# Patient Record
Sex: Male | Born: 1940
Health system: Southern US, Community
[De-identification: ages and names within clinical notes are randomized; demographics above are authoritative.]

## PROBLEM LIST (undated history)

## (undated) DIAGNOSIS — G709 Myoneural disorder, unspecified: Secondary | ICD-10-CM

## (undated) DIAGNOSIS — G4730 Sleep apnea, unspecified: Secondary | ICD-10-CM

## (undated) DIAGNOSIS — F0390 Unspecified dementia without behavioral disturbance: Secondary | ICD-10-CM

## (undated) DIAGNOSIS — H02409 Unspecified ptosis of unspecified eyelid: Secondary | ICD-10-CM

## (undated) DIAGNOSIS — R42 Dizziness and giddiness: Secondary | ICD-10-CM

## (undated) DIAGNOSIS — G629 Polyneuropathy, unspecified: Secondary | ICD-10-CM

## (undated) DIAGNOSIS — I10 Essential (primary) hypertension: Secondary | ICD-10-CM

## (undated) DIAGNOSIS — E785 Hyperlipidemia, unspecified: Secondary | ICD-10-CM

## (undated) DIAGNOSIS — E079 Disorder of thyroid, unspecified: Secondary | ICD-10-CM

## (undated) DIAGNOSIS — K219 Gastro-esophageal reflux disease without esophagitis: Secondary | ICD-10-CM

## (undated) DIAGNOSIS — R531 Weakness: Secondary | ICD-10-CM

## (undated) HISTORY — DX: Sleep apnea, unspecified: G47.30

## (undated) HISTORY — PX: TOTAL KNEE ARTHROPLASTY: SHX125

## (undated) HISTORY — PX: TONSILLECTOMY AND ADENOIDECTOMY: SHX28

## (undated) HISTORY — DX: Myoneural disorder, unspecified: G70.9

## (undated) HISTORY — DX: Gastro-esophageal reflux disease without esophagitis: K21.9

## (undated) HISTORY — DX: Unspecified ptosis of unspecified eyelid: H02.409

## (undated) HISTORY — DX: Dizziness and giddiness: R42

## (undated) HISTORY — PX: CYST REMOVAL HAND: SHX6279

## (undated) HISTORY — PX: THROAT SURGERY: SHX803

## (undated) HISTORY — DX: Unspecified dementia, unspecified severity, without behavioral disturbance, psychotic disturbance, mood disturbance, and anxiety: F03.90

## (undated) HISTORY — DX: Weakness: R53.1

## (undated) HISTORY — PX: LARYNX SURGERY: SHX692

---

## 2010-07-31 LAB — HM COLONOSCOPY

## 2011-10-12 DIAGNOSIS — H02409 Unspecified ptosis of unspecified eyelid: Secondary | ICD-10-CM

## 2011-10-12 HISTORY — DX: Unspecified ptosis of unspecified eyelid: H02.409

## 2011-11-08 LAB — PSA

## 2013-05-27 ENCOUNTER — Emergency Department (HOSPITAL_BASED_OUTPATIENT_CLINIC_OR_DEPARTMENT_OTHER)
Admission: EM | Admit: 2013-05-27 | Discharge: 2013-05-27 | Disposition: A | Discharge status: Home / Self Care | Payer: Medicare Other | Attending: Emergency Medicine | Admitting: Emergency Medicine

## 2013-05-27 ENCOUNTER — Encounter (HOSPITAL_BASED_OUTPATIENT_CLINIC_OR_DEPARTMENT_OTHER): Payer: Self-pay | Admitting: *Deleted

## 2013-05-27 ENCOUNTER — Emergency Department (HOSPITAL_BASED_OUTPATIENT_CLINIC_OR_DEPARTMENT_OTHER): Payer: Medicare Other

## 2013-05-27 DIAGNOSIS — H9209 Otalgia, unspecified ear: Secondary | ICD-10-CM | POA: Insufficient documentation

## 2013-05-27 DIAGNOSIS — Z79899 Other long term (current) drug therapy: Secondary | ICD-10-CM | POA: Insufficient documentation

## 2013-05-27 DIAGNOSIS — E785 Hyperlipidemia, unspecified: Secondary | ICD-10-CM | POA: Insufficient documentation

## 2013-05-27 DIAGNOSIS — G589 Mononeuropathy, unspecified: Secondary | ICD-10-CM | POA: Insufficient documentation

## 2013-05-27 DIAGNOSIS — J069 Acute upper respiratory infection, unspecified: Secondary | ICD-10-CM | POA: Insufficient documentation

## 2013-05-27 DIAGNOSIS — H6122 Impacted cerumen, left ear: Secondary | ICD-10-CM

## 2013-05-27 DIAGNOSIS — H9319 Tinnitus, unspecified ear: Secondary | ICD-10-CM | POA: Insufficient documentation

## 2013-05-27 DIAGNOSIS — R093 Abnormal sputum: Secondary | ICD-10-CM | POA: Insufficient documentation

## 2013-05-27 DIAGNOSIS — R6889 Other general symptoms and signs: Secondary | ICD-10-CM | POA: Insufficient documentation

## 2013-05-27 DIAGNOSIS — H6120 Impacted cerumen, unspecified ear: Secondary | ICD-10-CM | POA: Insufficient documentation

## 2013-05-27 DIAGNOSIS — IMO0001 Reserved for inherently not codable concepts without codable children: Secondary | ICD-10-CM | POA: Insufficient documentation

## 2013-05-27 DIAGNOSIS — E079 Disorder of thyroid, unspecified: Secondary | ICD-10-CM | POA: Insufficient documentation

## 2013-05-27 HISTORY — DX: Hyperlipidemia, unspecified: E78.5

## 2013-05-27 HISTORY — DX: Polyneuropathy, unspecified: G62.9

## 2013-05-27 HISTORY — DX: Disorder of thyroid, unspecified: E07.9

## 2013-05-27 MED ORDER — NEOMYCIN-POLYMYXIN-HC 3.5-10000-1 OT SOLN: 3.0000 [drp] | Freq: Three times a day (TID) | OTIC | Status: DC

## 2013-05-27 MED ORDER — CARBAMIDE PEROXIDE 6.5 % OT SOLN: 5.0000 [drp] | Freq: Two times a day (BID) | OTIC | Status: DC

## 2013-05-27 MED ORDER — DOCUSATE SODIUM 50 MG/5ML PO LIQD
ORAL | Status: AC
  Administered 2013-05-27: 10 mg
  Filled 2013-05-27: qty 10

## 2013-05-27 NOTE — ED Notes (Signed)
Pt c/o pro cough green sputum and congestion and 6 days

## 2013-05-27 NOTE — ED Notes (Signed)
Still unable to completely declot ear wax.  Again flushed and curette utilized, several small clots of ear wax removed, still unable to completely see ear drum.. Provider notified and at bedside.  Irrigated one more time and cotton ball placed in ear.

## 2013-05-27 NOTE — ED Notes (Signed)
Attempt for ear wax removal, minimal removed, placed hydrogen peroxide/sterile water approx 1 mL in left ear.

## 2013-05-27 NOTE — ED Notes (Signed)
Few small pieces of wax out with irrigation, additional peroxide mixture placed in L ear.

## 2013-05-27 NOTE — ED Provider Notes (Signed)
CSN: [id number]     Arrival date & time 05/27/13  1406 History     First MD Initiated Contact with Patient 05/27/13 1636     Chief Complaint  Patient presents with  . Cough   (Consider location/radiation/quality/duration/timing/severity/associated sxs/prior Treatment) HPI  72 year old male with past but no history of hyperlipidemia, neuropathy and thyroid disease presents the emergency department chief complaint of cough, and head congestion.  The patient states that roughly 6 days ago he began with a cough.  Cough is characterized as productive of green sputum.  He states that there were a few days in the middle where he had myalgia, and cold and hot chills but never took his temperature.  He also complains of ear fullness on the left side with some pain.  The patient denies headaches, neck stiffness, rash. He denies any nasal discharge.  He's been taking ibuprofen and Corzide and at home with relief of his symptoms.  Patient states that his symptoms are resolving somewhat and he is feeling much better than he did at a couple of days ago.  Past Medical History  Diagnosis Date  . Hyperlipemia   . Thyroid disease   . Neuropathy    History reviewed. No pertinent past surgical history. History reviewed. No pertinent family history. History  Substance Use Topics  . Smoking status: Never Smoker   . Smokeless tobacco: Not on file  . Alcohol Use: No    Review of Systems  Constitutional: Negative for chills.  HENT: Positive for ear pain, congestion and tinnitus. Negative for facial swelling, neck stiffness and ear discharge.   Eyes: Negative for discharge.  Respiratory: Positive for cough. Negative for shortness of breath and wheezing.   Gastrointestinal: Negative for vomiting.  Genitourinary: Negative for dysuria.  Musculoskeletal: Positive for myalgias.  Skin: Negative for rash.  Neurological: Negative for dizziness and light-headedness.  Hematological: Negative for adenopathy.     Allergies  Review of patient's allergies indicates no known allergies.  Home Medications   Current Outpatient Rx  Name  Route  Sig  Dispense  Refill  . gabapentin (NEURONTIN) 300 MG capsule   Oral   Take 300 mg by mouth 3 (three) times daily.         Marland Kitchen levothyroxine (SYNTHROID, LEVOTHROID) 75 MCG tablet   Oral   Take 75 mcg by mouth daily before breakfast.         . simvastatin (ZOCOR) 20 MG tablet   Oral   Take 20 mg by mouth every evening.          BP 168/81  Pulse 73  Temp(Src) 98.7 F (37.1 C) (Oral)  Resp 16  Ht 5\' 10"  (1.778 m)  Wt 180 lb (81.647 kg)  BMI 25.83 kg/m2  SpO2 97% Physical Exam  Nursing note and vitals reviewed. Constitutional: He appears well-developed and well-nourished. No distress.  HENT:  Head: Normocephalic and atraumatic.  Mouth/Throat: Oropharynx is clear and moist. No oropharyngeal exudate.  Right TM normal.  Left ear canal with cerumen impaction.  No mastoid or tragal tenderness bilaterally  Eyes: Conjunctivae are normal. No scleral icterus.  Neck: Normal range of motion. Neck supple.  Cardiovascular: Normal rate, regular rhythm and normal heart sounds.   Pulmonary/Chest: Effort normal and breath sounds normal. No respiratory distress.  Abdominal: Soft. There is no tenderness.  Musculoskeletal: Normal range of motion. He exhibits no edema.  Lymphadenopathy:    He has no cervical adenopathy.  Neurological: He is alert.  Skin: Skin is  warm and dry. He is not diaphoretic.  Psychiatric: His behavior is normal.    ED Course   Procedures (including critical care time)  Labs Reviewed - No data to display Dg Chest 2 View  05/27/2013   *RADIOLOGY REPORT*  Clinical Data: Cough, congestion for 2-3 days  CHEST - 2 VIEW  Comparison:  None  Findings: Mild cardiac enlargement.  Vascular pattern is normal. Lungs are clear.  IMPRESSION: No acute abnormalities   Original Report Authenticated By: Esperanza Heir, M.D.   1. URI (upper  respiratory infection)   2. Cerumen impaction, left     MDM  7:01 PM Filed Vitals:   05/27/13 1833  BP: 168/81  Pulse: 73  Temp:   Resp: 16   Patient with symptoms of URI likely viral in origin.  He also has cerumen impaction of the left ear.  Colace was placed in the ear, pressure or irrigation was tried and curette removal of wax.  We are unable to fully disimpact the ear.  As the canal became.  He didn't was bleeding somewhat.  The patient will be discharged him with debrox ear solution follow up with ENT.  Pt CXR negative for acute infiltrate. Patients symptoms are consistent with URI, likely viral etiology. Discussed that antibiotics are not indicated for viral infections. Pt will be discharged with symptomatic treatment.  Verbalizes understanding and is agreeable with plan. Pt is hemodynamically stable & in NAD prior to dc.   Arthor Captain, PA-C 05/27/13 1909

## 2013-05-27 NOTE — ED Provider Notes (Signed)
Medical screening examination/treatment/procedure(s) were performed by non-physician practitioner and as supervising physician I was immediately available for consultation/collaboration.   Charles B. Bernette Mayers, MD 05/27/13 [phone]

## 2013-12-03 ENCOUNTER — Ambulatory Visit: Payer: Medicare Other | Admitting: Neurology

## 2013-12-12 ENCOUNTER — Encounter: Payer: Self-pay | Admitting: Neurology

## 2013-12-12 ENCOUNTER — Ambulatory Visit (INDEPENDENT_AMBULATORY_CARE_PROVIDER_SITE_OTHER): Payer: Medicare Other | Admitting: Neurology

## 2013-12-12 VITALS — BP 126/76 | HR 83

## 2013-12-12 DIAGNOSIS — H532 Diplopia: Secondary | ICD-10-CM

## 2013-12-12 DIAGNOSIS — R5381 Other malaise: Secondary | ICD-10-CM

## 2013-12-12 DIAGNOSIS — G589 Mononeuropathy, unspecified: Secondary | ICD-10-CM

## 2013-12-12 DIAGNOSIS — R531 Weakness: Secondary | ICD-10-CM | POA: Insufficient documentation

## 2013-12-12 DIAGNOSIS — H02409 Unspecified ptosis of unspecified eyelid: Secondary | ICD-10-CM | POA: Insufficient documentation

## 2013-12-12 DIAGNOSIS — R5383 Other fatigue: Secondary | ICD-10-CM

## 2013-12-12 DIAGNOSIS — G629 Polyneuropathy, unspecified: Secondary | ICD-10-CM | POA: Insufficient documentation

## 2013-12-12 DIAGNOSIS — E785 Hyperlipidemia, unspecified: Secondary | ICD-10-CM

## 2013-12-12 DIAGNOSIS — R269 Unspecified abnormalities of gait and mobility: Secondary | ICD-10-CM

## 2013-12-12 MED ORDER — PYRIDOSTIGMINE BROMIDE 60 MG PO TABS: ORAL_TABLET | ORAL | Status: DC

## 2013-12-12 NOTE — Progress Notes (Signed)
PATIENT: Ian Gibbs DOB: 10/09/1941  HISTORICAL  Ian Gibbs 73 yo RH WM with wife, is referred by his primary care physician Dr. Morrie Sheldon for evaluation of peripheral neuropathy, ptosis, blurry vision, dizziness, generalized weakness  He had past medical history of hypothyroidism, hyperlipidemia  [place] in [place], he developed gradual onset difficulty talking, his voice varies from high pitch to lower  pitch, he also has mild dysphagia, he came back was treated at the [facility] by ENT, was diagnosed that he has bilateral vocal cord paralysis, had surgery, which helped his symptoms some  In 2012, he noticed bilateral feet discomfort, numbness tingling, burning discomfort, getting worse after bearing weight, gradually getting worse, he was evaluated [facility] Dr. Ellender Hose, reported abnormal EMG nerve conduction study consistent with peripheral neuropathy, notes also indicate extensive laboratory evaluation, including vitamin B1, IFEP was normal,  Over the years, his symptoms has been controlled with titrating dose of gabapentin, currently he is taking 600 mg 3 times a day.   In 2013, he also developed generalized weakness, intermittent ptosis, occasionally gait difficulty, getting worse over past 1 year, over past 6 months, he noticed a worsening ptosis, also intermittent double vision, especially with prolonged bleeding, speech is slurred, mild swallowing difficulties, gait difficulty, he also complains of dizziness, lightheadedness after prolonged standing,  REVIEW OF SYSTEMS: Full 14 system review of systems performed and notable only for fatigue, hearing loss, ringing ears, spinning sensation, blurred vision, snoring, joint pain, runny nose, memory loss, headache, numbness, weakness, dizziness, snoring, depression, decreased energy, disinterested in activities   ALLERGIES: No Known Allergies  HOME MEDICATIONS: Current Outpatient  Prescriptions on File Prior to Visit  Medication Sig Dispense Refill  . gabapentin (NEURONTIN) 300 MG capsule Take 300 mg by mouth 3 (three) times daily.      Marland Kitchen levothyroxine (SYNTHROID, LEVOTHROID) 75 MCG tablet Take 75 mcg by mouth daily before breakfast.      . simvastatin (ZOCOR) 20 MG tablet Take 20 mg by mouth every evening.         PAST MEDICAL HISTORY: Past Medical History  Diagnosis Date  . Hyperlipemia   . Thyroid disease   . Neuropathy     PAST SURGICAL HISTORY: No past surgical history on file.  FAMILY HISTORY: No family history on file.  SOCIAL HISTORY:  History   Social History  . Marital Status: Married    Spouse Name: N/A    Number of Children: N/A  . Years of Education: N/A   Occupational History  . Retired..   Social History Main Topics  . Smoking status: Never Smoker   . Smokeless tobacco: Not on file  . Alcohol Use: No  . Drug Use: Not on file  . Sexual Activity: Not on file   Other Topics Concern  . Not on file   Social History Narrative  . No narrative on file   PHYSICAL EXAM   Filed Vitals:   12/12/13 1325  BP: 126/76  Pulse: 83    There is no weight on file to calculate BMI.   Generalized: In no acute distress  Neck: Supple, no carotid bruits   Cardiac: Regular rate rhythm  Pulmonary: Clear to auscultation bilaterally  Musculoskeletal: No deformity  Neurological examination  Mentation: Alert oriented to time, place, history taking, and causual conversation, He has soft mild slurred speech   Cranial nerve II-XII: Pupils were equal round reactive to light. Extraocular movements were full.  Cover  and uncover testing demonstrated  bilateral exophoria. Visual field were full on confrontational test. Bilateral fundi were sharp.  Facial sensation  were normal.  he has moderate bilateral eye-closure, cheek puff weakness, left ptosis covering the upper edge of left pupil, fatigable  Hearing was intact to finger rubbing  bilaterally. Uvula tongue midline.  Head turning and shoulder shrug and were normal and symmetric.Tongue protrusion into cheek strength was normal.  Motor: he has mild neck flexion weakness, fatigable bilateral shoulder abduction, external rotation, hip flexion multiple morrhuate weakness   Sensory:  length dependent decreased  fine touch, pinprick above ankle level , decreased toe  vibratory sensation  Coordination: Normal finger to nose, heel-to-shin bilaterally there was no truncal ataxia  Gait:  he could not get up from seated position arm across, need to push down chair arm, stoop forward, cautious, unsteady gait  Could not stand up on his heels, but tiptoe,   Romberg signs: Negative  Deep tendon reflexes: Brachioradialis 2/2, biceps 2/2, triceps 2/2, patellar 2/2, Achilles  trace , plantar responses were flexor bilaterally.   DIAGNOSTIC DATA (LABS, IMAGING, TESTING) - I reviewed patient records, labs, notes, testing and imaging myself where available.  ASSESSMENT AND PLAN  Ian Gibbs is a 73 y.o. male Presenting with gradual onset Generalized weakness, on examination, he has evidence of length dependent sensory changes, consistent with peripheral neuropathy, the most striking abnormality is fatigable left ptosis, moderate bulbar and limb muscle weakness, most suggestive of myasthenia gravis.  1. laboratory evaluation, including acetylcholine receptor antibody  2.MRI of brain  3 EMG nerve conduction study  4.  he also complains of dizziness, I have suggested cutting back gabapentin to 300 mg every night instead of 3 times a day .     5 Mestinon 60 mg half to one tablet 3 times a day 6 return to clinic in 2 weeks.     Marcial Pacas, M.D. Ph.D.  [facility] [address] [place], [place] [address] [phone]

## 2013-12-17 ENCOUNTER — Telehealth: Payer: Self-pay | Admitting: Neurology

## 2013-12-17 ENCOUNTER — Ambulatory Visit (INDEPENDENT_AMBULATORY_CARE_PROVIDER_SITE_OTHER): Payer: Medicare Other | Admitting: Neurology

## 2013-12-17 ENCOUNTER — Encounter (INDEPENDENT_AMBULATORY_CARE_PROVIDER_SITE_OTHER): Payer: Self-pay

## 2013-12-17 DIAGNOSIS — H532 Diplopia: Secondary | ICD-10-CM

## 2013-12-17 DIAGNOSIS — H02409 Unspecified ptosis of unspecified eyelid: Secondary | ICD-10-CM

## 2013-12-17 DIAGNOSIS — G589 Mononeuropathy, unspecified: Secondary | ICD-10-CM

## 2013-12-17 DIAGNOSIS — R531 Weakness: Secondary | ICD-10-CM

## 2013-12-17 DIAGNOSIS — R5381 Other malaise: Secondary | ICD-10-CM

## 2013-12-17 DIAGNOSIS — G629 Polyneuropathy, unspecified: Secondary | ICD-10-CM

## 2013-12-17 DIAGNOSIS — Z0289 Encounter for other administrative examinations: Secondary | ICD-10-CM

## 2013-12-17 DIAGNOSIS — E785 Hyperlipidemia, unspecified: Secondary | ICD-10-CM

## 2013-12-17 DIAGNOSIS — R269 Unspecified abnormalities of gait and mobility: Secondary | ICD-10-CM

## 2013-12-17 DIAGNOSIS — R5383 Other fatigue: Secondary | ICD-10-CM

## 2013-12-17 NOTE — Progress Notes (Signed)
Quick Note:  Called to share normal labs thru VM message ______

## 2013-12-17 NOTE — Telephone Encounter (Signed)
Patient's daughter called upset because she wanted to know why I told the patient to call his insurance company about the lab test.  I explained to her that it wasn't to check with his insurance but to call [facility], even though I had already called and was told that Medicare would cover it.  It is a double check to make sure the patient doesn't have an out of pocket expense.  I explained to her that I sat with him to make sure he understood, I asked him if he had any questions, and to call the office if he did.  She thanked me afterward and I told her it's best if he has a POA or someone come with him in the future to be assured he understands.

## 2013-12-17 NOTE — Telephone Encounter (Signed)
Pt's wife called.  She stated that during the office visit Dr Krista Blue stated that she wanted the Pt to have an MRI and for them to come back in after the MRI before the 26th and her trip to [place].  Transferred the call to [facility] to see if they can schedule the Pt's MRI but told Mrs. Bollard that I would send the message back so that a visit could be scheduled before March 26th.  Please call Pt to advise when an appointment can be scheduled.  Thank you

## 2013-12-18 LAB — CBC WITH DIFFERENTIAL
BASOS ABS: 0 10*3/uL (ref 0.0–0.2)
Basos: 0 %
EOS: 2 %
Eosinophils Absolute: 0.1 10*3/uL (ref 0.0–0.4)
HCT: 40.6 % (ref 37.5–51.0)
Hemoglobin: 14.4 g/dL (ref 12.6–17.7)
IMMATURE GRANS (ABS): 0 10*3/uL (ref 0.0–0.1)
IMMATURE GRANULOCYTES: 0 %
Lymphocytes Absolute: 2.6 10*3/uL (ref 0.7–3.1)
Lymphs: 33 %
MCH: 31.7 pg (ref 26.6–33.0)
MCHC: 35.5 g/dL (ref 31.5–35.7)
MCV: 89 fL (ref 79–97)
MONOCYTES: 8 %
MONOS ABS: 0.6 10*3/uL (ref 0.1–0.9)
NEUTROS PCT: 57 %
Neutrophils Absolute: 4.5 10*3/uL (ref 1.4–7.0)
PLATELETS: 202 10*3/uL (ref 150–379)
RBC: 4.54 x10E6/uL (ref 4.14–5.80)
RDW: 13.5 % (ref 12.3–15.4)
WBC: 7.8 10*3/uL (ref 3.4–10.8)

## 2013-12-18 LAB — COMPREHENSIVE METABOLIC PANEL
ALK PHOS: 62 IU/L (ref 39–117)
ALT: 23 IU/L (ref 0–44)
AST: 26 IU/L (ref 0–40)
Albumin/Globulin Ratio: 1.7 (ref 1.1–2.5)
Albumin: 4.5 g/dL (ref 3.5–4.8)
BUN / CREAT RATIO: 18 (ref 10–22)
BUN: 18 mg/dL (ref 8–27)
CHLORIDE: 99 mmol/L (ref 97–108)
CO2: 23 mmol/L (ref 18–29)
Calcium: 9.1 mg/dL (ref 8.6–10.2)
Creatinine, Ser: 0.98 mg/dL (ref 0.76–1.27)
GFR calc Af Amer: 87 mL/min/{1.73_m2} (ref >59)
GFR calc non Af Amer: 75 mL/min/{1.73_m2} (ref >59)
Globulin, Total: 2.7 g/dL (ref 1.5–4.5)
Glucose: 99 mg/dL (ref 65–99)
POTASSIUM: 4.5 mmol/L (ref 3.5–5.2)
SODIUM: 141 mmol/L (ref 134–144)
Total Bilirubin: 0.5 mg/dL (ref 0.0–1.2)
Total Protein: 7.2 g/dL (ref 6.0–8.5)

## 2013-12-18 LAB — ANA W/REFLEX IF POSITIVE: Anti Nuclear Antibody(ANA): NEGATIVE

## 2013-12-18 LAB — SEDIMENTATION RATE: SED RATE: 13 mm/h (ref 0–30)

## 2013-12-18 LAB — THYROID PANEL WITH TSH
FREE THYROXINE INDEX: 3.1 (ref 1.2–4.9)
T3 Uptake Ratio: 31 % (ref 24–39)
T4 TOTAL: 10 ug/dL (ref 4.5–12.0)
TSH: 2.74 u[IU]/mL (ref 0.450–4.500)

## 2013-12-18 LAB — C-REACTIVE PROTEIN: CRP: 1.4 mg/L (ref 0.0–4.9)

## 2013-12-18 LAB — FOLATE: Folate: >19.9 ng/mL (ref >3.0)

## 2013-12-18 LAB — ACETYLCHOLINE RECEPTOR, BINDING: AChR Binding Ab, Serum: <0.03 nmol/L (ref 0.00–0.24)

## 2013-12-18 LAB — RPR: SYPHILIS RPR SCR: NONREACTIVE

## 2013-12-18 LAB — VITAMIN B12: Vitamin B-12: 693 pg/mL (ref 211–946)

## 2013-12-18 LAB — ACETYLCHOLINE RECEPTOR, MODULATING

## 2013-12-18 NOTE — Telephone Encounter (Signed)
Called patient and spoke to him patient goes for his MRI 12-19-2013 . And he has a follow up with Dr.Yan 12-24-2013.

## 2013-12-18 NOTE — Telephone Encounter (Signed)
Called patient and left him a voice mail to call me  Jayme Cloud - Dr.Yan assist  .so we can get him scheduled  for follow up with Dr.Yan before she goes to [place].

## 2013-12-18 NOTE — Telephone Encounter (Signed)
Hinton Dyer forwarding to you to check on status of MRI and to get appointment with Dr. Krista Blue before she leaves.

## 2013-12-19 ENCOUNTER — Telehealth: Payer: Self-pay | Admitting: Neurology

## 2013-12-19 ENCOUNTER — Ambulatory Visit
Admission: RE | Admit: 2013-12-19 | Discharge: 2013-12-19 | Disposition: A | Discharge status: Home / Self Care | Payer: Medicare Other | Source: Ambulatory Visit | Attending: Neurology | Admitting: Neurology

## 2013-12-19 DIAGNOSIS — R269 Unspecified abnormalities of gait and mobility: Secondary | ICD-10-CM

## 2013-12-19 DIAGNOSIS — H02409 Unspecified ptosis of unspecified eyelid: Secondary | ICD-10-CM

## 2013-12-19 DIAGNOSIS — E785 Hyperlipidemia, unspecified: Secondary | ICD-10-CM

## 2013-12-19 DIAGNOSIS — H532 Diplopia: Secondary | ICD-10-CM

## 2013-12-19 DIAGNOSIS — R531 Weakness: Secondary | ICD-10-CM

## 2013-12-19 DIAGNOSIS — G629 Polyneuropathy, unspecified: Secondary | ICD-10-CM

## 2013-12-19 NOTE — Telephone Encounter (Signed)
I spoke to patient and explained that Hinton Dyer was calling to give him his follow up appointment with Dr. Krista Blue after his MRI which is scheduled today and that she had already spoke to him about that.  I asked him to repeat to me the appointment dates and he did.  I also asked if he wanted me to call his wife with the information and he said he would pass it on.

## 2013-12-19 NOTE — Telephone Encounter (Signed)
Pt returned [facility]. Call I advised pt she was in a room with a pt but that I would have her return his call. Please call pt back concerning getting pt schedule with Dr. Krista Blue before she leaves out.

## 2013-12-21 NOTE — Procedures (Signed)
   NCS (NERVE CONDUCTION STUDY) WITH EMG (ELECTROMYOGRAPHY) REPORT   STUDY DATE: March 9th 2015 PATIENT NAME: Ian Gibbs DOB: Sep 24, 1941 MRN: [id number]    TECHNOLOGIST: Laretta Alstrom ELECTROMYOGRAPHER: Marcial Pacas M.D.  CLINICAL INFORMATION:   73 years old gentleman, with a year history of intermittent left ptosis, diplopia, generalized fatigue, on examination, he has moderate bilateral eye-closure, cheek puff weakness, also has mild to moderate bilateral upper and the lower extremity proximal muscle weakness.  FINDINGS: NERVE CONDUCTION STUDY: Bilateral peroneal sensory responses were absent. Right tibial motor response was absent. Left tibial motor response showed severely decreased CMAP amplitude. Bilateral peroneal to EDB motor response showed moderately to severely decreased C. map amplitude, with low normal conduction velocity.  Right median sensory and motor responses were normal  Stimulating right accessory nerve at Erb's point on the right side, recording at right upper trapezius muscle, perform serial repetitive nerve stimulation 3 Hz.  Baseline CMAP amplitude was 15.3 mV. there was no significant repetitive stimulation,  After right shoulder shrugging, repetitive stimulation was performed at postexercise 1,2, 3, 4 minutes, there was no significant decrement noticed  NEEDLE ELECTROMYOGRAPHY: Selected needle examination was performed at right lower extremity muscles, and right lumbosacral paraspinal muscles  Needle examination of right tibialis anterior, tibialis posterior, medial gastrocnemius, vastus lateralis, biceps femoris long head, was normal  There was no spontaneous activity at right lumbosacral paraspinal muscles, right L4, L5, S1  IMPRESSION:   This is an abnormal study. There is electrodiagnostic evidence of length dependent mild axonal sensorimotor polyneuropathy, there is no evidence of right lumbosacral radiculopathy. There is no evidence of  neuromuscular junctional disorder based on repetitive nerve stimulation of right accessory nerve, recording at right upper trapezius     INTERPRETING PHYSICIAN:   Marcial Pacas M.D. Ph.D. [facility] [address], [place], [place] [address] [phone]

## 2013-12-24 ENCOUNTER — Ambulatory Visit (INDEPENDENT_AMBULATORY_CARE_PROVIDER_SITE_OTHER): Payer: Medicare Other | Admitting: Neurology

## 2013-12-24 ENCOUNTER — Other Ambulatory Visit: Payer: Self-pay | Admitting: Neurology

## 2013-12-24 ENCOUNTER — Encounter: Payer: Self-pay | Admitting: Neurology

## 2013-12-24 VITALS — BP 119/75 | HR 83 | Ht 70.0 in | Wt 183.0 lb

## 2013-12-24 DIAGNOSIS — E785 Hyperlipidemia, unspecified: Secondary | ICD-10-CM

## 2013-12-24 DIAGNOSIS — H532 Diplopia: Secondary | ICD-10-CM

## 2013-12-24 DIAGNOSIS — R5383 Other fatigue: Secondary | ICD-10-CM

## 2013-12-24 DIAGNOSIS — R531 Weakness: Secondary | ICD-10-CM

## 2013-12-24 DIAGNOSIS — G629 Polyneuropathy, unspecified: Secondary | ICD-10-CM

## 2013-12-24 DIAGNOSIS — R5381 Other malaise: Secondary | ICD-10-CM

## 2013-12-24 DIAGNOSIS — H02409 Unspecified ptosis of unspecified eyelid: Secondary | ICD-10-CM

## 2013-12-24 DIAGNOSIS — R269 Unspecified abnormalities of gait and mobility: Secondary | ICD-10-CM

## 2013-12-24 DIAGNOSIS — G709 Myoneural disorder, unspecified: Secondary | ICD-10-CM

## 2013-12-24 DIAGNOSIS — G589 Mononeuropathy, unspecified: Secondary | ICD-10-CM

## 2013-12-24 NOTE — Progress Notes (Signed)
PATIENT: Ian Gibbs DOB: [birth year]  HISTORICAL  Ian Gibbs 73 yo RH WM with wife, is referred by his primary care physician Dr. Morrie Sheldon for evaluation of peripheral neuropathy, ptosis, blurry vision, dizziness, generalized weakness  He had past medical history of hypothyroidism, hyperlipidemia  While [place] as a [place] in [place], he developed gradual onset difficulty talking, his voice varies from high pitch to lower  pitch, he also has mild dysphagia, he came back was treated at the [facility] by ENT, was diagnosed that he has bilateral vocal cord paralysis, had surgery, which helped his symptoms some  In 2012, he noticed bilateral feet discomfort, numbness tingling, burning discomfort, getting worse after bearing weight, gradually getting worse, he was evaluated by [facility] Dr. Ellender Hose, reported abnormal EMG nerve conduction study consistent with peripheral neuropathy, notes also indicate extensive laboratory evaluation, including vitamin B1, IFEP was normal,  Over the years, his symptoms has been controlled with titrating dose of gabapentin, currently he is taking 600 mg 3 times a day.   In 2013, he also developed generalized weakness, intermittent ptosis, occasionally gait difficulty, getting worse over past 1 year, over past 6 months, he noticed a worsening ptosis, also intermittent double vision, especially with prolonged bleeding, speech is slurred, mild swallowing difficulties, gait difficulty, he also complains of dizziness, lightheadedness after prolonged standing,  UPDATE March 16th 2015:  MRI of the brain showed mild to moderate periventricular white matter disease, moderate atrophy Laboratory showed normal or Negative RPR, B12, C. reactive protein, folic acid, TSH, ESR, ANA, acetylcholine receptor antibody, EMG nerve conduction study showed length dependent mild axonal peripheral neuropathy, repetitive stimulation of right accessory  nerve, recording at right upper trapezius showed no abnormalities.  He continue complains of fatigue, lack of stamina, with prolonged walking, he complains of low back pain, bilateral calf muscle tightness, achiness, he is taking Mestinon 60 mg 3 times a day, tolerating it well, but there was no significant improvement   REVIEW OF SYSTEMS: Full 14 system review of systems performed and notable only for hearing loss, ringing ears, runny nose, trouble swallowing, double vision, daytime sleepiness, snoring, joint pain, walking difficulties, memory loss, dizziness, numbness, speech difficulty, weakness, facial droopy, decreased concentration  ALLERGIES: No Known Allergies  HOME MEDICATIONS: Current Outpatient Prescriptions on File Prior to Visit  Medication Sig Dispense Refill  . gabapentin (NEURONTIN) 300 MG capsule Take 300 mg by mouth 3 (three) times daily.      Marland Kitchen levothyroxine (SYNTHROID, LEVOTHROID) 75 MCG tablet Take 75 mcg by mouth daily before breakfast.      . simvastatin (ZOCOR) 20 MG tablet Take 20 mg by mouth every evening.         PAST MEDICAL HISTORY: Past Medical History  Diagnosis Date  . Hyperlipemia   . Thyroid disease   . Neuropathy     PAST SURGICAL HISTORY: Past Surgical History  Procedure Laterality Date  . Tonsillectomy and adenoidectomy    . Cyst removal hand    . Throat surgery    . Larynx surgery      FAMILY HISTORY: Family History  Problem Relation Age of Onset  . Seizures Mother   . Cancer - Other Father     SOCIAL HISTORY:  History   Social History  . Marital Status: Married    Spouse Name: N/A    Number of Children: N/A  . Years of Education: N/A   Occupational History  . Retired..   Social History Main Topics  .  Smoking status: Never Smoker   . Smokeless tobacco: Not on file  . Alcohol Use: No  . Drug Use: Not on file  . Sexual Activity: Not on file   Other Topics Concern  . Not on file   Social History Narrative  . No  narrative on file   PHYSICAL EXAM   Filed Vitals:   12/24/13 1409  BP: 119/75  Pulse: 83  Height: 5' 10"  (1.778 m)  Weight: 183 lb (83.008 kg)    Body mass index is 26.26 kg/(m^2).   Generalized: In no acute distress  Neck: Supple, no carotid bruits   Cardiac: Regular rate rhythm  Pulmonary: Clear to auscultation bilaterally  Musculoskeletal: No deformity  Neurological examination  Mentation: Alert oriented to time, place, history taking, and causual conversation, He has soft mild slurred speech   Cranial nerve II-XII: Pupils were equal round reactive to light. Extraocular movements were full.  Cover and uncover testing demonstrated  bilateral exophoria. Visual field were full on confrontational test. Red lense testing has demonstrated bilateral medial rectus weakness, and right superior rectus, left inferior oblique muscle weakness. Bilateral fundi were sharp.  Facial sensation  were normal.  he has moderate bilateral eye-closure, cheek puff weakness, left ptosis covering the upper edge of left pupil, fatigable  Hearing was intact to finger rubbing bilaterally. Uvula tongue midline.  Head turning and shoulder shrug and were normal and symmetric.Tongue protrusion into cheek strength was normal.  Motor: he has mild neck flexion weakness, fatigable bilateral shoulder abduction, external rotation, mild bilateral hip flexion weakness, he also has mild right more than left limb muscle rigidity, decreased facial expression   Sensory:  length dependent decreased  fine touch, pinprick above ankle level , decreased toe  vibratory sensation  Coordination: Normal finger to nose, heel-to-shin bilaterally there was no truncal ataxia  Gait:  he could not get up from seated position arm across, need to push down chair arm, stoop forward, cautious, unsteady gait  Could not stand up on his heels, but tiptoe,   Romberg signs: Negative  Deep tendon reflexes: Brachioradialis 2/2, biceps 2/2,  triceps 2/2, patellar 2/2, Achilles  trace , plantar responses were flexor bilaterally.   DIAGNOSTIC DATA (LABS, IMAGING, TESTING) - I reviewed patient records, labs, notes, testing and imaging myself where available.  ASSESSMENT AND PLAN  Aasim Restivo is a 73 y.o. male Presenting with gradual onset Generalized weakness, on examination, he has evidence of length dependent sensory changes, consistent with peripheral neuropathy, the most striking abnormality is fatigable left ptosis, moderate bulbar and limb muscle weakness, most suggestive of myasthenia gravis. Ache receptor antibody was negative, EMG nerve conduction study has demonstrated axonal peripheral neuropathy. He complains of bilateral calf muscle achy pain with prolonged walking, he has mild parkinsonian features, orthostatic dizziness, mild right more than left limb rigidity, decreased facial expression,  Differentiation diagnosis also including lumbar radiculopathy proceed with MRI of lumbar physical therapy,  Repeat acetylcholine receptor antibody, and musk antibody Return to clinic in one month  ,     Marcial Pacas, M.D. Ph.D.  [facility] [address], [place], [place] [address] [phone]

## 2013-12-25 LAB — ATHENA MAIL

## 2014-01-01 ENCOUNTER — Ambulatory Visit
Admission: RE | Admit: 2014-01-01 | Discharge: 2014-01-01 | Disposition: A | Discharge status: Home / Self Care | Payer: Medicare Other | Source: Ambulatory Visit | Attending: Neurology | Admitting: Neurology

## 2014-01-01 DIAGNOSIS — H02409 Unspecified ptosis of unspecified eyelid: Secondary | ICD-10-CM

## 2014-01-01 DIAGNOSIS — R5381 Other malaise: Secondary | ICD-10-CM

## 2014-01-01 DIAGNOSIS — G629 Polyneuropathy, unspecified: Secondary | ICD-10-CM

## 2014-01-01 DIAGNOSIS — R531 Weakness: Secondary | ICD-10-CM

## 2014-01-01 DIAGNOSIS — R269 Unspecified abnormalities of gait and mobility: Secondary | ICD-10-CM

## 2014-01-01 DIAGNOSIS — R5383 Other fatigue: Secondary | ICD-10-CM

## 2014-01-01 DIAGNOSIS — E785 Hyperlipidemia, unspecified: Secondary | ICD-10-CM

## 2014-01-01 DIAGNOSIS — H532 Diplopia: Secondary | ICD-10-CM

## 2014-01-01 DIAGNOSIS — G709 Myoneural disorder, unspecified: Secondary | ICD-10-CM

## 2014-01-03 NOTE — Progress Notes (Signed)
Quick Note:  Spoke to wife and relayed CT chest results of mildly enlarged heart, otherwise normal, per Dr. Krista Blue. ______

## 2014-01-23 ENCOUNTER — Encounter: Payer: Medicare Other | Admitting: Neurology

## 2014-01-23 ENCOUNTER — Ambulatory Visit: Payer: Medicare Other | Admitting: Neurology

## 2014-01-28 ENCOUNTER — Encounter: Payer: Self-pay | Admitting: Neurology

## 2014-01-28 ENCOUNTER — Encounter (INDEPENDENT_AMBULATORY_CARE_PROVIDER_SITE_OTHER): Payer: Self-pay

## 2014-01-28 ENCOUNTER — Ambulatory Visit (INDEPENDENT_AMBULATORY_CARE_PROVIDER_SITE_OTHER): Payer: Medicare Other | Admitting: Neurology

## 2014-01-28 ENCOUNTER — Telehealth: Payer: Self-pay | Admitting: Neurology

## 2014-01-28 VITALS — BP 113/71 | HR 84 | Ht 70.0 in | Wt 183.5 lb

## 2014-01-28 DIAGNOSIS — R269 Unspecified abnormalities of gait and mobility: Secondary | ICD-10-CM

## 2014-01-28 DIAGNOSIS — E669 Obesity, unspecified: Secondary | ICD-10-CM

## 2014-01-28 DIAGNOSIS — R0683 Snoring: Secondary | ICD-10-CM

## 2014-01-28 DIAGNOSIS — R5381 Other malaise: Secondary | ICD-10-CM

## 2014-01-28 DIAGNOSIS — G4733 Obstructive sleep apnea (adult) (pediatric): Secondary | ICD-10-CM

## 2014-01-28 DIAGNOSIS — H532 Diplopia: Secondary | ICD-10-CM

## 2014-01-28 DIAGNOSIS — H02409 Unspecified ptosis of unspecified eyelid: Secondary | ICD-10-CM

## 2014-01-28 DIAGNOSIS — R5383 Other fatigue: Secondary | ICD-10-CM

## 2014-01-28 DIAGNOSIS — G609 Hereditary and idiopathic neuropathy, unspecified: Secondary | ICD-10-CM

## 2014-01-28 DIAGNOSIS — R531 Weakness: Secondary | ICD-10-CM

## 2014-01-28 DIAGNOSIS — R400 Somnolence: Secondary | ICD-10-CM

## 2014-01-28 NOTE — Progress Notes (Signed)
PATIENT: Ian Gibbs DOB: 08-Jan-1941  HISTORICAL  Ian Gibbs 73 yo RH WM with wife, is referred by his primary care physician Dr. Morrie Sheldon for evaluation of peripheral neuropathy, ptosis, blurry vision, dizziness, generalized weakness, Initial visit, March 2015.   He had past medical history of hypothyroidism, hyperlipidemia  [place], he developed gradual onset difficulty talking, his voice varies from high pitch to low, he also has mild dysphagia, [place] was treated at the [facility] by ENT, was diagnosed with bilateral vocal cord paralysis of unknown etiology, had surgery, which helped his symptoms some  In 2012, he noticed bilateral feet discomfort, numbness tingling, burning discomfort, getting worse after bearing weight, gradually getting worse, he was [facility] Dr. Ellender Hose, reported abnormal EMG nerve conduction study consistent with peripheral neuropathy, notes also reported extensive laboratory evaluation, including vitamin B1, IFEP was normal,  Over the years, his symptoms has been controlled with titrating dose of gabapentin, currently he is taking 600 mg 3 times a day.   In 2013, he also developed generalized weakness, intermittent ptosis, occasionally gait difficulty, getting worse over past 1 year, over past 6 months, he noticed worsening ptosis, also intermittent double vision, especially with prolonged reading, speech is slurred, mild swallowing difficulties, gait difficulty, he also complains of dizziness, lightheadedness after prolonged standing,   MRI of the brain showed mild to moderate periventricular white matter disease, moderate atrophy  Laboratory showed normal or Negative RPR, B12, C. reactive protein, folic acid, TSH, ESR, ANA, acetylcholine receptor antibody,  EMG nerve conduction study showed length dependent mild axonal peripheral neuropathy, repetitive  stimulation of right accessory nerve, recording at right upper trapezius showed no abnormalities.  He continue complains of fatigue, lack of stamina, with prolonged walking, he complains of low back pain, bilateral calf muscle tightness, achiness, he is taking Mestinon 60 mg 3 times a day, tolerating it well, but there was no significant improvement  UPDATE April 20rh 2015:  We went over his MRI of the lumbar, only mild degenerative disc disease, there is no significant canal, of foraminal stenosis, MRI of the brain showed moderate perisylvian fissure atrophy, mild to moderate small vessel disease, no acute lesions.  Athena diagnostic testing showed neck he musk antibody, borderline antibody acetylcholine receptor.  CT of the chest showed mild cardiomegaly, no acute lesions, no thymus pathology noticed.  He also complains of daytime sleepiness, snoring, ESS score is 12, he tends to sleep while sitting down reading, watching TV, even sitting inactive in public places, lying down rest in the afternoon, after lunch, FSS score is 47, he also complains of dry mouth, Frequent wakening at night time  He also complains of right knee pain, has failed conservative treatment, including intra-articular knee injection, is planning on having right knee replacement sometimes  REVIEW OF SYSTEMS: Full 14 system review of systems performed and notable only for activity change, fatigue, hearing loss, ringing ears, runny nose, light sensitivity, blurred vision, double vision, cough, daytime sleepiness, snoring, joint pain, memory loss, dizziness, headaches, speech difficulty, weakness, decreased concentration  ALLERGIES: No Known Allergies  HOME MEDICATIONS: Current Outpatient Prescriptions on File Prior to Visit  Medication Sig Dispense Refill  . gabapentin (NEURONTIN) 300 MG capsule Take 300 mg by mouth 3 (three) times daily.      Marland Kitchen levothyroxine (SYNTHROID, LEVOTHROID) 75 MCG tablet Take 75 mcg by mouth  daily before breakfast.      .  simvastatin (ZOCOR) 20 MG tablet Take 20 mg by mouth every evening.         PAST MEDICAL HISTORY: Past Medical History  Diagnosis Date  . Hyperlipemia   . Thyroid disease   . Neuropathy     PAST SURGICAL HISTORY: Past Surgical History  Procedure Laterality Date  . Tonsillectomy and adenoidectomy    . Cyst removal hand    . Throat surgery    . Larynx surgery      FAMILY HISTORY: Family History  Problem Relation Age of Onset  . Seizures Mother   . Cancer - Other Father     SOCIAL HISTORY:  History   Social History  . Marital Status: Married    Spouse Name: N/A    Number of Children: N/A  . Years of Education: N/A   Occupational History  . Retired..   Social History Main Topics  . Smoking status: Never Smoker   . Smokeless tobacco: Not on file  . Alcohol Use: No  . Drug Use: Not on file  . Sexual Activity: Not on file   Other Topics Concern  . Not on file   Social History Narrative  . No narrative on file   PHYSICAL EXAM   Filed Vitals:   01/28/14 0951  BP: 113/71  Pulse: 84  Height: 5' 10"  (1.778 m)  Weight: 183 lb 8 oz (83.235 kg)    Body mass index is 26.33 kg/(m^2).   Generalized: In no acute distress  Neck: Supple, no carotid bruits   Cardiac: Regular rate rhythm  Pulmonary: Clear to auscultation bilaterally  Musculoskeletal: No deformity  Neurological examination  Mentation: Alert oriented to time, place, history taking, and causual conversation, He has soft mild slurred speech decreased facial expression,   Cranial nerve II-XII: Pupils were equal round reactive to light. Extraocular movements were full.  Cover and uncover testing demonstrated bilateral exophoria. Visual field were full on confrontational test. Red lens testing has demonstrated bilateral medial rectus weakness.  Facial sensation  were normal.  he has mild to moderate moderate bilateral eye-closure, cheek puff weakness, left ptosis  covering the upper edge of left pupil, fatigable  Hearing was intact to finger rubbing bilaterally. Uvula tongue midline.  Head turning and shoulder shrug and were normal and symmetric.Tongue protrusion into cheek strength was normal.  Motor: he has mild fatigable bilateral shoulder abduction, external rotation, mild bilateral hip flexion weakness,   Sensory:  length dependent decreased to fine touch, pinprick above to mid shin level , decreased  vibratory sensation to mid shin level  Coordination: Normal finger to nose, heel-to-shin bilaterally there was no truncal ataxia  Gait:  he could not get up from seated position arm across, need to push down chair arm, stoop forward, cautious, unsteady gait, knee valgrus,  He could not stand up on his heels,     Romberg signs: Negative  Deep tendon reflexes: Brachioradialis 2/2, biceps 2/2, triceps 2/2, patellar 2/2, Achilles  trace , plantar responses were flexor bilaterally.   DIAGNOSTIC DATA (LABS, IMAGING, TESTING) - I reviewed patient records, labs, notes, testing and imaging myself where available.  ASSESSMENT AND PLAN  Ian Gibbs is a 73 y.o. male Presenting with gradual onset generalized weakness, intermittent double vision, easy fatigue, daytime sleepiness,  on examination, he has evidence of length dependent sensory changes, consistent with peripheral neuropathy, the most striking abnormality is fatigable left ptosis, moderate bulbar and limb muscle weakness, most suggestive of myasthenia gravis. Ache receptor antibody was negative,  repeat Athena diagnostics testing showed borderline acetylcholine receptor antibody, negative musk antibody.  EMG nerve conduction study has demonstrated axonal peripheral neuropathy.   1. possible serum negative generalized myasthenia gravis, possibility also including paraneoplastic syndrome, Lambert-Eaton syndrome, I will refer him to [facility] for Single fiber EMG nerve conduction study,   2.  Evidence of obstructive sleep apnea, ESS 12, FSS 47, sleep study 3. he denies significant improvement with Mestinon, will stop Mestinon 4 May consider CT of abdomen, pelvic, if [facility] hospital failed to confirm the diagnosis 5, physical therapy.    Marcial Pacas, M.D. Ph.D.  [facility] [address], [place], [place] [address] [phone]

## 2014-01-28 NOTE — Telephone Encounter (Signed)
Dr. Krista Blue.. refers patient for attended sleep study.  Height: 5'10  Weight: 183 lbs.  BMI: 26.33  Past Medical History: Hyperlipemia  Thyroid disease  Neuropathy   Sleep Symptoms: Patient complains of daytime sleepiness, snoring   Epworth Score: 12 per Dr. Rhea Belton notes  Medications:  Gabapentin (Cap) NEURONTIN 300 MG Take 300 mg by mouth 3 (three) times daily. Levothyroxine Sodium (Tab) SYNTHROID, LEVOTHROID 75 MCG Take 75 mcg by mouth daily before breakfast. Meloxicam (Tab) MOBIC 7.5 MG Take 7.5 mg by mouth daily. Pyridostigmine Bromide (Tab) MESTINON 60 MG 1/2 to one tabs three times a day Simvastatin (Tab) ZOCOR 20 MG Take 20 mg by mouth every evening.   Insurance: Estate agent  Please review patient information and submit instructions for scheduling and orders for sleep technologist.  Thank you!

## 2014-01-29 NOTE — Telephone Encounter (Signed)
Sleep study request review: This patient has an underlying medical history of obesity, HLP, PN and thyroid disease ,and is referred by Dr. Krista Blue for an attended sleep study due to a report of snoring and EDS (ESS 12). I will order a split-night sleep study and see the patient in sleep medicine consultation afterwards. [name], MD, PhD [facility])

## 2014-02-04 ENCOUNTER — Ambulatory Visit: Payer: Medicare Other | Attending: Neurology | Admitting: Physical Therapy

## 2014-02-04 DIAGNOSIS — IMO0001 Reserved for inherently not codable concepts without codable children: Secondary | ICD-10-CM | POA: Insufficient documentation

## 2014-02-04 DIAGNOSIS — R269 Unspecified abnormalities of gait and mobility: Secondary | ICD-10-CM | POA: Insufficient documentation

## 2014-02-04 DIAGNOSIS — R5381 Other malaise: Secondary | ICD-10-CM | POA: Insufficient documentation

## 2014-02-04 DIAGNOSIS — M6281 Muscle weakness (generalized): Secondary | ICD-10-CM | POA: Insufficient documentation

## 2014-02-05 ENCOUNTER — Encounter: Payer: Self-pay | Admitting: Neurology

## 2014-02-06 ENCOUNTER — Ambulatory Visit: Payer: Medicare Other | Admitting: Physical Therapy

## 2014-02-12 ENCOUNTER — Telehealth: Payer: Self-pay | Admitting: Neurology

## 2014-02-12 ENCOUNTER — Ambulatory Visit: Payer: Medicare Other | Attending: Neurology | Admitting: Physical Therapy

## 2014-02-12 DIAGNOSIS — R269 Unspecified abnormalities of gait and mobility: Secondary | ICD-10-CM | POA: Insufficient documentation

## 2014-02-12 DIAGNOSIS — M6281 Muscle weakness (generalized): Secondary | ICD-10-CM | POA: Insufficient documentation

## 2014-02-12 DIAGNOSIS — IMO0001 Reserved for inherently not codable concepts without codable children: Secondary | ICD-10-CM | POA: Insufficient documentation

## 2014-02-12 DIAGNOSIS — R5381 Other malaise: Secondary | ICD-10-CM | POA: Insufficient documentation

## 2014-02-12 NOTE — Telephone Encounter (Signed)
If you call in the afternoon please call cell number at [phone].

## 2014-02-14 NOTE — Telephone Encounter (Signed)
I tried to call patient and let him no that we will get Single Fiber EMG nerve conduction study. Patient did not answer and voice mail could not be set up. Dr.Yan please place order.

## 2014-02-14 NOTE — Telephone Encounter (Signed)
Patient returning call, gave a different number where he can be reached which is [phone]. Please return call.

## 2014-02-15 ENCOUNTER — Ambulatory Visit: Payer: Medicare Other | Admitting: Physical Therapy

## 2014-02-15 NOTE — Telephone Encounter (Signed)
Called and informed patient per Dana's note, he verbalized understanding

## 2014-02-18 ENCOUNTER — Encounter: Payer: Medicare Other | Admitting: Physical Therapy

## 2014-02-18 ENCOUNTER — Ambulatory Visit: Payer: Medicare Other | Attending: Neurology | Admitting: Physical Therapy

## 2014-02-18 DIAGNOSIS — M6281 Muscle weakness (generalized): Secondary | ICD-10-CM | POA: Insufficient documentation

## 2014-02-18 DIAGNOSIS — IMO0001 Reserved for inherently not codable concepts without codable children: Secondary | ICD-10-CM | POA: Insufficient documentation

## 2014-02-18 DIAGNOSIS — R5381 Other malaise: Secondary | ICD-10-CM | POA: Insufficient documentation

## 2014-02-18 DIAGNOSIS — R269 Unspecified abnormalities of gait and mobility: Secondary | ICD-10-CM | POA: Insufficient documentation

## 2014-02-20 ENCOUNTER — Encounter: Payer: Medicare Other | Admitting: Physical Therapy

## 2014-02-21 ENCOUNTER — Ambulatory Visit: Payer: Medicare Other | Admitting: Physical Therapy

## 2014-02-21 DIAGNOSIS — IMO0001 Reserved for inherently not codable concepts without codable children: Secondary | ICD-10-CM | POA: Diagnosis not present

## 2014-02-22 ENCOUNTER — Telehealth: Payer: Self-pay | Admitting: *Deleted

## 2014-02-22 NOTE — Telephone Encounter (Signed)
Called pt and spoke with pt's wife [name] informing her that the referral to [facility] has already been sent and I also made an earlier appt with Dr. Krista Blue on 04/25/14. I advised the wife that if the pt has any other problems, questions or concerns to call the office. Wife verbalized understanding.

## 2014-02-25 ENCOUNTER — Ambulatory Visit: Payer: Medicare Other | Admitting: *Deleted

## 2014-02-25 DIAGNOSIS — IMO0001 Reserved for inherently not codable concepts without codable children: Secondary | ICD-10-CM | POA: Diagnosis not present

## 2014-02-26 ENCOUNTER — Encounter: Payer: Medicare Other | Admitting: Physical Therapy

## 2014-02-28 ENCOUNTER — Encounter: Payer: Medicare Other | Admitting: Physical Therapy

## 2014-03-01 ENCOUNTER — Encounter: Payer: Medicare Other | Admitting: Physical Therapy

## 2014-03-05 ENCOUNTER — Encounter: Payer: Medicare Other | Admitting: Physical Therapy

## 2014-03-05 ENCOUNTER — Ambulatory Visit: Payer: Medicare Other | Admitting: *Deleted

## 2014-03-05 DIAGNOSIS — IMO0001 Reserved for inherently not codable concepts without codable children: Secondary | ICD-10-CM | POA: Diagnosis not present

## 2014-03-07 ENCOUNTER — Telehealth: Payer: Self-pay | Admitting: Neurology

## 2014-03-07 ENCOUNTER — Encounter: Payer: Medicare Other | Admitting: Physical Therapy

## 2014-03-07 NOTE — Telephone Encounter (Signed)
Butch Penny from [facility] calling with a question about patient's referral, wants to know if a neuromuscular consult is needed or just a single fiber EMG. Please return call and advise.

## 2014-03-07 NOTE — Telephone Encounter (Signed)
Spoke with Butch Penny from [facility] to inform her per Dr. Krista Blue to schedule the pt for a neuromuscular consult, along with a single fiber EMG. I advised Butch Penny that if she needs anything else to let our office know. Butch Penny verbalized understanding.

## 2014-03-08 ENCOUNTER — Ambulatory Visit (INDEPENDENT_AMBULATORY_CARE_PROVIDER_SITE_OTHER): Payer: Medicare Other | Admitting: Neurology

## 2014-03-08 ENCOUNTER — Encounter: Payer: Medicare Other | Admitting: *Deleted

## 2014-03-08 DIAGNOSIS — G4733 Obstructive sleep apnea (adult) (pediatric): Secondary | ICD-10-CM

## 2014-03-08 DIAGNOSIS — E669 Obesity, unspecified: Secondary | ICD-10-CM

## 2014-03-08 DIAGNOSIS — G4730 Sleep apnea, unspecified: Secondary | ICD-10-CM

## 2014-03-08 DIAGNOSIS — G4710 Hypersomnia, unspecified: Secondary | ICD-10-CM

## 2014-03-08 DIAGNOSIS — R400 Somnolence: Secondary | ICD-10-CM

## 2014-03-08 DIAGNOSIS — R0683 Snoring: Secondary | ICD-10-CM

## 2014-03-08 DIAGNOSIS — R9431 Abnormal electrocardiogram [ECG] [EKG]: Secondary | ICD-10-CM

## 2014-03-08 DIAGNOSIS — G609 Hereditary and idiopathic neuropathy, unspecified: Secondary | ICD-10-CM

## 2014-03-08 NOTE — Telephone Encounter (Signed)
Called pt's wife back concerning the pt's referral to [facility]. I explained to the wife that I talked with Butch Penny with [facility] on 03/07/14 concerning this matter and gave the pt's wife the information to call Butch Penny, to take care of this matter. I advised the wife that if the pt has any other problems, questions or concerns to call the office. Wife verbalized understanding.

## 2014-03-08 NOTE — Telephone Encounter (Signed)
Patient's spouse called and stated [facility] had not received order for EMG.  Please call and advise.  [phone].

## 2014-03-12 ENCOUNTER — Ambulatory Visit: Payer: Medicare Other | Attending: Neurology | Admitting: *Deleted

## 2014-03-12 DIAGNOSIS — M6281 Muscle weakness (generalized): Secondary | ICD-10-CM | POA: Diagnosis not present

## 2014-03-12 DIAGNOSIS — R269 Unspecified abnormalities of gait and mobility: Secondary | ICD-10-CM | POA: Diagnosis not present

## 2014-03-12 DIAGNOSIS — IMO0001 Reserved for inherently not codable concepts without codable children: Secondary | ICD-10-CM | POA: Diagnosis not present

## 2014-03-12 DIAGNOSIS — R5381 Other malaise: Secondary | ICD-10-CM | POA: Insufficient documentation

## 2014-03-15 ENCOUNTER — Ambulatory Visit: Payer: Medicare Other | Admitting: Physical Therapy

## 2014-03-15 DIAGNOSIS — IMO0001 Reserved for inherently not codable concepts without codable children: Secondary | ICD-10-CM | POA: Diagnosis not present

## 2014-03-18 ENCOUNTER — Ambulatory Visit: Payer: Medicare Other | Admitting: Physical Therapy

## 2014-03-18 DIAGNOSIS — IMO0001 Reserved for inherently not codable concepts without codable children: Secondary | ICD-10-CM | POA: Diagnosis not present

## 2014-03-20 ENCOUNTER — Telehealth: Payer: Self-pay | Admitting: Neurology

## 2014-03-20 DIAGNOSIS — G4733 Obstructive sleep apnea (adult) (pediatric): Secondary | ICD-10-CM

## 2014-03-20 NOTE — Telephone Encounter (Signed)
Please call and notify patient that the recent sleep study confirmed the diagnosis of OSA. He did very well with CPAP during the study with significant improvement of the respiratory events. Therefore, I would like start the patient on CPAP at home. I placed the order in the chart.   Arrange for CPAP set up at home through a DME company of patient's choice and fax/route report to PCP and referring MD (if other than PCP).   The patient will also need a follow up appointment with me in 6-8 weeks post set up that has to be scheduled; help the patient schedule this (in a follow-up slot).   Please re-enforce the importance of compliance with treatment and the need for us to monitor compliance data.   Once you have spoken to the patient and scheduled the return appointment, you may close this encounter, thanks,   Daelyn Mozer, MD, PhD [facility] ([facility])    

## 2014-03-21 ENCOUNTER — Encounter: Payer: Self-pay | Admitting: *Deleted

## 2014-03-21 NOTE — Telephone Encounter (Signed)
I called and spoke with the patient about his recent sleep study results. I informed the patient that the study confirmed the diagnosis of obstructive sleep apnea and that he did well on CPAP during the night of his study with significant improvement of his respiratory events. Dr. Rexene Alberts recommend starting CPAP therapy at home, so I will send the order to [place] who will contact the patient. I will fax a copy to Dr. Rhea Belton office and mail a copy to the patient along with a follow up instruction letter.

## 2014-03-22 ENCOUNTER — Ambulatory Visit: Payer: Medicare Other | Admitting: *Deleted

## 2014-03-22 DIAGNOSIS — IMO0001 Reserved for inherently not codable concepts without codable children: Secondary | ICD-10-CM | POA: Diagnosis not present

## 2014-04-08 ENCOUNTER — Ambulatory Visit: Payer: Medicare Other | Admitting: Physical Therapy

## 2014-04-08 DIAGNOSIS — IMO0001 Reserved for inherently not codable concepts without codable children: Secondary | ICD-10-CM | POA: Diagnosis not present

## 2014-04-25 ENCOUNTER — Ambulatory Visit (INDEPENDENT_AMBULATORY_CARE_PROVIDER_SITE_OTHER): Payer: Medicare Other | Admitting: Neurology

## 2014-04-25 ENCOUNTER — Encounter (INDEPENDENT_AMBULATORY_CARE_PROVIDER_SITE_OTHER): Payer: Self-pay

## 2014-04-25 ENCOUNTER — Encounter: Payer: Self-pay | Admitting: Neurology

## 2014-04-25 ENCOUNTER — Ambulatory Visit: Payer: Self-pay | Admitting: Neurology

## 2014-04-25 VITALS — BP 138/81 | HR 77 | Ht 70.0 in | Wt 184.0 lb

## 2014-04-25 DIAGNOSIS — R42 Dizziness and giddiness: Secondary | ICD-10-CM

## 2014-04-25 MED ORDER — PYRIDOSTIGMINE BROMIDE 60 MG PO TABS: 60.0000 mg | ORAL_TABLET | Freq: Three times a day (TID) | ORAL | Status: DC

## 2014-04-25 NOTE — Progress Notes (Signed)
PATIENT: [name] DOB: 06-17-1941  HISTORICAL  Fenton Candee 73 yo RH WM with wife, is referred by his primary care physician Dr. Morrie Sheldon for evaluation of peripheral neuropathy, ptosis, blurry vision, dizziness, generalized weakness, Initial visit, March 2015.   He had past medical history of hypothyroidism, hyperlipidemia  [place], he developed gradual onset difficulty talking, his voice varies from high pitch to low, he also has mild dysphagia, [place] was treated at the [facility] by ENT, was diagnosed with bilateral vocal cord paralysis of unknown etiology, had surgery, which helped his symptoms some  In 2012, he noticed bilateral feet discomfort, numbness tingling, burning discomfort, getting worse after bearing weight, gradually getting worse, he was evaluated by [facility]ist Dr. Ellender Hose, reported abnormal EMG nerve conduction study consistent with peripheral neuropathy, notes also reported extensive laboratory evaluation, including vitamin B1, IFEP was normal,  Over the years, his symptoms has been controlled with titrating dose of gabapentin, currently he is taking 600 mg 3 times a day.   In 2013, he also developed generalized weakness, intermittent ptosis, occasionally gait difficulty, getting worse over past 1 year, over past 6 months, he noticed worsening ptosis, also intermittent double vision, especially with prolonged reading, speech is slurred, mild swallowing difficulties, gait difficulty, he also complains of dizziness, lightheadedness after prolonged standing,  MRI of the brain showed mild to moderate periventricular white matter disease, moderate atrophy  Laboratory showed normal or Negative RPR, B12, C. reactive protein, folic acid, TSH, ESR, ANA, acetylcholine receptor antibody,  EMG nerve conduction study showed length dependent mild axonal peripheral neuropathy, repetitive  stimulation of right accessory nerve, recording at right upper trapezius showed no abnormalities.  He continue complains of fatigue, lack of stamina, with prolonged walking, he complains of low back pain, bilateral calf muscle tightness, achiness, he is taking Mestinon 60 mg 3 times a day, tolerating it well, but there was no significant improvement  MRI of the lumbar, only mild degenerative disc disease, there is no significant canal, of foraminal stenosis, MRI of the brain showed moderate perisylvian fissure atrophy, mild to moderate small vessel disease, no acute lesions.  Athena diagnostic testing showed negative musk antibody, borderline antibody to acetylcholine receptor.  CT of the chest showed mild cardiomegaly, no acute lesions, no thymus pathology noticed.  He also complains of daytime sleepiness, snoring, ESS score is 12, he tends to sleep while sitting down reading, watching TV, even sitting inactive in public places, lying down rest in the afternoon, after lunch, FSS score is 47, he also complains of dry mouth, Frequent wakening at night time  He also complains of right knee pain, has failed conservative treatment, including intra-articular knee injection, is planning on having right knee replacement sometimes  UPDATE July 16th 2015: He will have appointment with [facility] in August 2015, he continue complains of gait difficulty, tired easily, daytime sleepiness, sleep study has confirmed obstructive sleep apnea, is using CPAP machine.  REVIEW OF SYSTEMS: Full 14 system review of systems performed and notable only for fatigue, hearing loss, ringing ears, eye itching, light sensitive,  Dizziness  ALLERGIES: No Known Allergies  HOME MEDICATIONS: Current Outpatient Prescriptions on File Prior to Visit  Medication Sig Dispense Refill  . gabapentin (NEURONTIN) 300 MG capsule Take 300 mg by mouth 3 (three) times daily.      Marland Kitchen levothyroxine (SYNTHROID, LEVOTHROID) 75 MCG  tablet Take 75 mcg by mouth daily  before breakfast.      . simvastatin (ZOCOR) 20 MG tablet Take 20 mg by mouth every evening.         PAST MEDICAL HISTORY: Past Medical History  Diagnosis Date  . Hyperlipemia   . Thyroid disease   . Neuropathy   . Dizziness   . Weakness     PAST SURGICAL HISTORY: Past Surgical History  Procedure Laterality Date  . Tonsillectomy and adenoidectomy    . Cyst removal hand    . Throat surgery    . Larynx surgery      FAMILY HISTORY: Family History  Problem Relation Age of Onset  . Seizures Mother   . Cancer - Other Father     SOCIAL HISTORY:  History   Social History  . Marital Status: Married    Spouse Name: N/A    Number of Children: N/A  . Years of Education: N/A   Occupational History  . Retired..   Social History Main Topics  . Smoking status: Never Smoker   . Smokeless tobacco: Not on file  . Alcohol Use: No  . Drug Use: Not on file  . Sexual Activity: Not on file   Other Topics Concern  . Not on file   Social History Narrative  . No narrative on file   PHYSICAL EXAM   Filed Vitals:   04/25/14 0848  BP: 138/81  Pulse: 77  Height: _0  (1.778 m)  Weight: 184 lb (83.462 kg)    Body mass index is 26.4 kg/(m^2).   Generalized: In no acute distress  Neck: Supple, no carotid bruits   Cardiac: Regular rate rhythm  Pulmonary: Clear to auscultation bilaterally  Musculoskeletal: No deformity  Neurological examination  Mentation: Alert oriented to time, place, history taking, and causual conversation, He has soft mild slurred speech decreased facial expression,   Cranial nerve II-XII: Pupils were equal round reactive to light. Extraocular movements were full.  Cover and uncover testing demonstrated bilateral exophoria. Visual field were full on confrontational test. Red lens testing has demonstrated left medial rectus weakness.  Facial sensation  were normal.  he has mild to moderate moderate bilateral  eye-closure, cheek puff weakness, left ptosis covering the upper edge of left pupil, fatigable  Hearing was intact to finger rubbing bilaterally. Uvula tongue midline.  Head turning and shoulder shrug and were normal and symmetric.Tongue protrusion into cheek strength was normal.  Motor: he has mild fatigable bilateral shoulder abduction, external rotation, mild bilateral hip flexion weakness, mild bilateral ankle dorsi flexion weakness,  Sensory:  length dependent decreased to fine touch, pinprick above to mid shin level , decreased  vibratory sensation to mid shin level  Coordination: Normal finger to nose, heel-to-shin bilaterally there was no truncal ataxia  Gait:  he could not get up from seated position arm across, need to push down chair arm, stoop forward, cautious, unsteady gait, knee valgrus,  He could not stand up on his heels,     Romberg signs: Negative  Deep tendon reflexes: Brachioradialis 2/2, biceps 2/2, triceps 2/2, patellar 2/2, Achilles  trace , plantar responses were flexor bilaterally.   DIAGNOSTIC DATA (LABS, IMAGING, TESTING) - I reviewed patient records, labs, notes, testing and imaging myself where available.  ASSESSMENT AND PLAN  Lyndell Allaire is a 73 y.o. male Presenting with gradual onset generalized weakness, intermittent double vision, easy fatigue, daytime sleepiness,  on examination, he has evidence of length dependent sensory changes, consistent with peripheral neuropathy, the most striking abnormality is  fatigable left ptosis, moderate bulbar and limb muscle weakness, most suggestive of myasthenia gravis. Ache receptor antibody was negative,  repeat Athena diagnostics testing showed borderline acetylcholine receptor antibody, negative musk antibody.  EMG nerve conduction study has demonstrated axonal peripheral neuropathy.   1. possible serum negative generalized myasthenia gravis, possibility also including 2 He is to continue with [facility] for  Single fiber EMG nerve conduction study and neuromuscular consultation,  3. increase Mestinon to 60 mg 3 times a day  4  return to clinic in September  Marcial Pacas, M.D. Ph.D.  [facility] [address], [place], [place] [address] [phone]

## 2014-04-29 ENCOUNTER — Ambulatory Visit: Payer: Medicare Other | Admitting: Neurology

## 2014-05-09 ENCOUNTER — Encounter: Payer: Self-pay | Admitting: Neurology

## 2014-05-16 NOTE — Telephone Encounter (Signed)
Noted  

## 2014-05-21 NOTE — Progress Notes (Signed)
Quick Note:  I reviewed the patient's CPAP compliance data from 04/09/2014 to 05/08/2014, which is a total of 30 days, during which time the patient used CPAP every day except for 4 days. The average usage for all days was 4 hours and 59 minutes. The percent used days greater than 4 hours was 70 %, indicating borderline compliance. The residual AHI was suboptimal at 8.1 per hour, indicating a suboptimal treatment pressure of 6 cwp with EPR of 1. Air leak from the mask was acceptable at 9.2 L per minute at the 95th percentile. With better compliance we may expect the AHI to be somewhat lower than what it is now. I will review this data with the patient at the next office visit, which is currently routinely scheduled for 05/30/2014 at 2 PM, provide feedback and additional troubleshooting if need be.  [name], MD, PhD [facility] ([facility]   ______

## 2014-05-30 ENCOUNTER — Encounter: Payer: Self-pay | Admitting: Neurology

## 2014-05-30 ENCOUNTER — Ambulatory Visit (INDEPENDENT_AMBULATORY_CARE_PROVIDER_SITE_OTHER): Payer: Medicare Other | Admitting: Neurology

## 2014-05-30 VITALS — BP 137/77 | HR 86 | Ht 70.0 in | Wt 180.6 lb

## 2014-05-30 DIAGNOSIS — G589 Mononeuropathy, unspecified: Secondary | ICD-10-CM

## 2014-05-30 DIAGNOSIS — G629 Polyneuropathy, unspecified: Secondary | ICD-10-CM

## 2014-05-30 DIAGNOSIS — Z9989 Dependence on other enabling machines and devices: Principal | ICD-10-CM

## 2014-05-30 DIAGNOSIS — R269 Unspecified abnormalities of gait and mobility: Secondary | ICD-10-CM

## 2014-05-30 DIAGNOSIS — G4733 Obstructive sleep apnea (adult) (pediatric): Secondary | ICD-10-CM

## 2014-05-30 NOTE — Patient Instructions (Signed)
Please continue using your CPAP regularly. While your insurance requires that you use CPAP at least 4 hours each night on 70% of the nights, I recommend, that you not skip any nights and use it throughout the night if you can. Getting used to CPAP and staying with the treatment long term does take time and patience and discipline. Untreated obstructive sleep apnea when it is moderate to severe can have an adverse impact on cardiovascular health and raise her risk for heart disease, arrhythmias, hypertension, congestive heart failure, stroke and diabetes. Untreated obstructive sleep apnea causes sleep disruption, nonrestorative sleep, and sleep deprivation. This can have an impact on your day to day functioning and cause daytime sleepiness and impairment of cognitive function, memory loss, mood disturbance, and problems focussing. Using CPAP regularly can improve these symptoms.  We will have you see Hoyle Sauer, NP in about 3 months for sleep apnea check up, you will see Dr. Krista Blue in September and I will see you in 6 months!

## 2014-05-30 NOTE — Progress Notes (Signed)
Subjective:    Patient ID: Ian Gibbs is a 73 y.o. male.  HPI   [name], MD, PhD [facility] [address], [address] [place], [place] [address]  Dear Aliene Beams,   I saw your patient, Ian Gibbs, upon your kind request in my neurologic clinic today for initial consultation after his recent sleep study. The patient is unaccompanied today. As you know, Ian Gibbs is a 73 year old right-handed gentleman with an underlying medical history of obesity, HLP, PN and thyroid disease, you had referred him for sleep study due to a report of snoring and EDS (ESS 12). He had a split-night sleep study on 03/08/2014 and I went over his test results with him in detail today. Baseline sleep efficiency was 56.2% only with a latency to sleep prolonged at 93.5 minutes and wake after sleep onset of 8 minutes with moderate sleep fragmentation noted. He had any increased percentage of light stage sleep and absence of deep sleep and REM sleep. He had rare PVCs. He had mild to moderate snoring. He had a total AHI based on 62 obstructive hypopneas of 28.6 per hour. Baseline oxygen saturation was 93%, nadir was 86%. He was an titrated on CPAP and sleep efficiency was improved at 95.8%. His arousal index was improved. He achieved an increased percentage of deep sleep and a normal percentage of REM sleep. Average oxygen saturation was 94%, nadir was 84%. He was titrated on CPAP from 5-6 cm with a reduction of his AHI to 0 events per hour on the final pressure with supine REM sleep achieved.  I reviewed the patient's CPAP compliance data from 04/09/2014 to 05/08/2014, which is a total of 30 days, during which time the patient used CPAP every day except for 4 days. The average usage for all days was 4 hours and 59 minutes. The percent used days greater than 4 hours was 70 %, indicating borderline compliance. The residual AHI was suboptimal at 8.1 per hour, indicating a suboptimal treatment  pressure of 6 cwp with EPR of 1. Air leak from the mask was acceptable at 9.2 L per minute at the 95th percentile. With better compliance I felt, we would expect the AHI to be somewhat lower than that.  Today, I reviewed his most recent compliance data from 04/09/2014 through 05/29/2014 which is a total of 51 days during which time he uses CPAP machine only 35 days. Percent used days greater than 4 hours was only 57%, indicating suboptimal compliance. Residual AHI is mildly elevated at 6.9 per hour. Leak was acceptable for the 95th percentile at 9.7 L per minute.  Today, he reports an improved quality of sleep and less EDS with CPAP, but he has a tendency to take the take the mask off in the middle of the night. He states, his wife noted a big difference in the quality of his sleep and his daytime symptoms after he started CPAP. Unfortunately, he was not able to use his machine quite as well after his wife got sick and needed extra care and help overnight. He has since then been able to use his machine a little bit better. This would explain the lapse in treatment and the decline in compliance. Overall, he feels that he has benefited from treatment and would be willing to continue with CPAP as he does feel better. He goes to bed around 11 PM and wakes around 7 AM. He has to go to the bathroom usually just once per night and  usually it is in the early morning hours. He denies restless leg syndrome type symptoms and is not known to kick in his sleep, but feels quite stiff first thing in the morning. He has a history of neuropathy and has an appointment at [facility] for additional testing as I understand. He drinks one caffeinated beverage per day. They have no TV in the bedroom.  He has been using a three-pronged cane for the last 3 months. He has not fallen.    His Past Medical History Is Significant For: Past Medical History  Diagnosis Date  . Hyperlipemia   . Thyroid  disease   . Neuropathy   . Dizziness   . Weakness     His Past Surgical History Is Significant For: Past Surgical History  Procedure Laterality Date  . Tonsillectomy and adenoidectomy    . Cyst removal hand    . Throat surgery    . Larynx surgery      His Family History Is Significant For: Family History  Problem Relation Age of Onset  . Seizures Mother   . Cancer - Other Father     His Social History Is Significant For: History   Social History  . Marital Status: Married    Spouse Name: Ian Gibbs    Number of Children: 1  . Years of Education: masters   Occupational History  .      Retired - Theme park manager   Social History Main Topics  . Smoking status: Never Smoker   . Smokeless tobacco: Never Used  . Alcohol Use: No  . Drug Use: No  . Sexual Activity: None   Other Topics Concern  . 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 levothyroxine (SYNTHROID, LEVOTHROID) 75 MCG tablet Take 75 mcg by mouth daily before breakfast.  . meloxicam (MOBIC) 7.5 MG tablet Take 7.5 mg by mouth daily.  Marland Kitchen pyridostigmine (MESTINON) 60 MG tablet Take 1 tablet (60 mg total) by mouth 3 (three) times daily. 1/2 to one tabs three times a day  . simvastatin (ZOCOR) 20 MG tablet Take 20 mg by mouth every evening.  :  Review of Systems:  Out of a complete 14 point review of systems, all are reviewed and negative with the exception of these symptoms as listed below:   Review of Systems  Constitutional: Positive for activity change.  HENT:       Ringing in ear  Eyes: Positive for itching and visual disturbance.  Respiratory:       Snoring  Neurological: Positive for dizziness.  Psychiatric/Behavioral:  Positive for sleep disturbance.    Objective:  Neurologic Exam  Physical Exam Physical Examination:   Filed Vitals:   05/30/14 1403  BP: 137/77  Pulse: 86    General Examination: The patient is a very pleasant 73 y.o. male in no acute distress. He appears well-developed and well-nourished and well groomed.   HEENT: Normocephalic, atraumatic, pupils are equal, round and reactive to light and accommodation. Funduscopic exam is normal with sharp disc margins noted. Extraocular tracking is good without limitation to gaze excursion or nystagmus noted. Normal  smooth pursuit is noted. Hearing is grossly intact. Tympanic membranes are clear bilaterally. Face is symmetric with normal facial animation and normal facial sensation. Speech is clear with no dysarthria noted. There is no hypophonia. There is no lip, neck/head, jaw or voice tremor. Neck is supple with full range of passive and active motion. There are no carotid bruits on auscultation. Oropharynx exam reveals: mild mouth dryness, adequate dental hygiene and moderate airway crowding, due to  narrow airway entry and redundant soft palate. Mallampati is class III. Tongue protrudes centrally and palate elevates symmetrically. Tonsils are absent. Neck size is 15.75 inches.    Chest: Clear to auscultation without wheezing, rhonchi or crackles noted.  Heart: S1+S2+0, regular and normal without murmurs, rubs or gallops noted.   Abdomen: Soft, non-tender and non-distended with normal bowel sounds appreciated on auscultation.  Extremities: There is no pitting edema in the distal lower extremities bilaterally. Pedal pulses are intact.  Skin: Warm and dry without trophic changes noted. There are no varicose veins.  Musculoskeletal: exam reveals no obvious joint deformities, tenderness or joint swelling or erythema.   Neurologically:  Mental status: The patient is awake, alert and oriented in all 4 spheres. His immediate and remote memory,  attention, language skills and fund of knowledge are appropriate. There is no evidence of aphasia, agnosia, apraxia or anomia. Speech is clear with normal prosody and enunciation. Thought process is linear. Mood is normal and affect is normal.  Cranial nerves II - XII are as described above under HEENT exam. In addition: shoulder shrug is normal with equal shoulder height noted. Motor exam: Normal bulk, strength and tone is noted. There is no drift, tremor or rebound. Romberg is negative. Reflexes are 2+ throughout. Fine motor skills are mildly impaired.  Sensory exam: intact to light touch, pinprick, vibration, temperature sense in the upper extremities and decreased to all modalities up to the midcalf area bilaterally.  Gait, station and balance: He stands with difficulty and pushes himself up. He stands slightly wide-based. I did not have him do tandem walk. He walks slightly insecurely and has a tendency to look at his feet while walking. He uses a three-pronged cane. He turns with mild insecurity. His posture is moderately stooped, advanced for age.   Assessment and Plan:    In summary, Anshul Meddings is a very pleasant 73 y.o.-year old male with an underlying medical history of obesity, HLP, PN and thyroid disease, who has recently been diagnosed with obstructive sleep apnea and a moderate degree. He has done well but unfortunately had a setback with his compliance because he had to take care of his wife. She is doing better and he is able to get back on track with CPAP. He has overall benefited from treatment and endorses improved sleep quality but also improve daytime somnolence. I explained his sleep test results in detail today and also went over his compliance data with him and explained the findings. I gave him a copy of his hypnogram and he also has a copy of his sleep study results at home. I congratulated him on trying to stay compliant with treatment. He has fulfilled compliance criteria in  the beginning of his treatment and was 70% compliant in the first 30 days of treatment. I did encourage him to use CPAP every night to help reduce cardiovascular risk. He is advised to stay well-hydrated.   We also talked about trying to maintaining a healthy lifestyle in general. I encouraged the patient to eat healthy,  exercise daily and keep well hydrated, to keep a scheduled bedtime and wake time routine, to not skip any meals and eat healthy snacks in between meals and to have protein with every meal. I stressed the importance of regular exercise.  He is encouraged to continue using his cane for safety. He has an appointment with you in a month from now. I suggested a three-month checkup with Cecille Rubin, nurse practitioner for sleep apnea checkup and CPAP compliance and I will see him back in 6 months from now. I answered all his questions today and the patient was in agreement with the above outlined plan. Thank you very much for allowing me to participate in the care of this very nice gentleman. [name], MD, PhD [facility])

## 2014-06-21 ENCOUNTER — Ambulatory Visit: Payer: Medicare Other | Admitting: Neurology

## 2014-07-01 ENCOUNTER — Encounter: Payer: Self-pay | Admitting: Neurology

## 2014-07-01 ENCOUNTER — Ambulatory Visit (INDEPENDENT_AMBULATORY_CARE_PROVIDER_SITE_OTHER): Payer: Medicare Other | Admitting: Neurology

## 2014-07-01 ENCOUNTER — Encounter (INDEPENDENT_AMBULATORY_CARE_PROVIDER_SITE_OTHER): Payer: Self-pay

## 2014-07-01 VITALS — BP 124/71 | HR 86 | Ht 70.0 in | Wt 180.0 lb

## 2014-07-01 DIAGNOSIS — R5381 Other malaise: Secondary | ICD-10-CM

## 2014-07-01 DIAGNOSIS — R42 Dizziness and giddiness: Secondary | ICD-10-CM

## 2014-07-01 DIAGNOSIS — H02409 Unspecified ptosis of unspecified eyelid: Secondary | ICD-10-CM

## 2014-07-01 DIAGNOSIS — R531 Weakness: Secondary | ICD-10-CM

## 2014-07-01 DIAGNOSIS — H02403 Unspecified ptosis of bilateral eyelids: Secondary | ICD-10-CM

## 2014-07-01 DIAGNOSIS — R5383 Other fatigue: Secondary | ICD-10-CM

## 2014-07-01 DIAGNOSIS — H532 Diplopia: Secondary | ICD-10-CM

## 2014-07-01 NOTE — Progress Notes (Signed)
PATIENT: Ian Gibbs DOB: July 08, 1941  HISTORICAL  Ian Gibbs 73 yo RH WM with wife, is referred by his primary care physician Dr. Morrie Sheldon for evaluation of peripheral neuropathy, ptosis, blurry vision, dizziness, generalized weakness, Initial visit, March 2015.   He had past medical history of hypothyroidism, hyperlipidemia  [place], he developed gradual onset difficulty talking, his voice varies from high pitch to low, he also has mild dysphagia, [place] was treated at the [facility] by ENT, was diagnosed with bilateral vocal cord paralysis of unknown etiology, had surgery, which helped his symptoms some  In 2012, he noticed bilateral feet discomfort, numbness tingling, burning discomfort, getting worse after bearing weight, gradually getting worse, he was evaluated by [facility]ist Dr. Ellender Hose, reported abnormal EMG nerve conduction study consistent with peripheral neuropathy, notes also reported extensive laboratory evaluation, including vitamin B1, IFEP was normal,  Over the years, his symptoms has been controlled with titrating dose of gabapentin, currently he is taking 600 mg 3 times a day.   In 2013, he also developed generalized weakness, intermittent ptosis, occasionally gait difficulty, getting worse over past 1 year, over past 6 months, he noticed worsening ptosis, also intermittent double vision, especially with prolonged reading, speech is slurred, mild swallowing difficulties, gait difficulty, he also complains of dizziness, lightheadedness after prolonged standing,  MRI of the brain showed mild to moderate periventricular white matter disease, moderate atrophy  Laboratory showed normal or Negative RPR, B12, C. reactive protein, folic acid, TSH, ESR, ANA, acetylcholine receptor antibody,  EMG nerve conduction study showed length dependent mild axonal peripheral neuropathy, repetitive  stimulation of right accessory nerve, recording at right upper trapezius showed no abnormalities.  He continue complains of fatigue, lack of stamina, with prolonged walking, he complains of low back pain, bilateral calf muscle tightness, achiness, he is taking Mestinon 60 mg 3 times a day, tolerating it well, but there was no significant improvement  MRI of the lumbar, only mild degenerative disc disease, there is no significant canal, of foraminal stenosis, MRI of the brain showed moderate perisylvian fissure atrophy, mild to moderate small vessel disease, no acute lesions.  Athena diagnostic testing showed negative musk antibody, borderline antibody to acetylcholine receptor.  CT of the chest showed mild cardiomegaly, no acute lesions, no thymus pathology noticed.  He also complains of daytime sleepiness, snoring, ESS score is 12, he tends to sleep while sitting down reading, watching TV, even sitting inactive in public places, lying down rest in the afternoon, after lunch, FSS score is 47, he also complains of dry mouth, Frequent wakening at night time  He also complains of right knee pain, has failed conservative treatment, including intra-articular knee injection, is planning on having right knee replacement sometimes  UPDATE July 16th 2015: He will have appointment with [facility] in August 2015, he continue complains of gait difficulty, tired easily, daytime sleepiness, sleep study has confirmed obstructive sleep apnea, is using CPAP machine.  UPDATE Jul 01 2014: He continues to get weaker, he complains of dizziness when getting up, lightheaded sensation, no spinning around, he has unsteady gait, rely on his cane, mild short-term memory trouble.  His maternal grandmother, mother had Alzheimer's disease.  I have reviewed the [facility] neuromuscular consultation, and single-fiber EMG by Dr. Vallarie Mare, the conclusion was normal study. The main jitter was 33.103m, less than the  normal range of 41, only one pair was abnormal at 106,  and that particular repair showed blocking, 2 other pairs approached the upper limits of normal, and did not show blocking.  REVIEW OF SYSTEMS: Full 14 system review of systems performed and notable only for fatigue, hearing loss, ringing ears, eye itching, light sensitive,  Dizziness  ALLERGIES: No Known Allergies  HOME MEDICATIONS: Current Outpatient Prescriptions on File Prior to Visit  Medication Sig Dispense Refill  . gabapentin (NEURONTIN) 300 MG capsule Take 300 mg by mouth 3 (three) times daily.      Marland Kitchen levothyroxine (SYNTHROID, LEVOTHROID) 75 MCG tablet Take 75 mcg by mouth daily before breakfast.      . simvastatin (ZOCOR) 20 MG tablet Take 20 mg by mouth every evening.         PAST MEDICAL HISTORY: Past Medical History  Diagnosis Date  . Hyperlipemia   . Thyroid disease   . Neuropathy   . Dizziness   . Weakness     PAST SURGICAL HISTORY: Past Surgical History  Procedure Laterality Date  . Tonsillectomy and adenoidectomy    . Cyst removal hand    . Throat surgery    . Larynx surgery      FAMILY HISTORY: Family History  Problem Relation Age of Onset  . Seizures Mother   . Cancer - Other Father     SOCIAL HISTORY:  History   Social History  . Marital Status: Married    Spouse Name: N/A    Number of Children: N/A  . Years of Education: N/A   Occupational History  . Retired..   Social History Main Topics  . Smoking status: Never Smoker   . Smokeless tobacco: Not on file  . Alcohol Use: No  . Drug Use: Not on file  . Sexual Activity: Not on file   Other Topics Concern  . Not on file   Social History Narrative  . No narrative on file   PHYSICAL EXAM   Filed Vitals:   07/01/14 1037  BP: 124/71  Pulse: 86  Height: 5' 10"  (1.778 m)  Weight: 180 lb (81.647 kg)    Body mass index is 25.83 kg/(m^2).   Generalized: In no acute distress  Neck: Supple, no carotid bruits   Cardiac:  Regular rate rhythm  Pulmonary: Clear to auscultation bilaterally  Musculoskeletal: No deformity  Neurological examination  Mentation: Alert oriented to time, place, history taking, and causual conversation, He has soft mild slurred speech decreased facial expression,  MMSE 29/30, he missed 1/3 recalls.  Cranial nerve II-XII: Pupils were equal round reactive to light. Extraocular movements were full.  Cover and uncover testing demonstrated bilateral exophoria. Visual field were full on confrontational test. Red lens testing has demonstrated bilateral medial rectus weakness.  Facial sensation  were normal.  he has mild to moderate bilateral eye-closure, cheek puff weakness, left ptosis covering the upper edge of left pupil, fatigable  Hearing was intact to finger rubbing bilaterally. Uvula tongue midline.  Head turning and shoulder shrug and were normal and symmetric.Tongue protrusion into cheek strength was normal.  Motor: He has mild bilateral upper extremity rigidity, no significant weakness.,  Sensory:  length dependent decreased to fine touch, pinprick above to mid shin level , decreased  vibratory sensation to mid shin level  Coordination: Normal finger to nose, heel-to-shin bilaterally there was no truncal ataxia  Gait:  he could not get up from seated position arm across, need to push down chair arm, stoop forward, cautious, unsteady gait, knee valgrus,  He could not stand up  on his heels,     Romberg signs: Negative  Deep tendon reflexes: Brachioradialis 2/2, biceps 2/2, triceps 2/2, patellar 2/2, Achilles  trace , plantar responses were flexor bilaterally.   DIAGNOSTIC DATA (LABS, IMAGING, TESTING) - I reviewed patient records, labs, notes, testing and imaging myself where available.  ASSESSMENT AND PLAN  Nicco Reaume is a 73 y.o. male Presenting with gradual onset generalized weakness, intermittent double vision, easy fatigue, daytime sleepiness,  on examination, he has  evidence of length dependent sensory changes, consistent with peripheral neuropathy, the most striking abnormality is fatigable left ptosis, moderate bulbar and limb muscle weakness, most suggestive of myasthenia gravis. Ache receptor antibody was negative,  repeat Athena diagnostics testing showed borderline acetylcholine receptor antibody, negative musk antibody.  EMG nerve conduction study has demonstrated axonal peripheral neuropathy. SFEMG was normal.   1.  differentiation diagnosis of his continual decline he including deconditioning, central nervous system degenerative disorder, such as multisystem atrophy, he does has mild decreased facial expression, now complains of orthostatic dizziness, only mild bilaterally and rigidity. Also complains of mild memory trouble. 2 he is going to have right knee replacement in July 16 2014, 3. Return to clinic in 6 months.  Marcial Pacas, M.D. Ph.D.  [facility] [address] [place], [place] [address] [phone]

## 2014-07-02 ENCOUNTER — Telehealth: Payer: Self-pay | Admitting: Neurology

## 2014-07-02 NOTE — Telephone Encounter (Signed)
I have called his wife, SFEMG result was normal.

## 2014-07-02 NOTE — Telephone Encounter (Signed)
Patient's wife called and missed doctor's call yesterday, said it was about a test patient had last week.  They are asking doctor to return call to [phone].

## 2014-07-10 ENCOUNTER — Encounter: Payer: Self-pay | Admitting: Neurology

## 2014-07-23 ENCOUNTER — Ambulatory Visit: Payer: Self-pay | Admitting: Neurology

## 2014-08-01 NOTE — Progress Notes (Signed)
Quick Note:  I reviewed the patient's CPAP compliance data from 06/10/2014 to 07/09/2014, which is a total of 30 days, during which time the patient used CPAP every day except for 1 day. The average usage for all days was 5 hours and 41 minutes. The percent used days greater than 4 hours was 80 %, indicating good to very good compliance. The residual AHI was 5.9 per hour, indicating a fairly adequate treatment pressure of 6 cwp. Air leak from the mask was low. I will review this data with the patient at the next office visit, which is currently routinely scheduled for 12/02/2014 at 3 PM , provide feedback and additional troubleshooting if need be. with full compliance it is expected that his AHI was stable a 5 per hour. He is going to be advised not to skip any days.   [name], MD, PhD [facility] ([facility]   ______

## 2014-09-04 ENCOUNTER — Encounter: Payer: Self-pay | Admitting: Nurse Practitioner

## 2014-09-04 ENCOUNTER — Ambulatory Visit (INDEPENDENT_AMBULATORY_CARE_PROVIDER_SITE_OTHER): Payer: Medicare Other | Admitting: Nurse Practitioner

## 2014-09-04 ENCOUNTER — Encounter: Payer: Self-pay | Admitting: Neurology

## 2014-09-04 VITALS — BP 116/62 | HR 88 | Ht 70.0 in | Wt 184.0 lb

## 2014-09-04 DIAGNOSIS — H532 Diplopia: Secondary | ICD-10-CM

## 2014-09-04 DIAGNOSIS — R269 Unspecified abnormalities of gait and mobility: Secondary | ICD-10-CM

## 2014-09-04 DIAGNOSIS — G4733 Obstructive sleep apnea (adult) (pediatric): Secondary | ICD-10-CM

## 2014-09-04 MED ORDER — PYRIDOSTIGMINE BROMIDE 60 MG PO TABS: 60.0000 mg | ORAL_TABLET | Freq: Three times a day (TID) | ORAL | Status: DC

## 2014-09-04 NOTE — Patient Instructions (Addendum)
Will refill mestinon Continue CPAP at current pressure F/U in 4 months with Dr. Krista Blue

## 2014-09-04 NOTE — Progress Notes (Addendum)
[facility]  PATIENT: Ian Gibbs DOB: [birth year]   REASON FOR VISIT: Follow-up for diplopia dizziness, history of obstructive sleep apnea   HISTORY OF PRESENT ILLNESS: Ian Gibbs 73 yo RH WM with wife, is referred by his primary care physician Dr. Morrie Sheldon for evaluation of peripheral neuropathy, ptosis, blurry vision, dizziness, generalized weakness, Initial visit, March 2015.  He had past medical history of hypothyroidism, hyperlipidemia [place], he developed gradual onset difficulty talking, his voice varies from high pitch to low, he also has mild dysphagia, [place], was treated at the [facility] by ENT, was diagnosed with bilateral vocal cord paralysis of unknown etiology, had surgery, which helped his symptoms some In 2012, he noticed bilateral feet discomfort, numbness tingling, burning discomfort, getting worse after bearing weight, gradually getting worse, he was evaluated by [facility] Dr. Ellender Hose, reported abnormal EMG nerve conduction study consistent with peripheral neuropathy, notes also reported extensive laboratory evaluation, including vitamin B1, IFEP was normal, Over the years, his symptoms has been controlled with titrating dose of gabapentin, currently he is taking 600 mg 3 times a day.  In 2013, he also developed generalized weakness, intermittent ptosis, occasionally gait difficulty, getting worse over past 1 year, over past 6 months, he noticed worsening ptosis, also intermittent double vision, especially with prolonged reading, speech is slurred, mild swallowing difficulties, gait difficulty, he also complains of dizziness, lightheadedness after prolonged standing,MRI of the brain showed mild to moderate periventricular white matter disease, moderate atrophy Laboratory showed normal or Negative RPR, B12, C. reactive protein, folic acid, TSH, ESR, ANA, acetylcholine  receptor antibody, EMG nerve conduction study showed length dependent mild axonal peripheral neuropathy, repetitive stimulation of right accessory nerve, recording at right upper trapezius showed no abnormalities. He continue complains of fatigue, lack of stamina, with prolonged walking, he complains of low back pain, bilateral calf muscle tightness, achiness, he is taking Mestinon 60 mg 3 times a day, tolerating it well, but there was no significant improvement MRI of the lumbar, only mild degenerative disc disease, there is no significant canal, of foraminal stenosis, MRI of the brain showed moderate perisylvian fissure atrophy, mild to moderate small vessel disease, no acute lesions. Athena diagnostic testing showed negative musk antibody, borderline antibody to acetylcholine receptor. CT of the chest showed mild cardiomegaly, no acute lesions, no thymus pathology noticed. He also complains of daytime sleepiness, snoring, ESS score is 12, he tends to sleep while sitting down reading, watching TV, even sitting inactive in public places, lying down rest in the afternoon, after lunch, FSS score is 47, he also complains of dry mouth, Frequent wakening at night time He also complains of right knee pain, has failed conservative treatment, including intra-articular knee injection, is planning on having right knee replacement sometimes UPDATE July 16th 2015: He will have appointment with [facility] in August 2015, he continue complains of gait difficulty, tired easily, daytime sleepiness, sleep study has confirmed obstructive sleep apnea, is using CPAP machine. UPDATE Jul 01 2014: He continues to get weaker, he complains of dizziness when getting up, lightheaded sensation, no spinning around, he has unsteady gait, rely on his cane, mild short-term memory trouble. His maternal grandmother, mother had Alzheimer's disease. I have reviewed the [facility] neuromuscular consultation, and single-fiber  EMG by Dr. Vallarie Mare, the conclusion was normal study. The main jitter was 33.21m, less than the normal range of 41, only one pair was abnormal at  106, and that particular repair showed blocking, 2 other pairs approached the upper limits of normal, and did not show blocking. UPDATE September 04, 2014 Ian Gibbs , 73 year old male returns for follow-up he has a history of diplopia which he claims is better. He has had 1 recent fall and is using a cane to ambulate. He had recent total knee replacement and has done well since his surgery. He also has a history of obstructive sleep apnea and uses CPAP. ESS score 10 today. Overall he  reports an improved quality of sleep and less EDS with CPAP, but he has a tendency to take the take the mask off in the middle of the night. He states, his wife noted a big difference in the quality of his sleep He continues to tire easily and has some daytime drowsiness. He returns for reevaluation.     REVIEW OF SYSTEMS: Full 14 system review of systems performed and notable only for those listed, all others are neg:  Constitutional: Fatigue Cardiovascular: N/A  Ear/Nose/Throat:  ringing in the ears  Eyes: N/A  Respiratory: N/A  Gastroitestinal: N/A  Hematology/Lymphatic: N/A  Endocrine: N/A Musculoskeletal:N/A  Allergy/Immunology: N/A  Neurological: dizziness, facial drooping, memory loss Psychiatric : N/A Sleep Obstructive sleep apnea ALLERGIES: No Known Allergies  HOME MEDICATIONS: Outpatient Prescriptions Prior to Visit  Medication Sig Dispense Refill  . gabapentin (NEURONTIN) 300 MG capsule Take 300 mg by mouth 2 (two) times daily.    Marland Kitchen levothyroxine (SYNTHROID, LEVOTHROID) 75 MCG tablet Take 75 mcg by mouth daily before breakfast.    . Multiple Vitamins-Minerals (MULTIVITAMIN PO) Take by mouth daily.    . simvastatin (ZOCOR) 20 MG tablet Take 20 mg by mouth every evening.    . pyridostigmine (MESTINON) 60 MG tablet Take 1 tablet (60 mg total) by mouth 3  (three) times daily. 1/2 to one tabs three times a day 90 tablet 6  . meloxicam (MOBIC) 7.5 MG tablet Take 7.5 mg by mouth daily.     No facility-administered medications prior to visit.    PAST MEDICAL HISTORY: Past Medical History  Diagnosis Date  . Hyperlipemia   . Thyroid disease   . Neuropathy   . Dizziness   . Weakness   . Ptosis 2013    PAST SURGICAL HISTORY: Past Surgical History  Procedure Laterality Date  . Tonsillectomy and adenoidectomy    . Cyst removal hand    . Throat surgery    . Larynx surgery      FAMILY HISTORY: Family History  Problem Relation Age of Onset  . Seizures Mother   . Cancer - Other Father     SOCIAL HISTORY: History   Social History  . Marital Status: Married    Spouse Name: [name]    Number of Children: 1  . Years of Education: masters   Occupational History  .      Retired - Theme park manager   Social History Main Topics  . Smoking status: Never Smoker   . Smokeless tobacco: Never Used  . Alcohol Use: No  . Drug Use: No  . Sexual Activity: Not on file   Other Topics Concern  . 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 Generalized: In no acute distress Neck: Supple, no carotid bruits  Cardiac: Regular rate rhythm Pulmonary: Clear to auscultation bilaterally Musculoskeletal: No deformity  Neurological examination  Mentation: Alert oriented to time, place, history taking, and causual conversation, He has soft mild slurred speech decreased facial expression, MMSE 29/30, 07/01/14. ESS 10.  Cranial nerve II-XII: Pupils were equal round reactive to light. Extraocular movements were full. Visual field were full on  confrontational test.  Facial sensation were normal. he has mild to moderate bilateral eye-closure, cheek puff weakness, left ptosis covering the upper edge of left pupil, fatigable Hearing was intact to finger rubbing bilaterally. Uvula tongue midline. Head turning and shoulder shrug and were normal and symmetric.Tongue protrusion into cheek strength was normal. Motor: He has mild bilateral upper extremity rigidity, no significant weakness., Sensory: length dependent decreased to fine touch, pinprick above to mid shin level , decreased vibratory sensation to mid shin level Coordination: Normal finger to nose, heel-to-shin bilaterally there was no truncal ataxia Gait: he could not get up from seated position arm across, need to push down chair arm, stoop forward, cautious,  gait, with single point cane. He could not stand up on his heels,  Romberg signs: Negative Deep tendon reflexes: Brachioradialis 2/2, biceps 2/2, triceps 2/2, patellar 2/2, Achilles trace , plantar responses were flexor bilaterally.    DIAGNOSTIC DATA (LABS, IMAGING, TESTING) - I reviewed patient records, labs, notes, testing and imaging myself where available.  Lab Results  Component Value Date   WBC 7.8 12/12/2013   HGB 14.4 12/12/2013   HCT 40.6 12/12/2013   MCV 89 12/12/2013   PLT 202 12/12/2013      Component Value Date/Time   NA 141 12/12/2013 1427   K 4.5 12/12/2013 1427   CL 99 12/12/2013 1427   CO2 23 12/12/2013 1427   GLUCOSE 99 12/12/2013 1427   BUN 18 12/12/2013 1427   CREATININE 0.98 12/12/2013 1427   CALCIUM 9.1 12/12/2013 1427   PROT 7.2 12/12/2013 1427   AST 26 12/12/2013 1427   ALT 23 12/12/2013 1427   ALKPHOS 62 12/12/2013 1427   BILITOT 0.5 12/12/2013 1427   GFRNONAA 75 12/12/2013 1427   GFRAA 87 12/12/2013 1427    Lab Results  Component Value Date   VITAMINB12 693 12/12/2013   Lab Results  Component Value Date   TSH 2.740 12/12/2013      ASSESSMENT AND PLAN  73 y.o.  year old male  has a past medical history of gradual onset generalized weakness, intermittent double vision, easy fatigue, daytime sleepiness, on examination, he has evidence of length dependent sensory changes, consistent with peripheral neuropathy, the most striking abnormality is fatigable left ptosis, moderate bulbar and limb muscle weakness, most suggestive of myasthenia gravis. Ache receptor antibody was negative, repeat Athena diagnostics testing showed borderline acetylcholine receptor antibody, negative musk antibody. EMG nerve conduction study has demonstrated axonal peripheral neuropathy. SFEMG was normal.  CPAP  compliance data from 10-26- 09-03-14 which is a total of 30days during which time he uses CPAP machine only 24 days. Her was in the hospital for 3 days with knee replacement surgery. Percent used days greater than 4 hours was only 53%, indicating suboptimal compliance.  Leak was acceptable for the 95th percentile.This was read by Dr. Rexene Alberts.   Will refill mestinon 67m 1/2 to 1 TID Continue  CPAP at current pressure, on next equipment exchange check about mask F/U in 4 months with Dr. Krista Blue, already has an appt Dennie Bible, [facility], [facility], [facility] [address], [place], [place] [address] [phone]   I reviewed the above note and documentation by the Nurse Practitioner and agree with the history, physical exam, assessment and plan as outlined above. I was immediately available for face-to-face consultation. [name], MD, PhD [facility])

## 2014-10-15 DIAGNOSIS — Z96651 Presence of right artificial knee joint: Secondary | ICD-10-CM | POA: Diagnosis not present

## 2014-10-15 DIAGNOSIS — M25661 Stiffness of right knee, not elsewhere classified: Secondary | ICD-10-CM | POA: Diagnosis not present

## 2014-10-15 DIAGNOSIS — M25561 Pain in right knee: Secondary | ICD-10-CM | POA: Diagnosis not present

## 2014-10-15 DIAGNOSIS — R262 Difficulty in walking, not elsewhere classified: Secondary | ICD-10-CM | POA: Diagnosis not present

## 2014-10-21 DIAGNOSIS — R262 Difficulty in walking, not elsewhere classified: Secondary | ICD-10-CM | POA: Diagnosis not present

## 2014-10-21 DIAGNOSIS — M25661 Stiffness of right knee, not elsewhere classified: Secondary | ICD-10-CM | POA: Diagnosis not present

## 2014-10-21 DIAGNOSIS — M179 Osteoarthritis of knee, unspecified: Secondary | ICD-10-CM | POA: Diagnosis not present

## 2014-11-05 DIAGNOSIS — M25661 Stiffness of right knee, not elsewhere classified: Secondary | ICD-10-CM | POA: Diagnosis not present

## 2014-11-05 DIAGNOSIS — M179 Osteoarthritis of knee, unspecified: Secondary | ICD-10-CM | POA: Diagnosis not present

## 2014-11-05 DIAGNOSIS — M25561 Pain in right knee: Secondary | ICD-10-CM | POA: Diagnosis not present

## 2014-11-05 DIAGNOSIS — R262 Difficulty in walking, not elsewhere classified: Secondary | ICD-10-CM | POA: Diagnosis not present

## 2014-11-13 ENCOUNTER — Telehealth: Payer: Self-pay | Admitting: Neurology

## 2014-11-13 DIAGNOSIS — M25661 Stiffness of right knee, not elsewhere classified: Secondary | ICD-10-CM | POA: Diagnosis not present

## 2014-11-13 DIAGNOSIS — M179 Osteoarthritis of knee, unspecified: Secondary | ICD-10-CM | POA: Diagnosis not present

## 2014-11-13 DIAGNOSIS — R262 Difficulty in walking, not elsewhere classified: Secondary | ICD-10-CM | POA: Diagnosis not present

## 2014-11-13 NOTE — Telephone Encounter (Signed)
Called patient and rescheduled time on 12/02/14. Confirmed with wife.

## 2014-11-18 DIAGNOSIS — H02833 Dermatochalasis of right eye, unspecified eyelid: Secondary | ICD-10-CM | POA: Diagnosis not present

## 2014-11-18 DIAGNOSIS — Z961 Presence of intraocular lens: Secondary | ICD-10-CM | POA: Diagnosis not present

## 2014-11-18 DIAGNOSIS — H1852 Epithelial (juvenile) corneal dystrophy: Secondary | ICD-10-CM | POA: Diagnosis not present

## 2014-11-18 DIAGNOSIS — H3531 Nonexudative age-related macular degeneration: Secondary | ICD-10-CM | POA: Diagnosis not present

## 2014-12-02 ENCOUNTER — Ambulatory Visit (INDEPENDENT_AMBULATORY_CARE_PROVIDER_SITE_OTHER): Payer: Medicare Other | Admitting: Neurology

## 2014-12-02 ENCOUNTER — Encounter: Payer: Self-pay | Admitting: Neurology

## 2014-12-02 VITALS — BP 103/65 | HR 79 | Temp 98.0°F | Ht 70.0 in | Wt 185.0 lb

## 2014-12-02 DIAGNOSIS — R269 Unspecified abnormalities of gait and mobility: Secondary | ICD-10-CM

## 2014-12-02 DIAGNOSIS — G4733 Obstructive sleep apnea (adult) (pediatric): Secondary | ICD-10-CM | POA: Diagnosis not present

## 2014-12-02 DIAGNOSIS — G629 Polyneuropathy, unspecified: Secondary | ICD-10-CM | POA: Diagnosis not present

## 2014-12-02 DIAGNOSIS — Z9989 Dependence on other enabling machines and devices: Principal | ICD-10-CM

## 2014-12-02 NOTE — Progress Notes (Signed)
Subjective:    Patient ID: Ian Gibbs is a 74 y.o. male.  HPI     Interim history:   Ian Gibbs is a 74 year old right-handed gentleman with an underlying medical history of obesity, HLP, PN and thyroid disease, who presents for follow-up consultation of his obstructive sleep apnea, treated with CPAP. He is accompanied by his wife today. I first met him on 05/30/2014 at the request of Dr. Krista Blue, at which time we talked about his split-night sleep study results. We also talked about his compliance data. He reported improved sleep quality, less daytime somnolence, and his wife endorse that he was sleeping better. After his wife got sick he did not use his machine as well but overall was compliant with treatment. He was motivated to continue with treatment as he had noticed an improvement in his sleep.   I reviewed the patient's CPAP compliance data from 06/10/2014 to 07/09/2014, which is a total of 30 days, during which time the patient used CPAP every day except for 1 day. The average usage for all days was 5 hours and 41 minutes. The percent used days greater than 4 hours was 80 %, indicating good to very good compliance. The residual AHI was 5.9 per hour, indicating a fairly adequate treatment pressure of  6  cwp. Air leak from the mask was low.  Today, I reviewed his compliance data from 10/29/2014 through 11/27/2014 which is a total of 30 days during which time he used his machine every night except for one with percent used days greater than 4 hours of 93%, indicating excellent compliance with an average usage of 6 hours and 16 minutes of pressure of 6 cm. Residual AHI low at 1.8 per hour and leak low with the 95th percentile at 7.9 L/m.   Today, he reports doing fairly well, no issues tolerating the mask and pressure. He is compliant with treatment. He has some mental sluggishness sometimes per wife and admits to not always drinking enough water. His neuropathy seems unchanged to him.  He  had a split-night sleep study on 03/08/2014. Baseline sleep efficiency was 56.2% only with a latency to sleep prolonged at 93.5 minutes and wake after sleep onset of 8 minutes with moderate sleep fragmentation noted. He had any increased percentage of light stage sleep and absence of deep sleep and REM sleep. He had rare PVCs. He had mild to moderate snoring. He had a total AHI based on 62 obstructive hypopneas of 28.6 per hour. Baseline oxygen saturation was 93%, nadir was 86%. He was an titrated on CPAP and sleep efficiency was improved at 95.8%. His arousal index was improved. He achieved an increased percentage of deep sleep and a normal percentage of REM sleep. Average oxygen saturation was 94%, nadir was 84%. He was titrated on CPAP from 5-6 cm with a reduction of his AHI to 0 events per hour on the final pressure with supine REM sleep achieved.  I reviewed the patient's CPAP compliance data from 04/09/2014 to 05/08/2014, which is a total of 30 days, during which time the patient used CPAP every day except for 4 days. The average usage for all days was 4 hours and 59 minutes. The percent used days greater than 4 hours was 70 %, indicating borderline compliance. The residual AHI was suboptimal at 8.1 per hour, indicating a suboptimal treatment pressure of 6 cwp with EPR of 1. Air leak from the mask was acceptable at 9.2 L per minute at the 95th percentile. With  better compliance I felt, we would expect the AHI to be somewhat lower than that.  I reviewed his most recent compliance data from 04/09/2014 through 05/29/2014 which is a total of 51 days during which time he uses CPAP machine only 35 days. Percent used days greater than 4 hours was only 57%, indicating suboptimal compliance. Residual AHI is mildly elevated at 6.9 per hour. Leak was acceptable for the 95th percentile at 9.7 L per minute.  He goes to bed around 11 PM and wakes around 7 AM. He has to go to the bathroom usually just once per night  and usually it is in the early morning hours. He denies restless leg syndrome type symptoms and is not known to kick in his sleep, but feels quite stiff first thing in the morning. He has a history of neuropathy and has an appointment at [facility] for additional testing as I understand. He drinks one caffeinated beverage per day. They have no TV in the bedroom.   He has been using a three-pronged cane for the last 3 months. He has not fallen.     His Past Medical History Is Significant For: Past Medical History  Diagnosis Date  . Hyperlipemia   . Thyroid disease   . Neuropathy   . Dizziness   . Weakness   . Ptosis 2013    His Past Surgical History Is Significant For: Past Surgical History  Procedure Laterality Date  . Tonsillectomy and adenoidectomy    . Cyst removal hand    . Throat surgery    . Larynx surgery      His Family History Is Significant For: Family History  Problem Relation Age of Onset  . Seizures Mother   . Cancer - Other Father     His Social History Is Significant For: History   Social History  . Marital Status: Married    Spouse Name: [name]  . Number of Children: 1  . Years of Education: masters   Occupational History  .      Retired - Theme park manager   Social History Main Topics  . Smoking status: Never Smoker   . Smokeless tobacco: Never Used  . Alcohol Use: No  . Drug Use: No  . Sexual Activity: Not on file   Other Topics Concern  . 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 gabapentin (NEURONTIN) 300 MG capsule Take 300 mg by mouth 2 (two) times daily.  Marland Kitchen levothyroxine (SYNTHROID, LEVOTHROID) 75 MCG tablet Take 75 mcg by mouth  daily before breakfast.  . Multiple Vitamins-Minerals (MULTIVITAMIN PO) Take by mouth daily.  Marland Kitchen pyridostigmine (MESTINON) 60 MG tablet Take 1 tablet (60 mg total) by mouth 3 (three) times daily. 1/2 to one tabs three times a day  . simvastatin (ZOCOR) 20 MG tablet Take 20 mg by mouth every evening.  . [DISCONTINUED] traMADol (ULTRAM) 50 MG tablet Take 50 mg by mouth.  :  Review of Systems:  Out of a complete 14 point review of systems, all are reviewed and negative with the exception of these symptoms as listed below:  Review of Systems  Neurological: Positive for dizziness.    Objective:  Neurologic Exam  Physical Exam Physical Examination:   Filed Vitals:   12/02/14 1406  BP: 103/65  Pulse: 79  Temp: 98 F (36.7 C)    General Examination: The patient is a very pleasant 74 y.o. male in no acute distress. He appears well-developed and well-nourished and well groomed.   HEENT: Normocephalic, atraumatic, pupils are equal, round and reactive to light and accommodation. Funduscopic exam is normal with sharp disc margins noted. Extraocular tracking is good without limitation to gaze excursion or nystagmus noted. Normal smooth pursuit is noted. Hearing is grossly intact. Face is symmetric with normal facial animation and normal facial sensation. Speech is clear with no dysarthria noted. There is no hypophonia. There is no lip, neck/head, jaw or voice tremor. Neck is supple with full range of passive and active motion. There are no carotid bruits on auscultation. Oropharynx exam reveals: mild mouth dryness, adequate dental hygiene and moderate airway crowding, due to  narrow airway entry and redundant soft palate. Mallampati is class III. Tongue protrudes centrally and palate elevates symmetrically. Tonsils are absent.     Chest: Clear to auscultation without wheezing, rhonchi or crackles noted.  Heart: S1+S2+0, regular and normal without murmurs, rubs or gallops noted.   Abdomen: Soft,  non-tender and non-distended with normal bowel sounds appreciated on auscultation.  Extremities: There is no pitting edema in the distal lower extremities bilaterally. Pedal pulses are intact.  Skin: Warm and dry without trophic changes noted. There are no varicose veins.  Musculoskeletal: exam reveals no obvious joint deformities, tenderness or joint swelling or erythema.   Neurologically:  Mental status: The patient is awake, alert and oriented in all 4 spheres. His immediate and remote memory, attention, language skills and fund of knowledge are appropriate. There is no evidence of aphasia, agnosia, apraxia or anomia. Speech is clear with normal prosody and enunciation. Thought process is linear. Mood is normal and affect is normal.  Cranial nerves II - XII are as described above under HEENT exam. In addition: shoulder shrug is normal with equal shoulder height noted. Motor exam: Normal bulk, strength and tone is noted. There is no drift, tremor or rebound. Romberg is negative, except for mild swaying. Reflexes are 2+ in the UEs and 1+ in the LEs. Fine motor skills are mildly impaired.  Sensory exam: intact to light touch, pinprick, vibration, temperature sense in the upper extremities and decreased to all modalities up to the midcalf area bilaterally.  Gait, station and balance: He stands with difficulty and pushes himself up. He stands slightly wide-based. I did not have him do tandem walk. He walks slightly insecurely and has a tendency to look at his feet while walking. He uses a three-pronged cane. He turns with mild insecurity. His posture is moderately stooped, advanced for age.   Assessment and Plan:   In summary, Ian Gibbs is a very pleasant 74 year old male with an underlying medical history of obesity, HLP, PN and thyroid disease, who has recently been diagnosed with moderate obstructive sleep apnea and has been on treatment with CPAP at a pressure of 6 cm with full compliance and  good results. He has overall benefited from treatment and endorses improved sleep quality but also improve daytime somnolence. I again explained his sleep test results from 5/15 in detail today and also went over his compliance data with him and explained the findings. I gave him a copy of his hypnogram  and he also has a copy of his sleep study results at home. I congratulated him on his compliance and encouraged him to use CPAP every night to help reduce cardiovascular risk. He is advised to stay well-hydrated, which may also helps his mental sluggishness. He has an appointment with Dr. Krista Blue next month. I can see him back in a year.   We also talked about trying to maintaining a healthy lifestyle in general. I encouraged the patient to eat healthy, exercise daily and keep well hydrated, to keep a scheduled bedtime and wake time routine, to not skip any meals and eat healthy snacks in between meals and to have protein with every meal. I stressed the importance of regular exercise.  He is encouraged to continue using his cane for safety.  I answered all their questions today and the patient and his wife were in agreement with the above outlined plan.

## 2014-12-02 NOTE — Patient Instructions (Signed)
Please continue using your CPAP regularly. While your insurance requires that you use CPAP at least 4 hours each night on 70% of the nights, I recommend, that you not skip any nights and use it throughout the night if you can. Getting used to CPAP and staying with the treatment long term does take time and patience and discipline. Untreated obstructive sleep apnea when it is moderate to severe can have an adverse impact on cardiovascular health and raise her risk for heart disease, arrhythmias, hypertension, congestive heart failure, stroke and diabetes. Untreated obstructive sleep apnea causes sleep disruption, nonrestorative sleep, and sleep deprivation. This can have an impact on your day to day functioning and cause daytime sleepiness and impairment of cognitive function, memory loss, mood disturbance, and problems focussing. Using CPAP regularly can improve these symptoms.  Keep up the good work! I will see you back in 1 year for sleep apnea check up.

## 2014-12-10 DIAGNOSIS — R262 Difficulty in walking, not elsewhere classified: Secondary | ICD-10-CM | POA: Diagnosis not present

## 2014-12-10 DIAGNOSIS — M25661 Stiffness of right knee, not elsewhere classified: Secondary | ICD-10-CM | POA: Diagnosis not present

## 2014-12-10 DIAGNOSIS — M179 Osteoarthritis of knee, unspecified: Secondary | ICD-10-CM | POA: Diagnosis not present

## 2014-12-10 DIAGNOSIS — M25561 Pain in right knee: Secondary | ICD-10-CM | POA: Diagnosis not present

## 2014-12-19 DIAGNOSIS — M25661 Stiffness of right knee, not elsewhere classified: Secondary | ICD-10-CM | POA: Diagnosis not present

## 2014-12-19 DIAGNOSIS — R262 Difficulty in walking, not elsewhere classified: Secondary | ICD-10-CM | POA: Diagnosis not present

## 2014-12-19 DIAGNOSIS — M25561 Pain in right knee: Secondary | ICD-10-CM | POA: Diagnosis not present

## 2014-12-19 DIAGNOSIS — M179 Osteoarthritis of knee, unspecified: Secondary | ICD-10-CM | POA: Diagnosis not present

## 2014-12-25 DIAGNOSIS — R972 Elevated prostate specific antigen [PSA]: Secondary | ICD-10-CM | POA: Diagnosis not present

## 2014-12-25 DIAGNOSIS — Z79899 Other long term (current) drug therapy: Secondary | ICD-10-CM | POA: Diagnosis not present

## 2014-12-25 DIAGNOSIS — G629 Polyneuropathy, unspecified: Secondary | ICD-10-CM | POA: Diagnosis not present

## 2014-12-25 DIAGNOSIS — R7301 Impaired fasting glucose: Secondary | ICD-10-CM | POA: Diagnosis not present

## 2014-12-25 DIAGNOSIS — G7000 Myasthenia gravis without (acute) exacerbation: Secondary | ICD-10-CM | POA: Diagnosis not present

## 2014-12-25 DIAGNOSIS — E038 Other specified hypothyroidism: Secondary | ICD-10-CM | POA: Diagnosis not present

## 2014-12-25 DIAGNOSIS — Z0000 Encounter for general adult medical examination without abnormal findings: Secondary | ICD-10-CM | POA: Diagnosis not present

## 2014-12-25 DIAGNOSIS — Z760 Encounter for issue of repeat prescription: Secondary | ICD-10-CM | POA: Diagnosis not present

## 2014-12-25 DIAGNOSIS — E034 Atrophy of thyroid (acquired): Secondary | ICD-10-CM | POA: Diagnosis not present

## 2014-12-25 DIAGNOSIS — E782 Mixed hyperlipidemia: Secondary | ICD-10-CM | POA: Diagnosis not present

## 2014-12-31 ENCOUNTER — Encounter: Payer: Self-pay | Admitting: Neurology

## 2014-12-31 ENCOUNTER — Ambulatory Visit (INDEPENDENT_AMBULATORY_CARE_PROVIDER_SITE_OTHER): Payer: Medicare Other | Admitting: Neurology

## 2014-12-31 VITALS — BP 111/60 | HR 76 | Ht 70.0 in | Wt 183.0 lb

## 2014-12-31 DIAGNOSIS — Z9989 Dependence on other enabling machines and devices: Principal | ICD-10-CM

## 2014-12-31 DIAGNOSIS — G629 Polyneuropathy, unspecified: Secondary | ICD-10-CM

## 2014-12-31 DIAGNOSIS — R32 Unspecified urinary incontinence: Secondary | ICD-10-CM

## 2014-12-31 DIAGNOSIS — G4733 Obstructive sleep apnea (adult) (pediatric): Secondary | ICD-10-CM

## 2014-12-31 DIAGNOSIS — R531 Weakness: Secondary | ICD-10-CM | POA: Diagnosis not present

## 2014-12-31 DIAGNOSIS — H532 Diplopia: Secondary | ICD-10-CM

## 2014-12-31 DIAGNOSIS — R269 Unspecified abnormalities of gait and mobility: Secondary | ICD-10-CM | POA: Diagnosis not present

## 2014-12-31 DIAGNOSIS — R42 Dizziness and giddiness: Secondary | ICD-10-CM | POA: Diagnosis not present

## 2014-12-31 NOTE — Progress Notes (Signed)
[facility]  PATIENT: Ian Gibbs DOB: Jun 11, 1941   REASON FOR VISIT: Follow-up for diplopia dizziness, history of obstructive sleep apnea   HISTORY OF PRESENT ILLNESS: Ian Gibbs 74 yo RH WM with wife, is referred by his primary care phy sician Dr. Morrie Sheldon for evaluation of peripheral neuropathy, ptosis, blurry vision, dizziness, generalized weakness, Initial visit, March 2015.  He had past medical history of hypothyroidism, hyperlipidemia [place], he developed gradual onset difficulty talking, his voice varies from high pitch to low, he also has mild dysphagia, [place] was treated at the [facility] by ENT, was diagnosed with bilateral vocal cord paralysis of unknown etiology, had surgery, which helped his symptoms some In 2012, he noticed bilateral feet discomfort, numbness tingling, burning discomfort, getting worse after bearing weight, gradually getting worse, he was evaluated [facility] Dr. Ellender Hose, reported abnormal EMG nerve conduction study consistent with peripheral neuropathy, notes also reported extensive laboratory evaluation, including vitamin B1, IFEP was normal, Over the years, his symptoms has been controlled with titrating dose of gabapentin, currently he is taking 600 mg 3 times a day.  In 2013, he also developed generalized weakness, intermittent ptosis, occasionally gait difficulty, getting worse over past 1 year, over past 6 months, he noticed worsening ptosis, also intermittent double vision, especially with prolonged reading, speech is slurred, mild swallowing difficulties, gait difficulty, he also complains of dizziness, lightheadedness after prolonged standing,  MRI of the brain March 2015, showed moderate perisylvian fissure atrophy, mild to moderate small vessel disease, no acute lesions.  Laboratory showed normal or Negative RPR, B12, C. reactive protein,  folic acid, TSH, ESR, ANA, acetylcholine receptor antibody, EMG nerve conduction study March 2015,  showed length dependent mild axonal peripheral neuropathy, repetitive stimulation of right accessory nerve, recording at right upper trapezius showed no abnormalities. He continue complains of fatigue, lack of stamina, with prolonged walking, he complains of low back pain, bilateral calf muscle tightness, achiness, he is taking Mestinon 60 mg 3 times a day, tolerating it well, but there was no significant improvement MRI of the lumbar, only mild degenerative disc disease, there is no significant canal, of foraminal stenosis, Athena diagnostic testing showed negative musk antibody, borderline antibody to acetylcholine receptor. CT of the chest showed mild cardiomegaly, no acute lesions, no thymus pathology noticed. He also complains of daytime sleepiness, snoring, ESS score is 12, he tends to sleep while sitting down reading, watching TV, even sitting inactive in public places, lying down rest in the afternoon, after lunch, FSS score is 47, he also complains of dry mouth, frequent awakening at night time, he had sleep study, was put on CPAP machine,  UPDATE Jul 01 2014: He continues to get weaker, he complains of dizziness when getting up, lightheaded sensation, no spinning around, he has unsteady gait, rely on his cane, mild short-term memory trouble. His maternal grandmother, mother had Alzheimer's disease. I have reviewed the [facility] neuromuscular consultation in August 2015, and single-fiber EMG by Dr. Vallarie Mare, the conclusion was normal study. The main jitter was 33.34m, less than the normal range of 41, only one pair was abnormal at 106, and that particular repair showed blocking, 2 other pairs approached the upper limits of normal, and did not show blocking.  UPDATE March 22nd 2016: Right knee replacement in Oct 16th 2015, he recovered very well, no significant right knee pain, but he  continue have gait difficulty, fell twice  since, in addition, since January 2016, he developed worsening bowel and bladder incontinence,   Last visit was with Hoyle Sauer in November 2015, he was diagnosed with obstructive sleep apnea and uses CPAP. Using CPAP machine, overall much improved quality of sleep,  He complains of dizziness, fussy, at different times, mostly happened when he stands, blurry vision, lasting for a few minutes, worsening memory trouble, today's Mini-Mental status examination is 28 out of 30, animal naming is 11   REVIEW OF SYSTEMS: Full 14 system review of systems performed and notable only for those listed, all others are neg: Fatigue, ringing ears, runny nose, drooling, light sensitivity, double vision, blurry vision, incontinence of bowel and bladder, apnea, daytime sleepiness, back pain, memory loss, dizziness, headaches, numbness, speech difficulty, weakness, drooling, decreased concentration   ALLERGIES: No Known Allergies  HOME MEDICATIONS: Outpatient Prescriptions Prior to Visit  Medication Sig Dispense Refill  . gabapentin (NEURONTIN) 300 MG capsule Take 300 mg by mouth 2 (two) times daily.    Marland Kitchen levothyroxine (SYNTHROID, LEVOTHROID) 75 MCG tablet Take 75 mcg by mouth daily before breakfast.    . Multiple Vitamins-Minerals (MULTIVITAMIN PO) Take by mouth daily.    Marland Kitchen pyridostigmine (MESTINON) 60 MG tablet Take 1 tablet (60 mg total) by mouth 3 (three) times daily. 1/2 to one tabs three times a day 90 tablet 6  . simvastatin (ZOCOR) 20 MG tablet Take 20 mg by mouth every evening.     No facility-administered medications prior to visit.    PAST MEDICAL HISTORY: Past Medical History  Diagnosis Date  . Hyperlipemia   . Thyroid disease   . Neuropathy   . Dizziness   . Weakness   . Ptosis 2013    PAST SURGICAL HISTORY: Past Surgical History  Procedure Laterality Date  . Tonsillectomy and adenoidectomy    . Cyst removal hand    . Throat surgery    . Larynx  surgery    . Total knee arthroplasty      Right    FAMILY HISTORY: Family History  Problem Relation Age of Onset  . Seizures Mother   . Cancer - Other Father     SOCIAL HISTORY: History   Social History  . Marital Status: Married    Spouse Name: [name]  . Number of Children: 1  . Years of Education: masters   Occupational History  .      Retired - Theme park manager   Social History Main Topics  . Smoking status: Never Smoker   . Smokeless tobacco: Never Used  . Alcohol Use: No  . Drug Use: No  . Sexual Activity: Not on file   Other Topics Concern  . 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 PHYSICAL EXAMNIATION:  Gen: NAD, conversant, well nourised, obese, well groomed                     Cardiovascular: Regular rate rhythm, no peripheral edema, warm, nontender. Eyes: Conjunctivae clear without exudates or hemorrhage Neck: Supple, no carotid bruise. Pulmonary: Clear to auscultation bilaterally  NEUROLOGICAL EXAM:  MENTAL STATUS: Speech:    Speech is normal; fluent and spontaneous with normal comprehension.  Cognition: Mini-Mental Status Examination 28 out of 30, he missed one out of 3 recalls, has difficulty copy design, animal naming 11  CRANIAL NERVES: CN II: Visual fields are full to confrontation. Fundoscopic exam is normal with sharp discs and no vascular changes. Venous pulsations are present bilaterally. Pupils are 4 mm and briskly reactive to light. Visual acuity is 20/20 bilaterally. CN III, IV, VI: extraocular movement are normal. Left ptosis, CN V: Facial sensation is intact to pinprick in all 3 divisions bilaterally. Corneal responses are  intact.  CN VII: Face is symmetric, mild bilateral eye-closure, cheek puff weakness CN VIII: Hearing is normal to rubbing fingers CN IX, X: Palate elevates symmetrically. Phonation is normal. CN XI: Head turning and shoulder shrug are intact CN XII: Tongue is midline with normal movements and no atrophy.  MOTOR: There is no pronator drift of out-stretched arms. Muscle bulk and tone are normal. Muscle strength is normal.   Shoulder abduction Shoulder external rotation Elbow flexion Elbow extension Wrist flexion Wrist extension Finger abduction Hip flexion Knee flexion Knee extension Ankle dorsi flexion Ankle plantar flexion  R $'5 5 5 5 5 5 5 5 5 5 5 5  'A$ L '5 5 5 5 5 5 5 5 5 5 5 5    '$ REFLEXES: Reflexes are 2+ and symmetric at the biceps, triceps, knees, and ankles. Plantar responses are flexor.  SENSORY: Light touch, pinprick, position sense, and vibration sense are intact in fingers and toes.  COORDINATION: Rapid alternating movements and fine finger movements are intact. There is no dysmetria on finger-to-nose and heel-knee-shin. There are no abnormal or extraneous movements.   GAIT/STANCE: Stoop forward, bilateral valgrus knee, cautious, mildly unsteady,    DIAGNOSTIC DATA (LABS, IMAGING, TESTING) - I reviewed patient records, labs, notes, testing and imaging myself where available.  Lab Results  Component Value Date   WBC 7.8 12/12/2013   HGB 14.4 12/12/2013   HCT 40.6 12/12/2013   MCV 89 12/12/2013   PLT 202 12/12/2013      Component Value Date/Time   NA 141 12/12/2013 1427   K 4.5 12/12/2013 1427   CL 99 12/12/2013 1427   CO2 23 12/12/2013 1427   GLUCOSE 99 12/12/2013 1427   BUN 18 12/12/2013 1427   CREATININE 0.98 12/12/2013 1427   CALCIUM 9.1 12/12/2013 1427   PROT 7.2 12/12/2013 1427   AST 26 12/12/2013 1427   ALT 23 12/12/2013 1427   ALKPHOS 62 12/12/2013 1427   BILITOT 0.5 12/12/2013 1427   GFRNONAA 75 12/12/2013 1427   GFRAA 87 12/12/2013 1427    Lab  Results  Component Value Date   VITAMINB12 693 12/12/2013   Lab Results  Component Value Date   TSH 2.740 12/12/2013      ASSESSMENT AND PLAN  74 y.o. year old male  has a past medical history of gradual onset generalized weakness, intermittent double vision, easy fatigue, daytime sleepiness, on examination, he has evidence of length dependent sensory changes, consistent with peripheral neuropathy, the most striking abnormality is fatigable left ptosis, moderate bulbar and limb muscle weakness, there was a concern of possible neuromuscular junctional disorder, however extensive evaluation was negative, including acetylcholine receptor antibody was negative, repeat Athena diagnostics testing showed borderline acetylcholine receptor antibody, negative musk antibody. EMG nerve conduction study has demonstrated axonal peripheral neuropathy. SFEMG was normal at [facility]. He was diagnosed with obstructive  sleep apnea, using CPAP machine, showed improved sleep quality,  He had right knee replacement October of 2015, continue have mild gait difficulty, falling, since January 2016, also developed worsening bowel and bladder incontinence,  MRI of cervical spine to rule out spondylitic myelopathy   Marcial Pacas, M.D. Ph.D.  [facility] [place], [place] [address] Phone: [phone] Fax:      [phone]

## 2015-01-14 DIAGNOSIS — H903 Sensorineural hearing loss, bilateral: Secondary | ICD-10-CM | POA: Diagnosis not present

## 2015-01-14 DIAGNOSIS — R42 Dizziness and giddiness: Secondary | ICD-10-CM | POA: Diagnosis not present

## 2015-01-14 DIAGNOSIS — H6122 Impacted cerumen, left ear: Secondary | ICD-10-CM | POA: Diagnosis not present

## 2015-01-15 ENCOUNTER — Ambulatory Visit
Admission: RE | Admit: 2015-01-15 | Discharge: 2015-01-15 | Disposition: A | Discharge status: Home / Self Care | Payer: Medicare Other | Source: Ambulatory Visit | Attending: Neurology | Admitting: Neurology

## 2015-01-15 DIAGNOSIS — R269 Unspecified abnormalities of gait and mobility: Secondary | ICD-10-CM

## 2015-01-15 DIAGNOSIS — G4733 Obstructive sleep apnea (adult) (pediatric): Secondary | ICD-10-CM

## 2015-01-15 DIAGNOSIS — Z9989 Dependence on other enabling machines and devices: Principal | ICD-10-CM

## 2015-01-15 DIAGNOSIS — R42 Dizziness and giddiness: Secondary | ICD-10-CM

## 2015-01-15 DIAGNOSIS — G629 Polyneuropathy, unspecified: Secondary | ICD-10-CM

## 2015-01-15 DIAGNOSIS — R531 Weakness: Secondary | ICD-10-CM

## 2015-01-15 DIAGNOSIS — H532 Diplopia: Secondary | ICD-10-CM

## 2015-01-15 DIAGNOSIS — R32 Unspecified urinary incontinence: Secondary | ICD-10-CM

## 2015-01-16 ENCOUNTER — Telehealth: Payer: Self-pay | Admitting: Neurology

## 2015-01-16 NOTE — Telephone Encounter (Signed)
Michelle:   Please call patient, multilevel degenerative cervical disease, will review it in detail at hi follow-up visit February 06 2015  Abnormal MRI cervical spine (without) demonstrating: 1. At C5-6: severe biforaminal stenosis and mild spinal stenosis; no cord signal abnormalities. 2. At C4-5: moderate biforaminal stenosis. 3. At C3-4: moderate right and mild left foraminal stenosis. 4. At C6-7: moderate left foraminal stenosis. 5. Multi-level degenerative changes as above.

## 2015-01-16 NOTE — Telephone Encounter (Signed)
Patient aware of results and will keep follow up appt to further discuss.

## 2015-02-06 ENCOUNTER — Ambulatory Visit (INDEPENDENT_AMBULATORY_CARE_PROVIDER_SITE_OTHER): Payer: Medicare Other | Admitting: Neurology

## 2015-02-06 ENCOUNTER — Encounter: Payer: Self-pay | Admitting: Neurology

## 2015-02-06 VITALS — BP 115/66 | HR 80 | Ht 70.0 in | Wt 182.0 lb

## 2015-02-06 DIAGNOSIS — R531 Weakness: Secondary | ICD-10-CM

## 2015-02-06 DIAGNOSIS — Z9989 Dependence on other enabling machines and devices: Principal | ICD-10-CM

## 2015-02-06 DIAGNOSIS — G629 Polyneuropathy, unspecified: Secondary | ICD-10-CM

## 2015-02-06 DIAGNOSIS — R269 Unspecified abnormalities of gait and mobility: Secondary | ICD-10-CM | POA: Diagnosis not present

## 2015-02-06 DIAGNOSIS — H532 Diplopia: Secondary | ICD-10-CM

## 2015-02-06 DIAGNOSIS — G4733 Obstructive sleep apnea (adult) (pediatric): Secondary | ICD-10-CM

## 2015-02-06 MED ORDER — CARBIDOPA-LEVODOPA 25-100 MG PO TABS: 1.0000 | ORAL_TABLET | Freq: Three times a day (TID) | ORAL | Status: DC

## 2015-02-06 NOTE — Progress Notes (Signed)
[facility]  PATIENT: Ian Gibbs DOB: Jun 11, 1941   REASON FOR VISIT: Follow-up for diplopia dizziness, history of obstructive sleep apnea   HISTORY OF PRESENT ILLNESS: Ian Gibbs 74 yo RH WM with wife, is referred by his primary care phy sician Dr. Morrie Sheldon for evaluation of peripheral neuropathy, ptosis, blurry vision, dizziness, generalized weakness, Initial visit, March 2015.  He had past medical history of hypothyroidism, hyperlipidemia [place], he developed gradual onset difficulty talking, his voice varies from high pitch to low, he also has mild dysphagia, [place] was treated at the [facility] by ENT, was diagnosed with bilateral vocal cord paralysis of unknown etiology, had surgery, which helped his symptoms some In 2012, he noticed bilateral feet discomfort, numbness tingling, burning discomfort, getting worse after bearing weight, gradually getting worse, he was evaluated by [facility]ist Dr. Ellender Hose, reported abnormal EMG nerve conduction study consistent with peripheral neuropathy, notes also reported extensive laboratory evaluation, including vitamin B1, IFEP was normal, Over the years, his symptoms has been controlled with titrating dose of gabapentin, currently he is taking 600 mg 3 times a day.  In 2013, he also developed generalized weakness, intermittent ptosis, occasionally gait difficulty, getting worse over past 1 year, over past 6 months, he noticed worsening ptosis, also intermittent double vision, especially with prolonged reading, speech is slurred, mild swallowing difficulties, gait difficulty, he also complains of dizziness, lightheadedness after prolonged standing,  MRI of the brain March 2015, showed moderate perisylvian fissure atrophy, mild to moderate small vessel disease, no acute lesions.  Laboratory showed normal or Negative RPR, B12, C. reactive protein,  folic acid, TSH, ESR, ANA, acetylcholine receptor antibody, EMG nerve conduction study March 2015,  showed length dependent mild axonal peripheral neuropathy, repetitive stimulation of right accessory nerve, recording at right upper trapezius showed no abnormalities. He continue complains of fatigue, lack of stamina, with prolonged walking, he complains of low back pain, bilateral calf muscle tightness, achiness, he is taking Mestinon 60 mg 3 times a day, tolerating it well, but there was no significant improvement MRI of the lumbar, only mild degenerative disc disease, there is no significant canal, of foraminal stenosis, Athena diagnostic testing showed negative musk antibody, borderline antibody to acetylcholine receptor. CT of the chest showed mild cardiomegaly, no acute lesions, no thymus pathology noticed. He also complains of daytime sleepiness, snoring, ESS score is 12, he tends to sleep while sitting down reading, watching TV, even sitting inactive in public places, lying down rest in the afternoon, after lunch, FSS score is 47, he also complains of dry mouth, frequent awakening at night time, he had sleep study, was put on CPAP machine,  UPDATE Jul 01 2014: He continues to get weaker, he complains of dizziness when getting up, lightheaded sensation, no spinning around, he has unsteady gait, rely on his cane, mild short-term memory trouble. His maternal grandmother, mother had Alzheimer's disease. I have reviewed the [facility] neuromuscular consultation in August 2015, and single-fiber EMG by Dr. Vallarie Mare, the conclusion was normal study. The main jitter was 33.34m, less than the normal range of 41, only one pair was abnormal at 106, and that particular repair showed blocking, 2 other pairs approached the upper limits of normal, and did not show blocking.  UPDATE March 22nd 2016: Right knee replacement in Oct 16th 2015, he recovered very well, no significant right knee pain, but he  continue have gait difficulty, fell twice  since, in addition, since January 2016, he developed worsening bowel and bladder incontinence,   Last visit was with Hoyle Sauer in November 2015, he was diagnosed with obstructive sleep apnea and uses CPAP. Using CPAP machine, overall much improved quality of sleep,  He complains of dizziness, fussy, at different times, mostly happened when he stands, blurry vision, lasting for a few minutes, worsening memory trouble, today's Mini-Mental status examination is 28 out of 30, animal naming is 62  UPDATE April 28th 2016: He complains of feeling all the time, increased balance difficulty, rely on his cane, especially in past 2 weeks,he has no incontinence, he also complains of numbness at his feet, lasting for few minutes, vision is blurred, difficulty to read, even with new glasses prescirptions, he has lost sense of smell, he now sleeps well with CPAP  We have reviewed MRI of cervical spine, multilevel degenerative changes, foraminal stenosis at different levels, but no significant canal stenosis  REVIEW OF SYSTEMS: Full 14 system review of systems performed and notable only for those listed, all others are neg: Fatigue, ringing ears, runny nose, drooling, light sensitivity, double vision, blurry vision, incontinence of bowel and bladder, apnea, daytime sleepiness, back pain, memory loss, dizziness, headaches, numbness, speech difficulty, weakness, drooling, decreased concentration   ALLERGIES: No Known Allergies  HOME MEDICATIONS: Outpatient Prescriptions Prior to Visit  Medication Sig Dispense Refill  . gabapentin (NEURONTIN) 300 MG capsule Take 300 mg by mouth 2 (two) times daily.    Marland Kitchen levothyroxine (SYNTHROID, LEVOTHROID) 75 MCG tablet Take 75 mcg by mouth daily before breakfast.    . Multiple Vitamins-Minerals (MULTIVITAMIN PO) Take by mouth daily.    Marland Kitchen OVER THE COUNTER MEDICATION Taking Persavision tablets once daily.    Marland Kitchen pyridostigmine (MESTINON) 60  MG tablet Take 1 tablet (60 mg total) by mouth 3 (three) times daily. 1/2 to one tabs three times a day 90 tablet 6  . simvastatin (ZOCOR) 20 MG tablet Take 20 mg by mouth every evening.     No facility-administered medications prior to visit.    PAST MEDICAL HISTORY: Past Medical History  Diagnosis Date  . Hyperlipemia   . Thyroid disease   . Neuropathy   . Dizziness   . Weakness   . Ptosis 2013    PAST SURGICAL HISTORY: Past Surgical History  Procedure Laterality Date  . Tonsillectomy and adenoidectomy    . Cyst removal hand    . Throat surgery    . Larynx surgery    . Total knee arthroplasty      Right    FAMILY HISTORY: Family History  Problem Relation Age of Onset  . Seizures Mother   . Cancer - Other Father     SOCIAL HISTORY: History   Social History  . Marital Status: Married    Spouse Name: [name]  . Number of Children: 1  . Years of Education: masters   Occupational History  .      Retired - Theme park manager   Social History Main Topics  . Smoking status: Never Smoker   . Smokeless tobacco: Never Used  . Alcohol Use: No  . Drug Use: No  . Sexual Activity: Not on file   Other Topics Concern  . 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 PHYSICAL EXAMNIATION:  Gen: NAD, conversant, well nourised, obese, well groomed                     Cardiovascular: Regular rate rhythm, no peripheral edema, warm, nontender. Eyes: Conjunctivae clear without exudates or hemorrhage Neck: Supple, no carotid bruise. Pulmonary: Clear to auscultation bilaterally   NEUROLOGICAL EXAM:  MENTAL STATUS: Speech:    Speech is normal; fluent and spontaneous with  normal comprehension.  Cognition: Mini-Mental Status Examination 28 out of 30, he missed one out of 3 recalls, has difficulty copy design, animal naming 11  CRANIAL NERVES: CN II: Visual fields are full to confrontation. Fundoscopic exam is normal with sharp discs and no vascular changes. Venous pulsations are present bilaterally. Pupils are 4 mm and briskly reactive to light. Visual acuity is 20/20 bilaterally. CN III, IV, VI: extraocular movement are normal. Left ptosis, CN V: Facial sensation is intact to pinprick in all 3 divisions bilaterally. Corneal responses are intact.  CN VII: Face is symmetric, mild bilateral eye-closure, cheek puff weakness CN VIII: Hearing is normal to rubbing fingers CN IX, X: Palate elevates symmetrically. Phonation is normal. CN XI: Head turning and shoulder shrug are intact CN XII: Tongue is midline with normal movements and no atrophy.  MOTOR: Normal muscle bulk, and strength, he has mild limb and nuchal rigidity,   Shoulder abduction Shoulder external rotation Elbow flexion Elbow extension Wrist flexion Wrist extension Finger abduction Hip flexion Knee flexion Knee extension Ankle dorsi flexion Ankle plantar flexion  R _1 L _2 REFLEXES: Reflexes are 2+ and symmetric at the biceps, triceps, knees, and ankles. Plantar responses are flexor.  SENSORY: Light touch, pinprick, position sense, and vibration sense are intact in fingers and toes.  COORDINATION: Rapid alternating movements and fine finger movements are intact. There is no dysmetria on finger-to-nose and heel-knee-shin. There are no abnormal or extraneous movements.   GAIT/STANCE: Stoop forward, bilateral valgrus knee, cautious, mildly unsteady, decreased bilateral arm swing  DIAGNOSTIC DATA (LABS, IMAGING, TESTING) - I reviewed patient records, labs, notes, testing and imaging myself where available.  Lab Results  Component Value Date   WBC  7.8 12/12/2013   HGB 14.4 12/12/2013   HCT 40.6 12/12/2013   MCV 89 12/12/2013   PLT 202 12/12/2013      Component Value Date/Time   NA 141 12/12/2013 1427   K 4.5 12/12/2013 1427   CL 99 12/12/2013 1427   CO2 23 12/12/2013 1427   GLUCOSE 99 12/12/2013 1427   BUN 18 12/12/2013 1427   CREATININE 0.98 12/12/2013 1427   CALCIUM 9.1 12/12/2013 1427   PROT 7.2 12/12/2013 1427   AST 26 12/12/2013 1427   ALT 23 12/12/2013 1427   ALKPHOS 62 12/12/2013 1427   BILITOT 0.5 12/12/2013 1427   GFRNONAA 75 12/12/2013 1427   GFRAA 87 12/12/2013 1427    Lab Results  Component Value Date   VITAMINB12 693 12/12/2013   Lab Results  Component Value Date   TSH 2.740 12/12/2013      ASSESSMENT AND PLAN  74 y.o. year old male  has a past medical history of gradual  onset generalized weakness, intermittent double vision, easy fatigue, daytime sleepiness, on examination, he has evidence of length dependent sensory changes, consistent with peripheral neuropathy, the most striking abnormality is fatigable left ptosis, moderate bulbar and limb muscle weakness, there was a concern of possible neuromuscular junctional disorder, however extensive evaluation was negative, including acetylcholine receptor antibody was negative, repeat Athena diagnostics testing showed borderline acetylcholine receptor antibody, negative musk antibody. EMG nerve conduction study has demonstrated axonal peripheral neuropathy. [facility] at [facility]. He was diagnosed with obstructive sleep apnea, using CPAP machine, showed improved sleep quality,  He had right knee replacement October of 2015, continue have mild gait difficulty, falling, since January 2016, also developed worsening bowel and bladder incontinence,  We have reviewed MRI of cervical spine: C5-6: severe biforaminal stenosis and mild spinal stenosis; no cord signal abnormalities. C4-5: moderate biforaminal stenosis. At C3-4: moderate right and mild  left foraminal stenosis. At C6-7: moderate left foraminal stenosis.  He has mild parkinsonian features, worsening stoop forward posturing, decreased arm swing, gait difficulty,  Will give him a trial of Sinemet 25/182m 3 times a day   Return to clinic in 3-4 months  YMarcial Pacas M.D. Ph.D.  [facility] [address] [place] [address]Phone: [phone]:      [phone]

## 2015-02-13 DIAGNOSIS — Z471 Aftercare following joint replacement surgery: Secondary | ICD-10-CM | POA: Diagnosis not present

## 2015-02-13 DIAGNOSIS — Z96651 Presence of right artificial knee joint: Secondary | ICD-10-CM | POA: Diagnosis not present

## 2015-02-13 DIAGNOSIS — M25561 Pain in right knee: Secondary | ICD-10-CM | POA: Diagnosis not present

## 2015-05-15 ENCOUNTER — Telehealth: Payer: Self-pay | Admitting: Neurology

## 2015-05-15 ENCOUNTER — Ambulatory Visit (INDEPENDENT_AMBULATORY_CARE_PROVIDER_SITE_OTHER): Payer: Medicare Other | Admitting: Neurology

## 2015-05-15 ENCOUNTER — Encounter: Payer: Self-pay | Admitting: Neurology

## 2015-05-15 VITALS — BP 145/82 | HR 77 | Ht 70.0 in | Wt 183.0 lb

## 2015-05-15 DIAGNOSIS — G4733 Obstructive sleep apnea (adult) (pediatric): Secondary | ICD-10-CM

## 2015-05-15 DIAGNOSIS — R269 Unspecified abnormalities of gait and mobility: Secondary | ICD-10-CM

## 2015-05-15 DIAGNOSIS — H532 Diplopia: Secondary | ICD-10-CM

## 2015-05-15 DIAGNOSIS — R531 Weakness: Secondary | ICD-10-CM

## 2015-05-15 DIAGNOSIS — G629 Polyneuropathy, unspecified: Secondary | ICD-10-CM

## 2015-05-15 NOTE — Progress Notes (Signed)
Chief Complaint  Patient presents with  . Dizziness    Feels his dizziness and blurred vision is becoming more problematic.  He notices the dizziness more after he has been up walking.  He denies it occurring when he is making positional changes.  . Gait Problem    He is still having difficulty with walking. He is unsure if he is taking Sinemet or not.  He will check when he gets home and call us back.     [facility]  PATIENT: Ian Gibbs DOB: Mar 07, 1941   REASON FOR VISIT: Follow-up for diplopia dizziness, history of obstructive sleep apnea   HISTORY OF PRESENT ILLNESS: Ian Gibbs 74 yo RH WM with wife, is referred by his primary care phy sician Dr. Morrie Sheldon for evaluation of peripheral neuropathy, ptosis, blurry vision, dizziness, generalized weakness, Initial visit, March 2015.  He had past medical history of hypothyroidism, hyperlipidemia [place], he developed gradual onset difficulty talking, his voice varies from high pitch to low, he also has mild dysphagia, [place], was treated at the [facility] by ENT, was diagnosed with bilateral vocal cord paralysis of unknown etiology, had surgery, which helped his symptoms some In 2012, he noticed bilateral feet discomfort, numbness tingling, burning discomfort, getting worse after bearing weight, gradually getting worse, he was evaluated [facility] Dr. Ellender Hose, reported abnormal EMG nerve conduction study consistent with peripheral neuropathy, notes also reported extensive laboratory evaluation, including vitamin B1, IFEP was normal, Over the years, his symptoms has been controlled with titrating dose of gabapentin, currently he is taking 600 mg 3 times a day.  In 2013, he also developed generalized weakness, intermittent ptosis, occasionally gait difficulty, getting worse over past 1 year, over past 6 months, he noticed worsening  ptosis, also intermittent double vision, especially with prolonged reading, speech is slurred, mild swallowing difficulties, gait difficulty, he also complains of dizziness, lightheadedness after prolonged standing,  MRI of the brain March 2015, showed moderate perisylvian fissure atrophy, mild to moderate small vessel disease, no acute lesions.  Laboratory showed normal or Negative RPR, B12, C. reactive protein, folic acid, TSH, ESR, ANA, acetylcholine receptor antibody, EMG nerve conduction study March 2015,  showed length dependent mild axonal peripheral neuropathy, repetitive stimulation of right accessory nerve, recording at right upper trapezius showed no abnormalities. He continue complains of fatigue, lack of stamina, with prolonged walking, he complains of low back pain, bilateral calf muscle tightness, achiness, he is taking Mestinon 60 mg 3 times a day, tolerating it well, but there was no significant improvement MRI of the lumbar, only mild degenerative disc disease, there is no significant canal, of foraminal stenosis, Athena diagnostic testing showed negative musk antibody, borderline antibody to acetylcholine receptor. CT of the chest showed mild cardiomegaly, no acute lesions, no thymus pathology noticed. He also complains of daytime sleepiness, snoring, ESS score is 12, he tends to sleep while sitting down reading, watching TV, even sitting inactive in public places, lying down rest in the afternoon, after lunch, FSS score is 47, he also complains of dry mouth, frequent awakening at night time, he had sleep study, was put on CPAP machine,  UPDATE Jul 01 2014: He continues to get weaker, he complains of dizziness when getting up, lightheaded sensation, no spinning around, he has unsteady gait, rely on his cane, mild short-term memory trouble. His maternal grandmother, mother had Alzheimer's disease. I have reviewed the [facility] neuromuscular  consultation in August 2015, and  single-fiber EMG by Dr. Vallarie Mare, the conclusion was normal study. The main jitter was 33.48m, less than the normal range of 41, only one pair was abnormal at 106, and that particular repair showed blocking, 2 other pairs approached the upper limits of normal, and did not show blocking.  UPDATE March 22nd 2016: Right knee replacement in Oct 16th 2015, he recovered very well, no significant right knee pain, but he continue have gait difficulty, fell twice since, in addition, since January 2016, he developed worsening bowel and bladder incontinence,   Last visit was with CHoyle Sauerin November 2015, he was diagnosed with obstructive sleep apnea and uses CPAP. Using CPAP machine, overall much improved quality of sleep,  He complains of dizziness, fussy, at different times, mostly happened when he stands, blurry vision, lasting for a few minutes, worsening memory trouble, today's Mini-Mental status examination is 28 out of 30, animal naming is 175 UPDATE April 28th 2016: He complains of feeling all the time, increased balance difficulty, rely on his cane, especially in past 2 weeks,he has no incontinence, he also complains of numbness at his feet, lasting for few minutes, vision is blurred, difficulty to read, even with new glasses prescirptions, he has lost sense of smell, he now sleeps well with CPAP  We have reviewed MRI of cervical spine, multilevel degenerative changes, foraminal stenosis at different levels, but no significant canal stenosis  UPDATE May 15 2015: He is with his wife, complains of dizziness when standing up quickly, there was no orthostatic blood pressure changes today, On further questioning, his complains of dizziness is unsteady gait, he has gradually declined functional status, has become more sedentary, unsure on his feet He continue blurry vision, could no longer read much   REVIEW OF SYSTEMS: Full 14 system review of systems performed and notable only for those listed, all  others are neg: Fatigue, ringing ears, runny nose, drooling, light sensitivity, double vision, blurry vision, incontinence of bowel and bladder, apnea, daytime sleepiness, back pain, memory loss, dizziness, headaches, numbness, speech difficulty, weakness, drooling, decreased concentration   ALLERGIES: No Known Allergies  HOME MEDICATIONS: Outpatient Prescriptions Prior to Visit  Medication Sig Dispense Refill  . carbidopa-levodopa (SINEMET) 25-100 MG per tablet Take 1 tablet by mouth 3 (three) times daily. 90 tablet 6  . gabapentin (NEURONTIN) 300 MG capsule Take 300 mg by mouth 2 (two) times daily.    .Marland Kitchenlevothyroxine (SYNTHROID, LEVOTHROID) 75 MCG tablet Take 75 mcg by mouth daily before breakfast.    . Multiple Vitamins-Minerals (MULTIVITAMIN PO) Take by mouth daily.    .Marland KitchenOVER THE COUNTER MEDICATION Taking Persavision tablets once daily.    . simvastatin (ZOCOR) 20 MG tablet Take 20 mg by mouth every evening.     No facility-administered medications prior to visit.    PAST MEDICAL HISTORY: Past Medical History  Diagnosis Date  . Hyperlipemia   . Thyroid disease   . Neuropathy   . Dizziness   . Weakness   . Ptosis 2013    PAST SURGICAL HISTORY: Past Surgical History  Procedure Laterality Date  . Tonsillectomy and adenoidectomy    . Cyst removal hand    . Throat surgery    . Larynx surgery    . Total knee arthroplasty      Right    FAMILY HISTORY: Family History  Problem Relation Age of Onset  . Seizures Mother   . Cancer - Other Father     SOCIAL  HISTORY: History   Social History  . Marital Status: Married    Spouse Name: [name]  . Number of Children: 1  . Years of Education: masters   Occupational History  .      Retired - Theme park manager   Social History Main Topics  . Smoking status: Never Smoker   . Smokeless tobacco: Never Used  . Alcohol Use: No  . Drug Use: No  . Sexual Activity: Not on file   Other Topics Concern  . 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 PHYSICAL EXAMNIATION:  Gen: NAD, conversant, well nourised, obese, well groomed                     Cardiovascular: Regular rate rhythm, no peripheral edema, warm, nontender. Eyes: Conjunctivae clear without exudates or hemorrhage Neck: Supple, no carotid bruise. Pulmonary: Clear to auscultation bilaterally   NEUROLOGICAL EXAM:  MENTAL STATUS: Speech:    Speech is normal; fluent and spontaneous with normal comprehension.  Cognition: Oriented to time and place, normal language, fund of knowledge, normal attention span and concentrations,  CRANIAL NERVES: CN II: Visual fields are full to confrontation. Fundoscopic exam is normal with sharp discs and no vascular changes. Venous pulsations are present bilaterally. Pupils are 4 mm and briskly reactive to light. Visual acuity is 20/20 bilaterally. CN III, IV, VI: extraocular movement are normal. Left ptosis, CN V: Facial sensation is intact to pinprick in all 3 divisions bilaterally. Corneal responses are intact.  CN VII: Face is symmetric, mild bilateral eye-closure, cheek puff weakness, he has static left ptosis  CN VIII: Hearing is normal to rubbing fingers CN IX, X: Palate elevates symmetrically. Phonation is normal. CN XI: Head turning and shoulder shrug are intact CN XII: Tongue is midline with normal movements and no atrophy.  MOTOR: Normal muscle bulk, and strength, he has mild limb and nuchal rigidity,  REFLEXES: Reflexes are hypoactive and symmetric. Plantar responses are flexor.  SENSORY: Light touch, pinprick, position sense, and vibration sense are intact in fingers and  toes.  COORDINATION: Rapid alternating movements and fine finger movements are intact. There is no dysmetria on finger-to-nose and heel-knee-shin. There are no abnormal or extraneous movements.   GAIT/STANCE: Stoop forward, bilateral valgrus knee, cautious, mildly unsteady, decreased bilateral arm swing  DIAGNOSTIC DATA (LABS, IMAGING, TESTING) - I reviewed patient records, labs, notes, testing and imaging myself where available. MRI brain March 2015:  moderate perisylvian fissure atrophy, mild to moderate small vessel disease, no acute lesions.  MRI cervical spines April 2016: C5-6: severe biforaminal stenosis and mild spinal stenosis; no cord signal abnormalities. C4-5: moderate biforaminal stenosis. At C3-4: moderate right and mild left foraminal stenosis. At C6-7: moderate left foraminal stenosis.  MRI lumbar 2015:  L3-4: facet hypertrophy with no spinal stenosis or foraminal narrowing. L4-5: disc bulging and facet hypertrophy with no spinal stenosis or foraminal narrowing   ASSESSMENT AND PLAN  Peripheral neuropathy Diplopia  Extensive evaluations, no evidence of neuromuscular junctional disorder, acetylcholine receptor antibody was negative, Athena diagnostics testing  showed borderline  acetylcholine receptor binding antibody in the past,  Consider repeat acetylcholine receptor binding antibody  Obstructive sleep apnea  Using CPAP machine,  Worsening Gait difficulty  Multifactorial, includes peripheral neuropathy,valgrus knee, deconditioning there was no evidence of orthostatic blood pressure changes  Have encouraged him continue moderate exercise  Stop gabapentin  He has mild parkinsonian features, was not sure whether Sinemet 25/100 mg 3 times a day has been helpful, will continue at current dose, may consider tapering it off if  It does not make any changes    Marcial Pacas, M.D. Ph.D.  [facility] [place], [place] [address] Phone:  [phone] Fax:      [phone]

## 2015-05-15 NOTE — Telephone Encounter (Signed)
[name] is calling back per nurse request today at visit to advise pt is taking Sinemet generic

## 2015-05-15 NOTE — Telephone Encounter (Signed)
Spoke to patient and his wife - he will continue his current dose of Sinemet.

## 2015-05-28 DIAGNOSIS — H04123 Dry eye syndrome of bilateral lacrimal glands: Secondary | ICD-10-CM | POA: Diagnosis not present

## 2015-05-28 DIAGNOSIS — H40013 Open angle with borderline findings, low risk, bilateral: Secondary | ICD-10-CM | POA: Diagnosis not present

## 2015-05-28 DIAGNOSIS — H1852 Epithelial (juvenile) corneal dystrophy: Secondary | ICD-10-CM | POA: Diagnosis not present

## 2015-06-18 DIAGNOSIS — E034 Atrophy of thyroid (acquired): Secondary | ICD-10-CM | POA: Diagnosis not present

## 2015-06-18 DIAGNOSIS — K219 Gastro-esophageal reflux disease without esophagitis: Secondary | ICD-10-CM | POA: Diagnosis not present

## 2015-06-18 DIAGNOSIS — R7301 Impaired fasting glucose: Secondary | ICD-10-CM | POA: Diagnosis not present

## 2015-06-18 DIAGNOSIS — E038 Other specified hypothyroidism: Secondary | ICD-10-CM | POA: Diagnosis not present

## 2015-06-18 DIAGNOSIS — E782 Mixed hyperlipidemia: Secondary | ICD-10-CM | POA: Diagnosis not present

## 2015-06-18 DIAGNOSIS — L309 Dermatitis, unspecified: Secondary | ICD-10-CM | POA: Diagnosis not present

## 2015-06-30 DIAGNOSIS — Z23 Encounter for immunization: Secondary | ICD-10-CM | POA: Diagnosis not present

## 2015-07-02 DIAGNOSIS — Z96651 Presence of right artificial knee joint: Secondary | ICD-10-CM | POA: Diagnosis not present

## 2015-07-02 DIAGNOSIS — M25561 Pain in right knee: Secondary | ICD-10-CM | POA: Diagnosis not present

## 2015-09-20 ENCOUNTER — Other Ambulatory Visit: Payer: Self-pay | Admitting: Neurology

## 2015-10-20 ENCOUNTER — Other Ambulatory Visit: Payer: Self-pay | Admitting: Neurology

## 2015-11-20 ENCOUNTER — Encounter: Payer: Self-pay | Admitting: Neurology

## 2015-11-20 ENCOUNTER — Ambulatory Visit (INDEPENDENT_AMBULATORY_CARE_PROVIDER_SITE_OTHER): Payer: Medicare Other | Admitting: Neurology

## 2015-11-20 VITALS — BP 126/64 | HR 80 | Ht 70.0 in | Wt 174.0 lb

## 2015-11-20 DIAGNOSIS — R269 Unspecified abnormalities of gait and mobility: Secondary | ICD-10-CM | POA: Diagnosis not present

## 2015-11-20 DIAGNOSIS — G3184 Mild cognitive impairment, so stated: Secondary | ICD-10-CM | POA: Diagnosis not present

## 2015-11-20 MED ORDER — MEMANTINE HCL 10 MG PO TABS: 10.0000 mg | ORAL_TABLET | Freq: Two times a day (BID) | ORAL | Status: DC

## 2015-11-20 NOTE — Progress Notes (Signed)
Chief Complaint  Patient presents with  . Gait Problem    He is here with his wife, [name].  Feels his gait has continued to decline and he often feels unsteady on his feet.  His legs feel especially weak below the knees.  He is still currently taking Sinemet 25-100, one tablet TID.     [facility]  PATIENT: Ian Gibbs DOB: 06-16-1941   REASON FOR VISIT: Follow-up for diplopia dizziness, history of obstructive sleep apnea   HISTORY OF PRESENT ILLNESS: Esley Brooking 75 yo RH WM with wife, is referred by his primary care phy sician Dr. Morrie Sheldon for evaluation of peripheral neuropathy, ptosis, blurry vision, dizziness, generalized weakness, Initial visit, March 2015.  He had past medical history of hypothyroidism, hyperlipidemia [place], he developed gradual onset difficulty talking, his voice varies from high pitch to low, he also has mild dysphagia, [place], was treated at the [facility] by ENT, was diagnosed with bilateral vocal cord paralysis of unknown etiology, had surgery, which helped his symptoms some In 2012, he noticed bilateral feet discomfort, numbness tingling, burning discomfort, getting worse after bearing weight, gradually getting worse, he was evaluated by [facility] Dr. Ellender Hose, reported abnormal EMG nerve conduction study consistent with peripheral neuropathy, notes also reported extensive laboratory evaluation, including vitamin B1, IFEP was normal, Over the years, his symptoms has been controlled with titrating dose of gabapentin, currently he is taking 600 mg 3 times a day.  In 2013, he also developed generalized weakness, intermittent ptosis, occasionally gait difficulty, getting worse over past 1 year, over past 6 months, he noticed worsening ptosis, also intermittent double vision, especially with prolonged reading, speech is slurred, mild swallowing difficulties, gait  difficulty, he also complains of dizziness, lightheadedness after prolonged standing,  MRI of the brain March 2015, showed moderate perisylvian fissure atrophy, mild to moderate small vessel disease, no acute lesions.  Laboratory showed normal or Negative RPR, B12, C. reactive protein, folic acid, TSH, ESR, ANA, acetylcholine receptor antibody, EMG nerve conduction study March 2015,  showed length dependent mild axonal peripheral neuropathy, repetitive stimulation of right accessory nerve, recording at right upper trapezius showed no abnormalities. He continue complains of fatigue, lack of stamina, with prolonged walking, he complains of low back pain, bilateral calf muscle tightness, achiness, he is taking Mestinon 60 mg 3 times a day, tolerating it well, but there was no significant improvement MRI of the lumbar, only mild degenerative disc disease, there is no significant canal, of foraminal stenosis, Athena diagnostic testing showed negative musk antibody, borderline antibody to acetylcholine receptor. CT of the chest showed mild cardiomegaly, no acute lesions, no thymus pathology noticed. He also complains of daytime sleepiness, snoring, ESS score is 12, he tends to sleep while sitting down reading, watching TV, even sitting inactive in public places, lying down rest in the afternoon, after lunch, FSS score is 47, he also complains of dry mouth, frequent awakening at night time, he had sleep study, was put on CPAP machine,  UPDATE Jul 01 2014: He continues to get weaker, he complains of dizziness when getting up, lightheaded sensation, no spinning around, he has unsteady gait, rely on his cane, mild short-term memory trouble. His maternal grandmother, mother had Alzheimer's disease. I have reviewed the [facility] neuromuscular consultation in August 2015, and single-fiber EMG by Dr. Vallarie Mare, the conclusion was normal study. The main jitter was 33.64m, less than the normal range  of 41, only  one pair was abnormal at 106, and that particular repair showed blocking, 2 other pairs approached the upper limits of normal, and did not show blocking.  UPDATE March 22nd 2016: Right knee replacement in Oct 16th 2015, he recovered very well, no significant right knee pain, but he continue have gait difficulty, fell twice since, in addition, since January 2016, he developed worsening bowel and bladder incontinence,   Last visit was with Hoyle Sauer in November 2015, he was diagnosed with obstructive sleep apnea and uses CPAP. Using CPAP machine, overall much improved quality of sleep,  He complains of dizziness, fussy, at different times, mostly happened when he stands, blurry vision, lasting for a few minutes, worsening memory trouble, today's Mini-Mental status examination is 28 out of 30, animal naming is 58  UPDATE April 28th 2016: He complains of feeling all the time, increased balance difficulty, rely on his cane, especially in past 2 weeks,he has no incontinence, he also complains of numbness at his feet, lasting for few minutes, vision is blurred, difficulty to read, even with new glasses prescirptions, he has lost sense of smell, he now sleeps well with CPAP  We have reviewed MRI of cervical spine, multilevel degenerative changes, foraminal stenosis at different levels, but no significant canal stenosis  UPDATE May 15 2015: He is with his wife, complains of dizziness when standing up quickly, there was no orthostatic blood pressure changes today, On further questioning, his complains of dizziness is unsteady gait, he has gradually declined functional status, has become more sedentary, unsure on his feet He continue blurry vision, could no longer read much   UPDATE Feb 9th 2017: He continues to decline slowly, he has more slow walking, mildly increased gait difficulty, he denies significant pain, he denies incontinence, does has urinary urgency, he has no dysphagia, no chewing difficulty,   No limb wekaness, he sleeps well now with CPAP machine,  He has mild memory trouble, he enjoys reading, but has trouble keeping up sometimes, got confused about his driving direction occasionally   He has strong family history of dementia, his maternal grandmother, mother also suffered Alzheimer's disease.  REVIEW OF SYSTEMS: Full 14 system review of systems performed and notable only for those listed, all others are neg: Activity change, fatigue, ringing ears, drooling, double vision, eye pain, blurred vision, apnea, daytime sleepiness, difficulty urinating, walking difficulty, rash, dizziness, weakness, facial drooping, decreased concentration.   ALLERGIES: No Known Allergies  HOME MEDICATIONS: Outpatient Prescriptions Prior to Visit  Medication Sig Dispense Refill  . carbidopa-levodopa (SINEMET IR) 25-100 MG tablet TAKE ONE TABLET BY MOUTH THREE TIMES DAILY 90 tablet 1  . gabapentin (NEURONTIN) 300 MG capsule Take 300 mg by mouth 3 (three) times daily.     Marland Kitchen levothyroxine (SYNTHROID, LEVOTHROID) 75 MCG tablet Take 75 mcg by mouth daily before breakfast.    . Multiple Vitamins-Minerals (MULTIVITAMIN PO) Take by mouth daily.    Marland Kitchen OVER THE COUNTER MEDICATION Taking Persavision tablets once daily.    . simvastatin (ZOCOR) 20 MG tablet Take 20 mg by mouth every evening.     No facility-administered medications prior to visit.    PAST MEDICAL HISTORY: Past Medical History  Diagnosis Date  . Hyperlipemia   . Thyroid disease   . Neuropathy ([place])   . Dizziness   . Weakness   . Ptosis 2013    PAST SURGICAL HISTORY: Past Surgical History  Procedure Laterality Date  . Tonsillectomy and adenoidectomy    . Cyst  removal hand    . Throat surgery    . Larynx surgery    . Total knee arthroplasty      Right    FAMILY HISTORY: Family History  Problem Relation Age of Onset  . Seizures Mother   . Cancer - Other Father     SOCIAL HISTORY: Social History   Social History  .  Marital Status: Married    Spouse Name: [name]  . Number of Children: 1  . Years of Education: masters   Occupational History  .      Retired - Theme park manager   Social History Main Topics  . Smoking status: Never Smoker   . Smokeless tobacco: Never Used  . Alcohol Use: No  . Drug Use: No  . Sexual Activity: Not on file   Other Topics Concern  . 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 PHYSICAL EXAMNIATION:  Gen: NAD, conversant, well nourised, obese, well groomed                     Cardiovascular: Regular rate rhythm, no peripheral edema, warm, nontender. Eyes: Conjunctivae clear without exudates or hemorrhage Neck: Supple, no carotid bruise. Pulmonary: Clear to auscultation bilaterally   NEUROLOGICAL EXAM:  MENTAL STATUS: Speech:    Speech is soft, fluent and spontaneous with normal comprehension.  Cognition: Mini-Mental Status Examination is 27 out of 30     Orientation to time, place and person: He is not oriented to doctor's office     Recent and remote memory, she missed 2 out of 3 recalls     Normal Attention span and concentration     Normal Language, naming, repeating,spontaneous speech     Fund of knowledge  CRANIAL NERVES: CN II: Visual fields are full to confrontation. Fundoscopic exam is normal with sharp discs and no vascular changes. Venous pulsations are present bilaterally. Pupils are 4 mm and briskly reactive to light. Visual acuity is 20/20 bilaterally. CN III, IV, VI: extraocular movement are normal. Left ptosis, CN V: Facial sensation is intact to pinprick in all 3 divisions bilaterally. Corneal responses are intact.  CN VII: Face is  symmetric, mild bilateral eye-closure, cheek puff weakness, he has static left ptosis  CN VIII: Hearing is normal to rubbing fingers CN IX, X: Palate elevates symmetrically. Phonation is normal. CN XI: Head turning and shoulder shrug are intact CN XII: Tongue is midline with normal movements and no atrophy.  MOTOR: Normal muscle bulk, and strength, he has mild limb and nuchal rigidity,  REFLEXES: Reflexes are hypoactive and symmetric. Plantar responses are flexor.  SENSORY: Light touch, pinprick, position sense, and vibration sense are intact in fingers and toes.  COORDINATION: Rapid alternating movements and fine finger movements are intact. There is no dysmetria on finger-to-nose and heel-knee-shin. There are no abnormal or extraneous movements.   GAIT/STANCE: Stoop forward, bilateral valgrus knee, cautious, mildly unsteady, decreased bilateral arm swing, tends to lean his body towards the right side  DIAGNOSTIC DATA (LABS, IMAGING, TESTING) - I reviewed patient records, labs,  notes, testing and imaging myself where available. MRI brain March 2015:  moderate perisylvian fissure atrophy, mild to moderate small vessel disease, no acute lesions.  MRI cervical spines April 2016: C5-6: severe biforaminal stenosis and mild spinal stenosis; no cord signal abnormalities. C4-5: moderate biforaminal stenosis. At C3-4: moderate right and mild left foraminal stenosis. At C6-7: moderate left foraminal stenosis.  MRI lumbar 2015:  L3-4: facet hypertrophy with no spinal stenosis or foraminal narrowing. L4-5: disc bulging and facet hypertrophy with no spinal stenosis or foraminal narrowing   ASSESSMENT AND PLAN Mild cognitive impairment:  He has strong family history of Alzheimer's disease,  Today Mini-Mental Status Examination is 7 out of 30  I was started Namenda 10 mg twice a day  For him to neuropsychology evaluation Diplopia  Extensive evaluations, no evidence of neuromuscular junctional  disorder, acetylcholine receptor antibody was negative, Athena diagnostics testing showed borderline acetylcholine receptor binding antibody in the past,  Consider repeat acetylcholine receptor binding antibody  Obstructive sleep apnea  Using CPAP machine,  Worsening Gait difficulty  Multifactorial, includes peripheral neuropathy,valgrus knee, deconditioning there was no evidence of orthostatic blood pressure changes  Have encouraged him continue moderate exercise  Stop gabapentin  He has mild parkinsonian features, was not sure whether Sinemet 25/100 mg 3 times a day has been helpful, will continue at current dose, may consider tapering it off if  It does not make any changes    Marcial Pacas, M.D. Ph.D.  [facility] [place], [place] [address] Phone: [phone] Fax:      [phone]

## 2015-11-21 DIAGNOSIS — H02833 Dermatochalasis of right eye, unspecified eyelid: Secondary | ICD-10-CM | POA: Diagnosis not present

## 2015-11-21 DIAGNOSIS — Z961 Presence of intraocular lens: Secondary | ICD-10-CM | POA: Diagnosis not present

## 2015-11-21 DIAGNOSIS — H1852 Epithelial (juvenile) corneal dystrophy: Secondary | ICD-10-CM | POA: Diagnosis not present

## 2015-11-21 DIAGNOSIS — H353121 Nonexudative age-related macular degeneration, left eye, early dry stage: Secondary | ICD-10-CM | POA: Diagnosis not present

## 2015-11-21 DIAGNOSIS — H40013 Open angle with borderline findings, low risk, bilateral: Secondary | ICD-10-CM | POA: Diagnosis not present

## 2015-11-21 DIAGNOSIS — H01003 Unspecified blepharitis right eye, unspecified eyelid: Secondary | ICD-10-CM | POA: Diagnosis not present

## 2015-11-21 DIAGNOSIS — H353111 Nonexudative age-related macular degeneration, right eye, early dry stage: Secondary | ICD-10-CM | POA: Diagnosis not present

## 2015-11-28 ENCOUNTER — Telehealth: Payer: Self-pay | Admitting: *Deleted

## 2015-11-28 ENCOUNTER — Encounter: Payer: Self-pay | Admitting: *Deleted

## 2015-11-28 DIAGNOSIS — Z86010 Personal history of colonic polyps: Secondary | ICD-10-CM | POA: Diagnosis not present

## 2015-11-28 DIAGNOSIS — K5730 Diverticulosis of large intestine without perforation or abscess without bleeding: Secondary | ICD-10-CM | POA: Diagnosis not present

## 2015-11-28 DIAGNOSIS — K640 First degree hemorrhoids: Secondary | ICD-10-CM | POA: Diagnosis not present

## 2015-11-28 DIAGNOSIS — K644 Residual hemorrhoidal skin tags: Secondary | ICD-10-CM | POA: Diagnosis not present

## 2015-11-28 NOTE — Telephone Encounter (Signed)
Unable to reach patient at time of pre-visit call. Left message for patient to return call when available.  

## 2015-12-01 ENCOUNTER — Encounter: Payer: Medicare Other | Admitting: Medical

## 2015-12-01 NOTE — Progress Notes (Signed)
This encounter was created in error - please disregard.

## 2015-12-02 ENCOUNTER — Telehealth: Payer: Self-pay | Admitting: Medical

## 2015-12-02 ENCOUNTER — Telehealth: Payer: Self-pay | Admitting: *Deleted

## 2015-12-02 NOTE — Telephone Encounter (Signed)
No charge. 

## 2015-12-02 NOTE — Telephone Encounter (Signed)
LMVM for wife to call back relating to schedule change (keep appt date and time but with CM/NP).

## 2015-12-02 NOTE — Telephone Encounter (Signed)
I believe pt came in late for appt 12/01/15 scheduled for 3:00pm, pt is rescheduled for 12/04/15, charge or no charge?

## 2015-12-03 ENCOUNTER — Encounter: Payer: Self-pay | Admitting: Nurse Practitioner

## 2015-12-03 ENCOUNTER — Ambulatory Visit (INDEPENDENT_AMBULATORY_CARE_PROVIDER_SITE_OTHER): Payer: Medicare Other | Admitting: Nurse Practitioner

## 2015-12-03 VITALS — BP 110/62 | HR 89 | Ht 70.0 in | Wt 176.2 lb

## 2015-12-03 DIAGNOSIS — G4733 Obstructive sleep apnea (adult) (pediatric): Secondary | ICD-10-CM

## 2015-12-03 DIAGNOSIS — G629 Polyneuropathy, unspecified: Secondary | ICD-10-CM | POA: Diagnosis not present

## 2015-12-03 DIAGNOSIS — R269 Unspecified abnormalities of gait and mobility: Secondary | ICD-10-CM | POA: Diagnosis not present

## 2015-12-03 NOTE — Telephone Encounter (Signed)
I have not seen this pt. Thus I feel like I should not be labeled as his pcp. If he comes in that is fine. But can this be removed until I actually see him

## 2015-12-03 NOTE — Telephone Encounter (Signed)
Pre-Visit Call previously completed with patient's and chart updated.   Pre-Visit Info documented in Specialty Comments under [employer].

## 2015-12-03 NOTE — Progress Notes (Addendum)
[facility]  PATIENT: Ian Gibbs DOB: [birth year]   REASON FOR VISIT: Follow-up for obstructive sleep apnea and CPAP , history of neuropathy and gait disorder HISTORY FROM: Patient and wife    HISTORY OF PRESENT ILLNESS: Ian Gibbs, 75 year old male returns for follow-up. He was last seen in the office by Dr. Rexene Alberts 11/26/2014 for his CPAP compliance. He has an underlying medical condition of obesity and  peripheral neuropathy. He reports improved sleep quality less daytime drowsiness and less fatigue. He does claim today that if he is very tired at night he goes to bed without using his CPAP. I reviewed his compliance data from 11/03/2015 to 12/01/2014 for a total of 30 days during which time the patient used CPAP for 20 days. Average usage for all days was 5 hours 51 minutes. The percent used regular than 4 hours was 67% indicating fair compliance. AHI 1.6, set pressure 6 cm. Air leak was low . He reports no issues tolerating the mask or the pressure. His neuropathy symptoms are unchanged. He returns for yearly compliance check    HISTORY: Dr Rexene Alberts Ian Gibbs is a 75 year old right-handed gentleman with an underlying medical history of obesity, HLP, PN and thyroid disease, who presents for follow-up consultation of his obstructive sleep apnea, treated with CPAP. He is accompanied by his wife today. I first met him on 05/30/2014 at the request of Dr. Krista Blue, at which time we talked about his split-night sleep study results. We also talked about his compliance data. He reported improved sleep quality, less daytime somnolence, and his wife endorse that he was sleeping better. After his wife got sick he did not use his machine as well but overall was compliant with treatment. He was motivated to continue with treatment as he had noticed an improvement in his sleep.   Today, I reviewed his compliance data from 10/29/2014 through 11/27/2014 which is a total of 30 days during which  time he used his machine every night except for one with percent used days greater than 4 hours of 93%, indicating excellent compliance with an average usage of 6 hours and 16 minutes of pressure of 6 cm. Residual AHI low at 1.8 per hour and leak low with the 95th percentile at 7.9 L/m. Today, he reports doing fairly well, no issues tolerating the mask and pressure. He is compliant with treatment. He has some mental sluggishness sometimes per wife and admits to not always drinking enough water. His neuropathy seems unchanged to him.  He had a split-night sleep study on 03/08/2014. Baseline sleep efficiency was 56.2% only with a latency to sleep prolonged at 93.5 minutes and wake after sleep onset of 8 minutes with moderate sleep fragmentation noted. He had any increased percentage of light stage sleep and absence of deep sleep and REM sleep. He had rare PVCs. He had mild to moderate snoring. He had a total AHI based on 62 obstructive hypopneas of 28.6 per hour. Baseline oxygen saturation was 93%, nadir was 86%. He was an titrated on CPAP and sleep efficiency was improved at 95.8%. His arousal index was improved. He achieved an increased percentage of deep sleep and a normal percentage of REM sleep. Average oxygen saturation was 94%, nadir was 84%. He was titrated on CPAP from 5-6 cm with a reduction of his AHI to 0 events per hour on the final pressure with supine REM sleep achieved.   REVIEW OF SYSTEMS: Full 14 system review of systems performed and notable only for  those listed, all others are neg:  Constitutional: neg  Cardiovascular: neg Ear/Nose/Throat: neg  Skin: neg Eyes: Blurred vision Respiratory: neg Gastroitestinal: neg  Hematology/Lymphatic: neg  Endocrine: neg Musculoskeletal walking difficulty Allergy/Immunology: neg Neurological: Numbness, memory loss Psychiatric: neg Sleep : Obstructive sleep apnea with CPAP   ALLERGIES: No Known Allergies  HOME MEDICATIONS: Outpatient  Prescriptions Prior to Visit  Medication Sig Dispense Refill  . carbidopa-levodopa (SINEMET IR) 25-100 MG tablet TAKE ONE TABLET BY MOUTH THREE TIMES DAILY 90 tablet 1  . gabapentin (NEURONTIN) 300 MG capsule Take 300 mg by mouth 3 (three) times daily.     Marland Kitchen levothyroxine (SYNTHROID, LEVOTHROID) 75 MCG tablet Take 75 mcg by mouth daily before breakfast.    . memantine (NAMENDA) 10 MG tablet Take 1 tablet (10 mg total) by mouth 2 (two) times daily. 60 tablet 11  . Multiple Vitamins-Minerals (MULTIVITAMIN PO) Take by mouth daily.    Marland Kitchen OVER THE COUNTER MEDICATION Taking Persavision tablets once daily.    . simvastatin (ZOCOR) 20 MG tablet Take 10 mg by mouth every evening.      No facility-administered medications prior to visit.    PAST MEDICAL HISTORY: Past Medical History  Diagnosis Date  . Hyperlipemia   . Thyroid disease   . Neuropathy ([place])   . Dizziness   . Weakness   . Ptosis 2013    PAST SURGICAL HISTORY: Past Surgical History  Procedure Laterality Date  . Tonsillectomy and adenoidectomy    . Cyst removal hand    . Throat surgery    . Larynx surgery    . Total knee arthroplasty      Right    FAMILY HISTORY: Family History  Problem Relation Age of Onset  . Seizures Mother   . Cancer - Other Father   . Cancer - Other Maternal Grandmother     SOCIAL HISTORY: Social History   Social History  . Marital Status: Married    Spouse Name: [name]  . Number of Children: 1  . Years of Education: masters   Occupational History  .      Retired - Theme park manager   Social History Main Topics  . Smoking status: Never Smoker   . Smokeless tobacco: Never Used  . Alcohol Use: No  . Drug Use: No  . Sexual Activity: Not on file   Other Topics Concern  . 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  Generalized: Well developed, in no acute distress , well groomed Head: normocephalic and atraumatic,. Oropharynx benign Mallopatti 2 Neck: Supple, no carotid bruits  Cardiac: Regular rate rhythm, no murmur  Musculoskeletal: No deformity   Neurological examination   Mentation: Alert oriented to time, place, history taking. Attention span and concentration appropriate. Recent and remote memory intact.  Follows all commands speech and language fluent. ESS 7. FSS 37 Cranial nerve II-XII: Pupils were equal round reactive to light extraocular movements were full, visual field were full on confrontational test. Facial sensation and strength were normal. hearing was intact to finger rubbing bilaterally. Uvula tongue midline. head  turning and shoulder shrug were normal and symmetric.Tongue protrusion into cheek strength was normal. Motor: normal bulk and tone, full strength in the BUE, BLE, fine finger movements normal, no pronator drift. No focal weakness Sensory: Decreased to all modalities in the lower extremities to mid calf area , normal and symmetric to light touch, pinprick, and  Vibration, in the upper extremities Coordination: finger-nose-finger, mildly impaired heel-to-shin normal  bilaterally, no dysmetria Reflexes: Brachioradialis 2/2, biceps 2/2, triceps 2/2, patellar 1/1, Achilles 1/1, plantar responses were flexor bilaterally. Gait and Station: Rising up from seated position without assistance, wide based  stance,  moderate stride, 3-point turning, posture is stooped, walks with 3-point pain. Romberg negative DIAGNOSTIC DATA (LABS, IMAGING, TESTING) -  ASSESSMENT AND PLAN  75 y.o. year old male  has a past medical history of Hyperlipemia; Thyroid disease; Neuropathy ([place]); Dizziness; Weakness; and Ptosis (2013). moderate obstructive sleep apnea with CPAP treatment at  pressure of 6 cm with fair compliance. His daytime sleepiness and his fatigue has improved. 10 days of the month  he did not use his CPAP.    He is advised not to skip any days, compliance data was discussed, over all the days of usage avergages 5 hours 51 minutes which is good.  Untreated obstructive sleep apnea when it is moderate to severe can have an adverse impact on cardiovascular health and raise her risk for heart disease, arrhythmias, hypertension, congestive heart failure, stroke and diabetes. Untreated obstructive sleep apnea causes sleep disruption, nonrestorative sleep, and sleep deprivation Follow-up with Dr. Rexene Alberts in 1 year  for CPAP compliance Use cane at all times for safe ambulationVst time 20 min Ian Gibbs, [facility], [facility], [facility] [address], [employer] [place], [place] [address] [phone]  I reviewed the above note and documentation by the Nurse Practitioner and agree with the history, physical exam, assessment and plan as outlined above. I was immediately available for face-to-face consultation. [name], MD, PhD [facility])

## 2015-12-03 NOTE — Patient Instructions (Addendum)
10 days of no usage He is advised not to skip any days, compliance data was discussed, over all the days of usage avergages 5 hours 51 minutes which is good.  Untreated obstructive sleep apnea when it is moderate to severe can have an adverse impact on cardiovascular health and raise her risk for heart disease, arrhythmias, hypertension, congestive heart failure, stroke and diabetes. Untreated obstructive sleep apnea causes sleep disruption, nonrestorative sleep, and sleep deprivation Follow-up with Dr. Rexene Alberts in 1 year  for CPAP compliance Use cane at all times for safe ambulation

## 2015-12-04 ENCOUNTER — Ambulatory Visit (INDEPENDENT_AMBULATORY_CARE_PROVIDER_SITE_OTHER): Payer: Medicare Other | Admitting: Medical

## 2015-12-04 ENCOUNTER — Encounter: Payer: Self-pay | Admitting: Medical

## 2015-12-04 VITALS — BP 122/70 | HR 67 | Temp 98.0°F | Ht 70.0 in | Wt 180.0 lb

## 2015-12-04 DIAGNOSIS — E039 Hypothyroidism, unspecified: Secondary | ICD-10-CM | POA: Diagnosis not present

## 2015-12-04 DIAGNOSIS — E785 Hyperlipidemia, unspecified: Secondary | ICD-10-CM | POA: Diagnosis not present

## 2015-12-04 DIAGNOSIS — H04123 Dry eye syndrome of bilateral lacrimal glands: Secondary | ICD-10-CM | POA: Diagnosis not present

## 2015-12-04 DIAGNOSIS — G300 Alzheimer's disease with early onset: Secondary | ICD-10-CM | POA: Diagnosis not present

## 2015-12-04 DIAGNOSIS — F0280 Dementia in other diseases classified elsewhere without behavioral disturbance: Secondary | ICD-10-CM | POA: Insufficient documentation

## 2015-12-04 DIAGNOSIS — G309 Alzheimer's disease, unspecified: Secondary | ICD-10-CM

## 2015-12-04 DIAGNOSIS — K219 Gastro-esophageal reflux disease without esophagitis: Secondary | ICD-10-CM

## 2015-12-04 NOTE — Assessment & Plan Note (Signed)
Next week fasting lipid panel.

## 2015-12-04 NOTE — Assessment & Plan Note (Signed)
Pt on nemenda.

## 2015-12-04 NOTE — Patient Instructions (Signed)
Will put in future order of labs for cholesterol and tsh check. Can get done next week.  For dry eyes continue systane.  For alzheimer's and parkinson continue meds rx'd by neurologist.  Will try to schedule you with RN Ian Gibbs for wellness exam(follow up before wellness if needed). Then I will see about 2-3 weeks after you see Ian Gibbs.

## 2015-12-04 NOTE — Assessment & Plan Note (Signed)
Rare vomting after eating. Wife states since he started eating healthier this has improved. Will follow this. If reoccurs will consider ppi or ranitidine.

## 2015-12-04 NOTE — Assessment & Plan Note (Signed)
Will get tsh next week.

## 2015-12-04 NOTE — Telephone Encounter (Signed)
Done

## 2015-12-04 NOTE — Progress Notes (Signed)
Subjective:    Patient ID: Ian Gibbs, male    DOB: April 27, 1941, 75 y.o.   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  Pt states feeling well over all.  Pt states sometime after eating will vomit. This is very rare. But he will vomit one time only very rarely. But denies any classic reflux type symptoms. Pt can't correlate it with certain foods. May occur one time a month. But wife does note that in recent months seems a lot better. He has eating healthier past 2 months.   Pt just recently diagnosed with alzheimer and parkinsons.. Pt just started namenda.  Pt has history of high cholesterol and low thyroid. Neither has been checked in some time.  Pt has dry eyes both eyes at times. Has been going on for some time. Pt uses systane eye drops. This month saw optometrist. They recommended systane. Pt in the past was on restasis. Was expensive insurance denied.      Review of Systems  Constitutional: Negative for fever, chills and fatigue.  Eyes:       Dry eyes.  Respiratory: Negative for cough, chest tightness, shortness of breath and wheezing.   Cardiovascular: Negative for chest pain and palpitations.  Gastrointestinal: Positive for vomiting. Negative for nausea, abdominal pain, diarrhea and constipation.       Fare vomiting.  Genitourinary: Negative for dysuria, frequency, hematuria and flank pain.  Musculoskeletal: Negative for back pain.  Skin: Negative for rash.  Neurological: Negative for dizziness, speech difficulty, weakness and headaches.  Hematological: Negative for adenopathy. Does not bruise/bleed easily.  Psychiatric/Behavioral: Negative for behavioral problems and confusion.       No symptoms. Now but but dementia dx   Past Medical History  Diagnosis Date  . Hyperlipemia   . Thyroid disease    . Neuropathy ([place])   . Dizziness   . Weakness   . Ptosis 2013  . Neuromuscular disorder ([place])     alzeimer, and parkinson per wife.  . Sleep apnea   . GERD (gastroesophageal reflux disease)     Social History   Social History  . Marital Status: Married    Spouse Name: [name]  . Number of Children: 1  . Years of Education: masters   Occupational History  .      Retired - Theme park manager   Social History Main Topics  . Smoking status: Never Smoker   . Smokeless tobacco: Never Used  . Alcohol Use: No  . Drug Use: No  . Sexual Activity: Not on file   Other Topics Concern  . 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 Tonsillectomy and adenoidectomy    . Cyst removal hand    . Throat surgery    . Larynx surgery    . Total knee arthroplasty      Right    Family History  Problem Relation Age of Onset  . Seizures Mother   . Cancer - Other Father   . Cancer - Other Maternal Grandmother  No Known Allergies  Current Outpatient Prescriptions on File Prior to Visit  Medication Sig Dispense Refill  . carbidopa-levodopa (SINEMET IR) 25-100 MG tablet TAKE ONE TABLET BY MOUTH THREE TIMES DAILY 90 tablet 1  . gabapentin (NEURONTIN) 300 MG capsule Take 300 mg by mouth 3 (three) times daily.     Marland Kitchen levothyroxine (SYNTHROID, LEVOTHROID) 75 MCG tablet Take 75 mcg by mouth daily before breakfast.    . memantine (NAMENDA) 10 MG tablet Take 1 tablet (10 mg total) by mouth 2 (two) times daily. 60 tablet 11  . Multiple Vitamins-Minerals (MULTIVITAMIN PO) Take by mouth daily.    Marland Kitchen OVER THE COUNTER MEDICATION Taking Persavision tablets once daily.    . simvastatin (ZOCOR) 20 MG tablet Take 10 mg by mouth every evening.      No current facility-administered medications on file prior to visit.     BP 122/70 mmHg  Pulse 67  Temp(Src) 98 F (36.7 C) (Oral)  Ht 5\' 10"  (1.778 m)  Wt 180 lb (81.647 kg)  BMI 25.83 kg/m2  SpO2 98%      Objective:   Physical Exam     General  Mental Status - Alert. General Appearance - Well groomed. Not in acute distress.  Skin Rashes- No Rashes.  HEENT Head- Normal. Ear Auditory Canal - Left- Normal. Right - Normal.Tympanic Membrane- Left- Normal. Right- Normal. Eye Sclera/Conjunctiva- Left- Normal mild ptosis. Right- Normal. Nose & Sinuses Nasal Mucosa- Left-  Boggy and Congested. Right-  Boggy and  Congested.Bilateral maxillary and frontal sinus pressure. Mouth & Throat Lips: Upper Lip- Normal: no dryness, cracking, pallor, cyanosis, or vesicular eruption. Lower Lip-Normal: no dryness, cracking, pallor, cyanosis or vesicular eruption. Buccal Mucosa- Bilateral- No Aphthous ulcers. Oropharynx- No Discharge or Erythema. Tonsils: Characteristics- Bilateral- No Erythema or Congestion. Size/Enlargement- Bilateral- No enlargement. Discharge- bilateral-None.   Neck Carotid Arteries- Normal color. Moisture- Normal Moisture. No carotid bruits. No JVD.  Chest and Lung Exam Auscultation: Breath Sounds:-Normal.  Cardiovascular Auscultation:Rythm- Regular. Murmurs & Other Heart Sounds:Auscultation of the heart reveals- No Murmurs.  Abdomen Inspection:-Inspeection Normal. Palpation/Percussion:Note:No mass. Palpation and Percussion of the abdomen reveal- Non Tender, Non Distended + BS, no rebound or guarding.    Neurologic Cranial Nerve exam:- CN III-XII intact(No nystagmus), symmetric smile. Strength:- 5/5 equal and symmetric strength both upper and lower extremities.(no rigid movement of extremities)       Assessment & Plan:  Will put in future order of labs for cholesterol and tsh check. Can get done next week.  For dry eyes continue systane.  For alzheimer's and parkinson continue meds rx'd by neurologist.  Will try to  schedule you with RN Caryl Pina for wellness exam(follow up before wellness if needed). Then I will see about 2-3 weeks after you see Caryl Pina.

## 2015-12-04 NOTE — Progress Notes (Signed)
Pre visit review using our clinic review tool, if applicable. No additional management support is needed unless otherwise documented below in the visit note. 

## 2015-12-09 ENCOUNTER — Other Ambulatory Visit (INDEPENDENT_AMBULATORY_CARE_PROVIDER_SITE_OTHER): Payer: Medicare Other

## 2015-12-09 DIAGNOSIS — E785 Hyperlipidemia, unspecified: Secondary | ICD-10-CM

## 2015-12-09 DIAGNOSIS — E039 Hypothyroidism, unspecified: Secondary | ICD-10-CM | POA: Diagnosis not present

## 2015-12-09 LAB — COMPREHENSIVE METABOLIC PANEL
ALBUMIN: 4.7 g/dL (ref 3.5–5.2)
ALK PHOS: 61 U/L (ref 39–117)
ALT: 25 U/L (ref 0–53)
AST: 25 U/L (ref 0–37)
BILIRUBIN TOTAL: 1.1 mg/dL (ref 0.2–1.2)
BUN: 16 mg/dL (ref 6–23)
CALCIUM: 9.7 mg/dL (ref 8.4–10.5)
CO2: 26 mEq/L (ref 19–32)
Chloride: 104 mEq/L (ref 96–112)
Creatinine, Ser: 1.12 mg/dL (ref 0.40–1.50)
GFR: 67.55 mL/min (ref >60.00)
Glucose, Bld: 109 mg/dL — ABNORMAL HIGH (ref 70–99)
Potassium: 3.9 mEq/L (ref 3.5–5.1)
Sodium: 141 mEq/L (ref 135–145)
TOTAL PROTEIN: 8.2 g/dL (ref 6.0–8.3)

## 2015-12-09 LAB — LIPID PANEL
CHOLESTEROL: 137 mg/dL (ref 0–200)
HDL: 47.8 mg/dL (ref >39.00)
LDL Cholesterol: 58 mg/dL (ref 0–99)
NonHDL: 89.49
TRIGLYCERIDES: 155 mg/dL — AB (ref 0.0–149.0)
Total CHOL/HDL Ratio: 3
VLDL: 31 mg/dL (ref 0.0–40.0)

## 2015-12-09 LAB — TSH: TSH: 2.3 u[IU]/mL (ref 0.35–4.50)

## 2015-12-24 ENCOUNTER — Encounter: Payer: Self-pay | Admitting: Medical

## 2015-12-24 ENCOUNTER — Ambulatory Visit (HOSPITAL_BASED_OUTPATIENT_CLINIC_OR_DEPARTMENT_OTHER)
Admission: RE | Admit: 2015-12-24 | Discharge: 2015-12-24 | Disposition: A | Discharge status: Home / Self Care | Payer: Medicare Other | Source: Ambulatory Visit | Attending: Medical | Admitting: Medical

## 2015-12-24 ENCOUNTER — Telehealth: Payer: Self-pay | Admitting: Medical

## 2015-12-24 ENCOUNTER — Ambulatory Visit (INDEPENDENT_AMBULATORY_CARE_PROVIDER_SITE_OTHER): Payer: Medicare Other | Admitting: Medical

## 2015-12-24 VITALS — BP 130/70 | HR 99 | Temp 100.2°F | Ht 70.0 in

## 2015-12-24 DIAGNOSIS — R972 Elevated prostate specific antigen [PSA]: Secondary | ICD-10-CM | POA: Diagnosis not present

## 2015-12-24 DIAGNOSIS — R05 Cough: Secondary | ICD-10-CM

## 2015-12-24 DIAGNOSIS — R269 Unspecified abnormalities of gait and mobility: Secondary | ICD-10-CM

## 2015-12-24 DIAGNOSIS — R059 Cough, unspecified: Secondary | ICD-10-CM

## 2015-12-24 DIAGNOSIS — R509 Fever, unspecified: Secondary | ICD-10-CM | POA: Insufficient documentation

## 2015-12-24 DIAGNOSIS — F0280 Dementia in other diseases classified elsewhere without behavioral disturbance: Secondary | ICD-10-CM

## 2015-12-24 DIAGNOSIS — R5383 Other fatigue: Secondary | ICD-10-CM | POA: Diagnosis not present

## 2015-12-24 DIAGNOSIS — G3183 Dementia with Lewy bodies: Principal | ICD-10-CM

## 2015-12-24 LAB — COMPREHENSIVE METABOLIC PANEL
ALT: 20 U/L (ref 0–53)
AST: 25 U/L (ref 0–37)
Albumin: 4.1 g/dL (ref 3.5–5.2)
Alkaline Phosphatase: 65 U/L (ref 39–117)
BUN: 12 mg/dL (ref 6–23)
CALCIUM: 9 mg/dL (ref 8.4–10.5)
CHLORIDE: 100 meq/L (ref 96–112)
CO2: 26 meq/L (ref 19–32)
CREATININE: 1.16 mg/dL (ref 0.40–1.50)
GFR: 64.86 mL/min (ref >60.00)
Glucose, Bld: 119 mg/dL — ABNORMAL HIGH (ref 70–99)
Potassium: 4.1 mEq/L (ref 3.5–5.1)
SODIUM: 134 meq/L — AB (ref 135–145)
Total Bilirubin: 0.5 mg/dL (ref 0.2–1.2)
Total Protein: 7.2 g/dL (ref 6.0–8.3)

## 2015-12-24 LAB — CBC WITH DIFFERENTIAL/PLATELET
BASOS ABS: 0 10*3/uL (ref 0.0–0.1)
Basophils Relative: 0.3 % (ref 0.0–3.0)
EOS ABS: 0 10*3/uL (ref 0.0–0.7)
EOS PCT: 0.6 % (ref 0.0–5.0)
HCT: 41.3 % (ref 39.0–52.0)
HEMOGLOBIN: 14.4 g/dL (ref 13.0–17.0)
LYMPHS ABS: 0.7 10*3/uL (ref 0.7–4.0)
Lymphocytes Relative: 10.7 % — ABNORMAL LOW (ref 12.0–46.0)
MCHC: 34.7 g/dL (ref 30.0–36.0)
MCV: 91.4 fl (ref 78.0–100.0)
MONO ABS: 0.6 10*3/uL (ref 0.1–1.0)
Monocytes Relative: 9.5 % (ref 3.0–12.0)
NEUTROS PCT: 78.9 % — AB (ref 43.0–77.0)
Neutro Abs: 5.1 10*3/uL (ref 1.4–7.7)
Platelets: 143 10*3/uL — ABNORMAL LOW (ref 150.0–400.0)
RBC: 4.52 Mil/uL (ref 4.22–5.81)
RDW: 13 % (ref 11.5–15.5)
WBC: 6.5 10*3/uL (ref 4.0–10.5)

## 2015-12-24 LAB — POCT URINALYSIS DIPSTICK
Bilirubin, UA: NEGATIVE
Glucose, UA: NEGATIVE
KETONES UA: NEGATIVE
LEUKOCYTES UA: NEGATIVE
Nitrite, UA: NEGATIVE
PROTEIN UA: NEGATIVE
Spec Grav, UA: 1.015
Urobilinogen, UA: 4
pH, UA: 6

## 2015-12-24 LAB — PSA: PSA: 6.49 ng/mL — ABNORMAL HIGH (ref 0.10–4.00)

## 2015-12-24 MED ORDER — AZITHROMYCIN 250 MG PO TABS: ORAL_TABLET | ORAL | Status: DC

## 2015-12-24 MED ORDER — BENZONATATE 100 MG PO CAPS
100.0000 mg | ORAL_CAPSULE | Freq: Three times a day (TID) | ORAL | Status: DC | PRN
Start: 2015-12-24 — End: 2016-01-14

## 2015-12-24 MED FILL — AZITHROMYCIN 250 MG TABLET: 250 | 5 days supply | Qty: 6 | Fill #0

## 2015-12-24 MED FILL — BENZONATATE 100 MG CAPSULE: 100 | 7 days supply | Qty: 21 | Fill #0

## 2015-12-24 NOTE — Telephone Encounter (Signed)
I called pt and let him know results of cxr but also sent to staff to call his wife since pt has dementia.

## 2015-12-24 NOTE — Telephone Encounter (Signed)
Spoke w/ pt's wife and made her aware that CXR showed no active disease. She verbalized understanding.

## 2015-12-24 NOTE — Telephone Encounter (Signed)
Caller name: Carlean Purl with [facility] Can be reached: [phone]  Reason for call: Pt family requested [facility] orders/nursing and therapy. She is contacting us to see if we feel it would be beneficial.

## 2015-12-24 NOTE — Telephone Encounter (Signed)
Mackie Pai spoke with patients daughter. Gave her results of CXR.

## 2015-12-24 NOTE — Telephone Encounter (Signed)
I talked with pt daughter. Send psa result to Tresa Endo urologist at [facility] Most recent psa result that was above 6.

## 2015-12-24 NOTE — Patient Instructions (Addendum)
For your cough and fever, will get cbc and cxr. Will rx antibiotic but first want to get results of cbc and cxr.  For cough rx benzonatate  For fatigue will add cmp to blood work.  Will do urine studies and psa to evaluate different causes of recent symptoms since so early in presentation.  Follow up 7-10 days or as needed.  If condition worsens/changes severely then ED evaluation.

## 2015-12-24 NOTE — Telephone Encounter (Signed)
I called Ian Gibbs with [facility]. I plan to put in order for Home health nursing /medcication management. It took quite some time to do this in epic. Would you let me know if I was successful. If I was successful then I will try to do for PT. Don't want to spend all that time if did not go through

## 2015-12-24 NOTE — Progress Notes (Signed)
Pre visit review using our clinic review tool, if applicable. No additional management support is needed unless otherwise documented below in the visit note. 

## 2015-12-24 NOTE — Telephone Encounter (Signed)
rx azithromycin sent to pharmacy

## 2015-12-24 NOTE — Progress Notes (Signed)
Subjective:    Patient ID: Ian Gibbs, male    DOB: 25-Jun-1941, 75 y.o.   MRN: [id number]  HPI   Pt in with fever this am. Old style themometer was 102. Currently 100.2. Pt is coughing a lot past 2 days. Some productive cough. No sweating.  No st, no ear pain. No sinus pain.   No abdomen. No diarrhea.   No frequent urination. But elevated psa history. Wife described some disorientation this morning and he seems fatigued. But interacts well during interview. Pt has baseline dementia.   Review of Systems  Constitutional: Negative for fever, chills and fatigue.  HENT: Negative for congestion, ear pain, postnasal drip and rhinorrhea.   Respiratory: Positive for cough. Negative for chest tightness, shortness of breath and wheezing.   Cardiovascular: Negative for chest pain and palpitations.  Gastrointestinal: Negative for abdominal pain.  Neurological: Negative for dizziness, syncope, speech difficulty, weakness, numbness and headaches.  Hematological: Negative for adenopathy. Does not bruise/bleed easily.  Psychiatric/Behavioral: Negative for behavioral problems and confusion.   Past Medical History  Diagnosis Date  . Hyperlipemia   . Thyroid disease   . Neuropathy ([place])   . Dizziness   . Weakness   . Ptosis 2013  . Neuromuscular disorder ([place])     alzeimer, and parkinson per wife.  . Sleep apnea   . GERD (gastroesophageal reflux disease)     Social History   Social History  . Marital Status: Married    Spouse Name: [name]  . Number of Children: 1  . Years of Education: masters   Occupational History  .      Retired - Theme park manager   Social History Main Topics  . Smoking status: Never Smoker   . Smokeless tobacco: Never Used  . Alcohol Use: No  . Drug Use: No  . Sexual Activity: Not on file   Other Topics Concern  . 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 Tonsillectomy and adenoidectomy    . Cyst removal hand    . Throat surgery    . Larynx surgery    . Total knee arthroplasty      Right    Family History  Problem Relation Age of Onset  . Seizures Mother   . Cancer - Other Father   . Cancer - Other Maternal Grandmother     No Known Allergies  Current Outpatient Prescriptions on File Prior to Visit  Medication Sig Dispense Refill  . gabapentin (NEURONTIN) 300 MG capsule Take 300 mg by mouth 3 (three) times daily.     Marland Kitchen levothyroxine (SYNTHROID, LEVOTHROID) 75 MCG tablet Take 75 mcg by mouth daily before breakfast.    . memantine (NAMENDA) 10 MG tablet Take 1 tablet (10 mg total) by mouth 2 (two) times daily. 60 tablet 11  . Multiple Vitamins-Minerals (MULTIVITAMIN PO) Take by mouth daily.    Marland Kitchen OVER THE COUNTER MEDICATION Taking Persavision tablets once daily.    . simvastatin (ZOCOR) 20 MG tablet Take 10 mg by mouth every evening.     . carbidopa-levodopa (SINEMET IR) 25-100 MG tablet TAKE ONE TABLET BY MOUTH THREE TIMES DAILY 90 tablet 1   No current facility-administered medications on file prior to  visit.    BP 130/70 mmHg  Pulse 99  Temp(Src) 100.2 F (37.9 C) (Oral)  Ht 5\' 10"  (1.778 m)  Wt   SpO2 97%        Objective:   Physical Exam  General  Mental Status - Alert. General Appearance - Well groomed. Not in acute distress.  Skin Rashes- No Rashes.  HEENT Head- Normal. Ear Auditory Canal - Left- Normal. Right - Normal.Tympanic Membrane- Left- Normal. Right- Normal. Eye Sclera/Conjunctiva- Left- Normal. Right- Normal. Nose & Sinuses Nasal Mucosa- Left-  not Boggy or Congested. Right-   Not Boggy or Congested.Bilateral no maxillary and no  frontal sinus pressure. Mouth & Throat Lips: Upper Lip- Normal: no dryness, cracking, pallor, cyanosis, or vesicular eruption. Lower Lip-Normal: no dryness,  cracking, pallor, cyanosis or vesicular eruption. Buccal Mucosa- Bilateral- No Aphthous ulcers. Oropharynx- No Discharge or Erythema. Tonsils: Characteristics- Bilateral- No Erythema or Congestion. Size/Enlargement- Bilateral- No enlargement. Discharge- bilateral-None.  Neck Neck- Supple. No Masses.   Chest and Lung Exam Auscultation: Breath Sounds:-Clear even and unlabored. but shallow respiration.  Cardiovascular Auscultation:Rythm- Regular, rate and rhythm. Murmurs & Other Heart Sounds:Ausculatation of the heart reveal- No Murmurs.  Lymphatic Head & Neck General Head & Neck Lymphatics: Bilateral: Description- No Localized lymphadenopathy.   Abdomen Inspection:-Inspection Normal.  Palpation/Perucssion: Palpation and Percussion of the abdomen reveal- Non Tender, No Rebound tenderness, No rigidity(Guarding) and No Palpable abdominal masses.  Liver:-Normal.  Spleen:- Normal.   Back- no cva tenderness        Assessment & Plan:  For your cough and fever, will get cbc and cxr. Will rx antibiotic but first want to get results of cbc and cxr.  For cough rx benzonatate  For fatigue will add cmp to blood work.  Will do urine studies and psa to evaluate different causes of recent symptoms since so early in presentation.  Follow up 7-10 days or as needed.  If condition worsens/changes severely then ED evaluation.

## 2015-12-24 NOTE — Telephone Encounter (Signed)
Printed PSA results done on 12/24/2015. Call [facility] @ [phone] to get fax number for Dr. Tresa Endo, III (Urologist). Faxed results to [phone] @ 4:15 PM 12/24/2015. Received confirmation that fax had been submitted successfully.

## 2015-12-25 ENCOUNTER — Ambulatory Visit: Payer: Medicare Other

## 2015-12-25 NOTE — Telephone Encounter (Signed)
Yes referral is placed correctly, order has been sent to [facility]

## 2015-12-25 NOTE — Telephone Encounter (Signed)
Spoke with Ian Gibbs and she states that wanted to touch base about [name] and see if we would need to continue with home health. Per PCP he would like to go ahead with just having a nurse come out and check on pt. In the future PCP would like to consider PT. Per Ian Gibbs pcp needs to put an order for home health nursing and medication management in the system. Referral placed in system and [facility] faxed over information.

## 2015-12-25 NOTE — Telephone Encounter (Signed)
Actually I went ahead and made refer for home health.

## 2015-12-25 NOTE — Telephone Encounter (Signed)
I put in order for PT. Will you check and see if it went through.

## 2015-12-26 LAB — URINE CULTURE
COLONY COUNT: NO GROWTH
Organism ID, Bacteria: NO GROWTH

## 2015-12-26 NOTE — Telephone Encounter (Signed)
Yes order is in and I have sent to [facility]

## 2015-12-29 DIAGNOSIS — G3183 Dementia with Lewy bodies: Secondary | ICD-10-CM | POA: Diagnosis not present

## 2015-12-29 DIAGNOSIS — F0280 Dementia in other diseases classified elsewhere without behavioral disturbance: Secondary | ICD-10-CM | POA: Diagnosis not present

## 2016-01-02 ENCOUNTER — Telehealth: Payer: Self-pay | Admitting: Medical

## 2016-01-02 NOTE — Telephone Encounter (Signed)
Caller name:RENEE Relationship to patient:BAYADA Can be reached:[phone] Pharmacy:  Reason for cal  SHE HAS AN ORDER FOR OCCAPATIONAL THERAPY FOR TODAY  THE PATIENT WANTS IT DONE ON Monday

## 2016-01-02 NOTE — Telephone Encounter (Signed)
Called to follow up.  Left a message for call back.   

## 2016-01-05 DIAGNOSIS — F0280 Dementia in other diseases classified elsewhere without behavioral disturbance: Secondary | ICD-10-CM | POA: Diagnosis not present

## 2016-01-05 DIAGNOSIS — G3183 Dementia with Lewy bodies: Secondary | ICD-10-CM | POA: Diagnosis not present

## 2016-01-05 MED FILL — SYNTHROID 75 MCG TABLET: 75 | 30 days supply | Qty: 30 | Fill #0

## 2016-01-06 ENCOUNTER — Telehealth: Payer: Self-pay | Admitting: Neurology

## 2016-01-06 DIAGNOSIS — F0280 Dementia in other diseases classified elsewhere without behavioral disturbance: Secondary | ICD-10-CM | POA: Diagnosis not present

## 2016-01-06 DIAGNOSIS — G3183 Dementia with Lewy bodies: Secondary | ICD-10-CM | POA: Diagnosis not present

## 2016-01-06 NOTE — Telephone Encounter (Signed)
I have discussed with home health nurse April, patient complains of increased dizziness, positional related, worsened after Namenda started in February 2017, he has been taking Sinemet 25/100 mg 3 times a day since April 2016,  I have asked April to advise patient to stop Namenda, continue physical therapy, document orthostatic blood pressures,  Will consider stop sentiment at his next follow-up visit in August 2016

## 2016-01-06 NOTE — Telephone Encounter (Signed)
April with [facility] called and went to see him yesterday afternoon. Pt is complaining of dizziness off and on for a little while. Pt is currently taking memantine (NAMENDA) 10 MG tablet and carbidopa-levodopa (SINEMET IR) 25-100 MG tablet. April said she knows that a side effect is dizziness. She asked him to rate his dizziness as a 4 out of 10. However, it is progressively getting worse. Pt's wife stated she has noticed a positive change since taking the Namenda. April is requesting a call back [phone].

## 2016-01-08 ENCOUNTER — Ambulatory Visit: Payer: Medicare Other | Admitting: Medical

## 2016-01-08 DIAGNOSIS — F0280 Dementia in other diseases classified elsewhere without behavioral disturbance: Secondary | ICD-10-CM | POA: Diagnosis not present

## 2016-01-08 DIAGNOSIS — G3183 Dementia with Lewy bodies: Secondary | ICD-10-CM | POA: Diagnosis not present

## 2016-01-12 ENCOUNTER — Ambulatory Visit: Payer: Medicare Other | Admitting: Medical

## 2016-01-13 DIAGNOSIS — F0280 Dementia in other diseases classified elsewhere without behavioral disturbance: Secondary | ICD-10-CM | POA: Diagnosis not present

## 2016-01-13 DIAGNOSIS — G3183 Dementia with Lewy bodies: Secondary | ICD-10-CM | POA: Diagnosis not present

## 2016-01-14 ENCOUNTER — Ambulatory Visit (INDEPENDENT_AMBULATORY_CARE_PROVIDER_SITE_OTHER): Payer: Medicare Other | Admitting: Medical

## 2016-01-14 ENCOUNTER — Telehealth: Payer: Self-pay | Admitting: *Deleted

## 2016-01-14 ENCOUNTER — Encounter: Payer: Self-pay | Admitting: Medical

## 2016-01-14 VITALS — BP 124/70 | HR 77 | Temp 98.0°F | Ht 70.0 in | Wt 177.4 lb

## 2016-01-14 DIAGNOSIS — R972 Elevated prostate specific antigen [PSA]: Secondary | ICD-10-CM | POA: Diagnosis not present

## 2016-01-14 DIAGNOSIS — K219 Gastro-esophageal reflux disease without esophagitis: Secondary | ICD-10-CM

## 2016-01-14 DIAGNOSIS — R1110 Vomiting, unspecified: Secondary | ICD-10-CM | POA: Diagnosis not present

## 2016-01-14 DIAGNOSIS — F0280 Dementia in other diseases classified elsewhere without behavioral disturbance: Secondary | ICD-10-CM

## 2016-01-14 DIAGNOSIS — E871 Hypo-osmolality and hyponatremia: Secondary | ICD-10-CM

## 2016-01-14 DIAGNOSIS — G300 Alzheimer's disease with early onset: Secondary | ICD-10-CM

## 2016-01-14 DIAGNOSIS — D696 Thrombocytopenia, unspecified: Secondary | ICD-10-CM

## 2016-01-14 MED ORDER — OMEPRAZOLE 20 MG PO CPDR: 20.0000 mg | DELAYED_RELEASE_CAPSULE | Freq: Every day | ORAL | Status: DC

## 2016-01-14 MED FILL — OMEPRAZOLE DR 20 MG CAPSULE: 20 | 30 days supply | Qty: 30 | Fill #0

## 2016-01-14 NOTE — Progress Notes (Signed)
Subjective:    Patient ID: Ian Gibbs, male    DOB: 11-02-1940, 75 y.o.   MRN: [id number]  HPI  Pt was in and they thought maybe this was wellness exam  but that is not the case. Scheduled only 15 minutes.  Last time I saw him he had fever. That day he slept 3 hours  and then he woke up and felt a lot better. I gave him azithromycin that day. He did take the full course of treatment. Earlier that day he was lethargic. Pt cxr looked good. He now is full recovered.  Pt in past had mild low na and mild low platlets when I saw him lst.  Pt has some elevated psa history.  Pt urologist is Dr. Rosana Gibbs.(With [facility])  Pt mental status is good today. Interacting well.  Pt had homehealth coming out. Also PT and OT since I last saw.   Also pt at times had problems swallowing. So much so that sometimes he vomits. This has been going on for years. Pt denies any heart burn. States foods feels like food flips over and then will have to spit food out. He vomit/regurgitates about twice a month.       Review of Systems  Constitutional: Negative for fever, chills and fatigue.  HENT: Negative for congestion, drooling and ear pain.   Respiratory: Negative for apnea, cough and choking.   Cardiovascular: Negative for chest pain and palpitations.  Gastrointestinal: Negative for abdominal pain.  Musculoskeletal: Negative for myalgias, back pain and gait problem.  Skin: Negative for pallor.  Neurological: Negative for dizziness and facial asymmetry.  Hematological: Negative for adenopathy. Does not bruise/bleed easily.  Psychiatric/Behavioral: Negative for hallucinations, behavioral problems and confusion. The patient is not nervous/anxious.        See hpi. Dementia appears stable.   Past Medical History  Diagnosis Date  . Hyperlipemia   . Thyroid disease   . Neuropathy ([place])   . Dizziness   . Weakness   . Ptosis 2013  . Neuromuscular disorder ([place])     alzeimer, and parkinson per wife.  .  Sleep apnea   . GERD (gastroesophageal reflux disease)     Social History   Social History  . Marital Status: Married    Spouse Name: Ian Gibbs  . Number of Children: 1  . Years of Education: masters   Occupational History  .      Retired - Theme park manager   Social History Main Topics  . Smoking status: Never Smoker   . Smokeless tobacco: Never Used  . Alcohol Use: No  . Drug Use: No  . Sexual Activity: Not on file   Other Topics Concern  . 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 Tonsillectomy and adenoidectomy    . Cyst removal hand    . Throat surgery    . Larynx surgery    . Total knee arthroplasty      Right    Family History  Problem Relation Age of Onset  . Seizures Mother   . Cancer - Other Father   . Cancer - Other Maternal Grandmother     No Known Allergies  Current Outpatient Prescriptions on File Prior to  Visit  Medication Sig Dispense Refill  . carbidopa-levodopa (SINEMET IR) 25-100 MG tablet TAKE ONE TABLET BY MOUTH THREE TIMES DAILY 90 tablet 1  . gabapentin (NEURONTIN) 300 MG capsule Take 300 mg by mouth 3 (three) times daily.     Marland Kitchen levothyroxine (SYNTHROID, LEVOTHROID) 75 MCG tablet Take 75 mcg by mouth daily before breakfast.    . Multiple Vitamins-Minerals (MULTIVITAMIN PO) Take by mouth daily.    Marland Kitchen OVER THE COUNTER MEDICATION Taking Persavision tablets once daily.    . simvastatin (ZOCOR) 20 MG tablet Take 10 mg by mouth every evening.      No current facility-administered medications on file prior to visit.    BP 124/70 mmHg  Pulse 77  Temp(Src) 98 F (36.7 C) (Oral)  Ht _0  (1.778 m)  Wt 177 lb 6.4 oz (80.468 kg)  BMI 25.45 kg/m2  SpO2 97%       Objective:   Physical Exam   General Mental Status- Alert. General Appearance- Not in  acute distress. Pleasant today. Interacts well.  Skin General: Color- Normal Color. Moisture- Normal Moisture.  Neck Carotid Arteries- Normal color. Moisture- Normal Moisture. No carotid bruits. No JVD.  Chest and Lung Exam Auscultation: Breath Sounds:-Normal.  Cardiovascular Auscultation:Rythm- Regular. Murmurs & Other Heart Sounds:Auscultation of the heart reveals- No Murmurs.  Abdomen Inspection:-Inspeection Normal. Palpation/Percussion:Note:No mass. Palpation and Percussion of the abdomen reveal- Non Tender, Non Distended + BS, no rebound or guarding.    Neurologic Cranial Nerve exam:- CN III-XII intact(No nystagmus), symmetric smile. Strength:- 5/5 equal and symmetric strength both upper and lower extremities.     Assessment & Plan:  Pt pleasant today and by description dementia appears stable. Contine sinemet and namenda.   Pt overall energy much improved.He may have had early pneumonia and I treated with azithromycin.   Some recent GI symptoms possible gerd. Will rx omemprazole. See if occasional regurgitation resolved. If not then will see GI. Will go ahead and make GI appointment. Make for 4-6 wks out.  Will get labs today. Assess na, platelets, and pancrease enzymes.  Repeat psa and send results to former urologist.  Follow up in 1 mont or as needed

## 2016-01-14 NOTE — Progress Notes (Signed)
Pre visit review using our clinic review tool, if applicable. No additional management support is needed unless otherwise documented below in the visit note. 

## 2016-01-14 NOTE — Telephone Encounter (Signed)
Received Home Health Certification and Plan of Care; forwarded to provider/SLS 04/05

## 2016-01-14 NOTE — Patient Instructions (Addendum)
Pt pleasant today and by description dementia appears stable. Contine sinemet and namenda.  Pt overall energy much  Improved.He may have had early pneumonia and I treated with azithromycin.   Some recent GI symptoms possible gerd. Will rx omemprazole. See if occasional regurgitation resolved. If not then will see GI. Will go ahead and make GI appointment. Make for 4-6 wks out.  Will get labs today. Assess na, platelets, and pancrease enzymes.  Repeat psa and send results to former urologist.  Follow up in 1 month or as needed

## 2016-01-15 DIAGNOSIS — G3183 Dementia with Lewy bodies: Secondary | ICD-10-CM | POA: Diagnosis not present

## 2016-01-15 DIAGNOSIS — F0280 Dementia in other diseases classified elsewhere without behavioral disturbance: Secondary | ICD-10-CM | POA: Diagnosis not present

## 2016-01-15 LAB — CBC WITH DIFFERENTIAL/PLATELET
Basophils Absolute: 0.1 10*3/uL (ref 0.0–0.1)
Basophils Relative: 0.8 % (ref 0.0–3.0)
Eosinophils Absolute: 0.2 10*3/uL (ref 0.0–0.7)
Eosinophils Relative: 2.4 % (ref 0.0–5.0)
HCT: 40 % (ref 39.0–52.0)
Hemoglobin: 13.9 g/dL (ref 13.0–17.0)
Lymphocytes Relative: 29.7 % (ref 12.0–46.0)
Lymphs Abs: 1.9 10*3/uL (ref 0.7–4.0)
MCHC: 34.7 g/dL (ref 30.0–36.0)
MCV: 92.5 fl (ref 78.0–100.0)
Monocytes Absolute: 0.7 10*3/uL (ref 0.1–1.0)
Monocytes Relative: 10.5 % (ref 3.0–12.0)
Neutro Abs: 3.7 10*3/uL (ref 1.4–7.7)
Neutrophils Relative %: 56.6 % (ref 43.0–77.0)
Platelets: 197 10*3/uL (ref 150.0–400.0)
RBC: 4.32 Mil/uL (ref 4.22–5.81)
RDW: 13.8 % (ref 11.5–15.5)
WBC: 6.5 10*3/uL (ref 4.0–10.5)

## 2016-01-15 LAB — COMPREHENSIVE METABOLIC PANEL
ALK PHOS: 64 U/L (ref 39–117)
ALT: 15 U/L (ref 0–53)
AST: 21 U/L (ref 0–37)
Albumin: 4 g/dL (ref 3.5–5.2)
BILIRUBIN TOTAL: 0.6 mg/dL (ref 0.2–1.2)
BUN: 16 mg/dL (ref 6–23)
CALCIUM: 9.6 mg/dL (ref 8.4–10.5)
CO2: 31 mEq/L (ref 19–32)
Chloride: 103 mEq/L (ref 96–112)
Creatinine, Ser: 1.1 mg/dL (ref 0.40–1.50)
GFR: 68.95 mL/min (ref >60.00)
Glucose, Bld: 105 mg/dL — ABNORMAL HIGH (ref 70–99)
POTASSIUM: 4 meq/L (ref 3.5–5.1)
Sodium: 140 mEq/L (ref 135–145)
TOTAL PROTEIN: 7.4 g/dL (ref 6.0–8.3)

## 2016-01-15 LAB — PSA: PSA: 7.63 ng/mL — AB (ref 0.10–4.00)

## 2016-01-15 LAB — LIPASE: LIPASE: 4 U/L — AB (ref 11.0–59.0)

## 2016-01-15 LAB — AMYLASE: Amylase: 27 U/L (ref 27–131)

## 2016-01-15 NOTE — Telephone Encounter (Signed)
April/[facility] [phone] called to advise, Orthostatics have been negative, however patient still complains of dizziness. Patient states, "it's a little improved". Memory wise, still about the same, no changes.

## 2016-01-15 NOTE — Telephone Encounter (Signed)
Spoke to Dr. Krista Blue and she would like to see him in the office.  Called April and an appt has been scheduled for him.

## 2016-01-15 NOTE — Telephone Encounter (Signed)
Pt and wife can't remember urologist number. But Dr. Rosana Hoes with [facility]

## 2016-01-16 DIAGNOSIS — F0280 Dementia in other diseases classified elsewhere without behavioral disturbance: Secondary | ICD-10-CM | POA: Diagnosis not present

## 2016-01-16 DIAGNOSIS — G3183 Dementia with Lewy bodies: Secondary | ICD-10-CM | POA: Diagnosis not present

## 2016-01-19 ENCOUNTER — Other Ambulatory Visit: Payer: Self-pay

## 2016-01-19 DIAGNOSIS — G3183 Dementia with Lewy bodies: Secondary | ICD-10-CM | POA: Diagnosis not present

## 2016-01-19 DIAGNOSIS — F0280 Dementia in other diseases classified elsewhere without behavioral disturbance: Secondary | ICD-10-CM | POA: Diagnosis not present

## 2016-01-19 DIAGNOSIS — H6122 Impacted cerumen, left ear: Secondary | ICD-10-CM | POA: Diagnosis not present

## 2016-01-19 DIAGNOSIS — H903 Sensorineural hearing loss, bilateral: Secondary | ICD-10-CM | POA: Diagnosis not present

## 2016-01-19 MED ORDER — SIMVASTATIN 20 MG PO TABS: 10.0000 mg | ORAL_TABLET | Freq: Every evening | ORAL | Status: DC

## 2016-01-19 MED ORDER — GABAPENTIN 300 MG PO CAPS: 300.0000 mg | ORAL_CAPSULE | Freq: Three times a day (TID) | ORAL | Status: DC

## 2016-01-19 MED ORDER — CARBIDOPA-LEVODOPA 25-100 MG PO TABS: 1.0000 | ORAL_TABLET | Freq: Three times a day (TID) | ORAL | Status: DC

## 2016-01-19 MED FILL — GABAPENTIN 300 MG CAPSULE: 300 | 60 days supply | Qty: 180 | Fill #0

## 2016-01-19 MED FILL — SIMVASTATIN 20 MG TABLET: 20 | 60 days supply | Qty: 30 | Fill #0 | Status: TO

## 2016-01-20 MED FILL — CARBIDOPA-LEVODOPA 25-100 T: 25-100 | 30 days supply | Qty: 90 | Fill #0

## 2016-01-21 DIAGNOSIS — G3183 Dementia with Lewy bodies: Secondary | ICD-10-CM | POA: Diagnosis not present

## 2016-01-21 DIAGNOSIS — F0280 Dementia in other diseases classified elsewhere without behavioral disturbance: Secondary | ICD-10-CM | POA: Diagnosis not present

## 2016-01-22 DIAGNOSIS — G3183 Dementia with Lewy bodies: Secondary | ICD-10-CM | POA: Diagnosis not present

## 2016-01-22 DIAGNOSIS — F0280 Dementia in other diseases classified elsewhere without behavioral disturbance: Secondary | ICD-10-CM | POA: Diagnosis not present

## 2016-01-27 ENCOUNTER — Telehealth: Payer: Self-pay | Admitting: *Deleted

## 2016-01-27 ENCOUNTER — Ambulatory Visit: Payer: Self-pay | Admitting: Neurology

## 2016-01-27 DIAGNOSIS — G3183 Dementia with Lewy bodies: Secondary | ICD-10-CM | POA: Diagnosis not present

## 2016-01-27 DIAGNOSIS — F0280 Dementia in other diseases classified elsewhere without behavioral disturbance: Secondary | ICD-10-CM | POA: Diagnosis not present

## 2016-01-27 NOTE — Telephone Encounter (Signed)
No show - called after his appt time - stated they had his appts mixed up.

## 2016-01-28 ENCOUNTER — Encounter: Payer: Self-pay | Admitting: Neurology

## 2016-01-29 ENCOUNTER — Encounter: Payer: Self-pay | Admitting: Neurology

## 2016-01-29 ENCOUNTER — Ambulatory Visit (INDEPENDENT_AMBULATORY_CARE_PROVIDER_SITE_OTHER): Payer: Medicare Other | Admitting: Neurology

## 2016-01-29 VITALS — BP 129/82 | HR 82 | Ht 70.0 in | Wt 177.0 lb

## 2016-01-29 DIAGNOSIS — G629 Polyneuropathy, unspecified: Secondary | ICD-10-CM | POA: Diagnosis not present

## 2016-01-29 DIAGNOSIS — G3184 Mild cognitive impairment, so stated: Secondary | ICD-10-CM

## 2016-01-29 DIAGNOSIS — G4733 Obstructive sleep apnea (adult) (pediatric): Secondary | ICD-10-CM

## 2016-01-29 DIAGNOSIS — H532 Diplopia: Secondary | ICD-10-CM

## 2016-01-29 DIAGNOSIS — R269 Unspecified abnormalities of gait and mobility: Secondary | ICD-10-CM

## 2016-01-29 DIAGNOSIS — I951 Orthostatic hypotension: Secondary | ICD-10-CM | POA: Insufficient documentation

## 2016-01-29 MED FILL — SYNTHROID 75 MCG TABLET: 75 | 30 days supply | Qty: 30 | Fill #0

## 2016-01-29 NOTE — Telephone Encounter (Signed)
Home health cert & POC faxed successfully, sent for scanning. JG//CMA

## 2016-01-29 NOTE — Patient Instructions (Addendum)
Stop Sinemet (carbidopa/levodopa)

## 2016-01-29 NOTE — Progress Notes (Signed)
Chief Complaint  Patient presents with  . Memory Loss    MMSE 28/30 - 13 animals.  He is here with his wife, Ian Gibbs.  Says his memory is unchanged.  He stopped Namenda and feels his double vision has improved since discontinuing this medication.  . Dizziness    Orthostatics: Lying: 129/82, 82, Sitting: 108/70, 85, Standing: 99/67, 88.  Reports dizziness is still problematic and tends to worsen with positional changes.  . Sleep Apnea    Says he is only using his CPAP machine intermittently.  Says this is mostly due to forgetfulness.  . Gait Problem    At times, he still feels unsteady.  He is now using a cane for ambulation.  He has one more week of home physical therapy.     [facility]  PATIENT: Ian Gibbs DOB: [birth year]   REASON FOR VISIT: Follow-up for diplopia dizziness, history of obstructive sleep apnea   HISTORY OF PRESENT ILLNESS: Ian Gibbs 75 yo RH WM with wife, is referred by his primary care phy sician Dr. Morrie Gibbs for evaluation of peripheral neuropathy, ptosis, blurry vision, dizziness, generalized weakness, Initial visit, March 2015.  He had past medical history of hypothyroidism, hyperlipidemia [place], he developed gradual onset difficulty talking, his voice varies from high pitch to low, he also has mild dysphagia, [place], was treated at the [facility] by ENT, was diagnosed with bilateral vocal cord paralysis of unknown etiology, had surgery, which helped his symptoms some In 2012, he noticed bilateral feet discomfort, numbness tingling, burning discomfort, getting worse after bearing weight, gradually getting worse, he was evaluated by [facility]ist Dr. Ellender Gibbs, reported abnormal EMG nerve conduction study consistent with peripheral neuropathy, notes also reported extensive laboratory evaluation, including vitamin B1, IFEP was normal, Over the years, his symptoms  has been controlled with titrating dose of gabapentin, currently he is taking 600 mg 3 times a day.  In 2013, he also developed generalized weakness, intermittent ptosis, occasionally gait difficulty, getting worse over past 1 year, over past 6 months, he noticed worsening ptosis, also intermittent double vision, especially with prolonged reading, speech is slurred, mild swallowing difficulties, gait difficulty, he also complains of dizziness, lightheadedness after prolonged standing,  MRI of the brain March 2015, showed moderate perisylvian fissure atrophy, mild to moderate small vessel disease, no acute lesions.  Laboratory showed normal or Negative RPR, B12, C. reactive protein, folic acid, TSH, ESR, ANA, acetylcholine receptor antibody, EMG nerve conduction study March 2015,  showed length dependent mild axonal peripheral neuropathy, repetitive stimulation of right accessory nerve, recording at right upper trapezius showed no abnormalities. He continue complains of fatigue, lack of stamina, with prolonged walking, he complains of low back pain, bilateral calf muscle tightness, achiness, he is taking Mestinon 60 mg 3 times a day, tolerating it well, but there was no significant improvement MRI of the lumbar, only mild degenerative disc disease, there is no significant canal, of foraminal stenosis, Athena diagnostic testing showed negative musk antibody, borderline antibody to acetylcholine receptor. CT of the chest showed mild cardiomegaly, no acute lesions, no thymus pathology noticed. He also complains of daytime sleepiness, snoring, ESS score is 12, he tends to sleep while sitting down reading, watching TV, even sitting inactive in public places, lying down rest in the afternoon, after lunch, FSS score is 47, he also complains of dry mouth, frequent awakening at night time, he had sleep study, was put  on CPAP machine,  UPDATE Jul 01 2014: He continues to get weaker, he complains of dizziness when  getting up, lightheaded sensation, no spinning around, he has unsteady gait, rely on his cane, mild short-term memory trouble. His maternal grandmother, mother had Alzheimer's disease. I have reviewed the [facility] neuromuscular consultation in August 2015, and single-fiber EMG by Dr. Vallarie Gibbs, the conclusion was normal study. The main jitter was 33.51m, less than the normal range of 41, only one pair was abnormal at 106, and that particular repair showed blocking, 2 other pairs approached the upper limits of normal, and did not show blocking.  UPDATE March 22nd 2016: Right knee replacement in Oct 16th 2015, he recovered very well, no significant right knee pain, but he continue have gait difficulty, fell twice since, in addition, since January 2016, he developed worsening bowel and bladder incontinence,   Last visit was with Ian Gibbs November 2015, he was diagnosed with obstructive sleep apnea and uses CPAP. Using CPAP machine, overall much improved quality of sleep,  He complains of dizziness, fussy, at different times, mostly happened when he stands, blurry vision, lasting for a few minutes, worsening memory trouble, today's Mini-Mental status examination is 28 out of 30, animal naming is 160 UPDATE April 28th 2016: He complains of feeling all the time, increased balance difficulty, rely on his cane, especially in past 2 weeks,he has no incontinence, he also complains of numbness at his feet, lasting for few minutes, vision is blurred, difficulty to read, even with new glasses prescirptions, he has lost sense of smell, he now sleeps well with CPAP  We have reviewed MRI of cervical spine, multilevel degenerative changes, foraminal stenosis at different levels, but no significant canal stenosis  UPDATE May 15 2015: He is with his wife, complains of dizziness when standing up quickly, there was no orthostatic blood pressure changes today, On further questioning, his complains of dizziness  is unsteady gait, he has gradually declined functional status, has become more sedentary, unsure on his feet He continue blurry vision, could no longer read much   UPDATE Feb 9th 2017: He continues to decline slowly, he has more slow walking, mildly increased gait difficulty, he denies significant pain, he denies incontinence, does has urinary urgency, he has no dysphagia, no chewing difficulty,  No limb wekaness, he sleeps well now with CPAP machine,  He has mild memory trouble, he enjoys reading, but has trouble keeping up sometimes, got confused about his driving direction occasionally   He has strong family history of dementia, his maternal grandmother, mother also suffered Alzheimer's disease.  UPDATE January 29 2016: Since he stopped Namenda in March 2017, his dizziness has improved, he still has positional related, dizziness, lightheadness when standing up quickly or with prolonged, walking.  He is taking Sinemet 25/100 tid, not sure about the benefit,   Wife reported one episode of fever, not feeling well, and then transient loss of consciousness, went limb, staring into the space lasting for a few minutes in March 2017, per wife, the blood pressure was elevated 150 during event,  The most bothersome symptoms for patient is intermittent dizziness especially with standing up quickly or with prolonged walking, continued mild worsening gait difficulty, bilateral feet paresthesia   REVIEW OF SYSTEMS: Full 14 system review of systems performed and notable only for those listed, all others are neg: ringing ears, fatigue, activity change, double vision, blurred vision, apnea, daytime sleepiness, frequent urination, walking difficulty, memory loss, dizziness, headaches, numbness, speech difficulty  weakness, facial drooping, confusion, decreased concentration   ALLERGIES: No Known Allergies  HOME MEDICATIONS: Outpatient Prescriptions Prior to Visit  Medication Sig Dispense Refill  .  carbidopa-levodopa (SINEMET IR) 25-100 MG tablet Take 1 tablet by mouth 3 (three) times daily. 90 tablet 1  . gabapentin (NEURONTIN) 300 MG capsule Take 1 capsule (300 mg total) by mouth 3 (three) times daily. 180 capsule 1  . levothyroxine (SYNTHROID, LEVOTHROID) 75 MCG tablet Take 75 mcg by mouth daily before breakfast.    . Multiple Vitamins-Minerals (MULTIVITAMIN PO) Take by mouth daily.    Marland Kitchen omeprazole (PRILOSEC) 20 MG capsule Take 1 capsule (20 mg total) by mouth daily. 30 capsule 3  . OVER THE COUNTER MEDICATION Taking Persavision tablets once daily.    . simvastatin (ZOCOR) 20 MG tablet Take 0.5 tablets (10 mg total) by mouth every evening. 30 tablet 3   No facility-administered medications prior to visit.    PAST MEDICAL HISTORY: Past Medical History  Diagnosis Date  . Hyperlipemia   . Thyroid disease   . Neuropathy ([place])   . Dizziness   . Weakness   . Ptosis 2013  . Neuromuscular disorder ([place])     alzeimer, and parkinson per wife.  . Sleep apnea   . GERD (gastroesophageal reflux disease)     PAST SURGICAL HISTORY: Past Surgical History  Procedure Laterality Date  . Tonsillectomy and adenoidectomy    . Cyst removal hand    . Throat surgery    . Larynx surgery    . Total knee arthroplasty      Right    FAMILY HISTORY: Family History  Problem Relation Age of Onset  . Seizures Mother   . Cancer - Other Father   . Cancer - Other Maternal Grandmother     SOCIAL HISTORY: Social History   Social History  . Marital Status: Married    Spouse Name: Ian Gibbs  . Number of Children: 1  . Years of Education: masters   Occupational History  .      Retired - Theme park manager   Social History Main Topics  . Smoking status: Never Smoker   . Smokeless tobacco: Never Used  . Alcohol Use: No  . Drug Use: No  . Sexual Activity: Not on file   Other Topics Concern  . 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 PHYSICAL EXAMNIATION:  Gen: NAD, conversant, well nourised, obese, well groomed                     Cardiovascular: Regular rate rhythm, no peripheral edema, warm, nontender. Eyes: Conjunctivae clear without exudates or hemorrhage Neck: Supple, no carotid bruise. Pulmonary: Clear to auscultation bilaterally   NEUROLOGICAL EXAM:  MENTAL STATUS: Speech:    Speech is soft, fluent and spontaneous with normal comprehension.  Cognition: Mini-Mental Status Examination is 28 out of 30, animal naming is 13     Orientation to time, place and person: He is not oriented to doctor's office     Recent and remote memory, she missed  2 out of 3 recalls     Normal Attention span and concentration     Normal Language, naming, repeating,spontaneous speech     Fund of knowledge  CRANIAL NERVES: CN II: Visual fields are full to confrontation. Fundoscopic exam is normal with sharp discs and no vascular changes. Venous pulsations are present bilaterally. Pupils are 4 mm and briskly reactive to light. Visual acuity is 20/20 bilaterally. CN III, IV, VI: extraocular movement are normal. Left ptosis, CN V: Facial sensation is intact to pinprick in all 3 divisions bilaterally. Corneal responses are intact.  CN VII: Face is symmetric, mild bilateral eye-closure, cheek puff weakness, he has static left ptosis  CN VIII: Hearing is normal to rubbing fingers CN IX, X: Palate elevates symmetrically. Phonation is normal. CN XI: Head turning and shoulder shrug are intact CN XII: Tongue is midline with normal movements and no atrophy.  MOTOR: Normal muscle bulk, and strength, he has mild limb and nuchal rigidity,  REFLEXES: Reflexes are hypoactive and symmetric. Plantar  responses are flexor.  SENSORY: He has length dependent decreased light touch pinprick to above ankle level, absent vibratory sensation to ankle level.  COORDINATION: Rapid alternating movements and fine finger movements are intact. There is no dysmetria on finger-to-nose and heel-knee-shin. There are no abnormal or extraneous movements.   GAIT/STANCE: Stoop forward, bilateral valgrus knee, cautious, mildly unsteady, decreased bilateral arm swing, tends to lean his body towards the right side  DIAGNOSTIC DATA (LABS, IMAGING, TESTING) - I reviewed patient records, labs, notes, testing and imaging myself where available. MRI brain March 2015:  moderate perisylvian fissure atrophy, mild to moderate small vessel disease, no acute lesions.  MRI cervical spines April 2016: C5-6: severe biforaminal stenosis and mild spinal stenosis; no cord signal abnormalities. C4-5: moderate biforaminal stenosis. At C3-4: moderate right and mild left foraminal stenosis. At C6-7: moderate left foraminal stenosis.  MRI lumbar 2015:  L3-4: facet hypertrophy with no spinal stenosis or foraminal narrowing. L4-5: disc bulging and facet hypertrophy with no spinal stenosis or foraminal narrowing   Nerve conduction EMG in March 2015, mild length dependent axonal peripheral neuropathy  ASSESSMENT AND PLAN Mild cognitive impairment:  He has strong family history of Alzheimer's disease,  Today Mini-Mental Status Examination is 28 out of 30  he could not tolerateda 10 mg twice a day due to increased dizziness Diplopia  Extensive evaluations, no evidence of neuromuscular junctional disorder, acetylcholine receptor antibody was negative, Athena diagnostics testing showed borderline acetylcholine receptor binding antibody in the past,  Consider repeat acetylcholine receptor binding antibody  Obstructive sleep apnea  Using CPAP machine, Peripheral neuropathy   Continue gabapentin 300 mg 3 times a day,  Worsening Gait  difficulty  Multifactorial, includes peripheral neuropathy,valgrus knee, deconditioning there was no evidence of orthostatic blood pressure changes  Have encouraged him continue moderate exercise   He has mild parkinsonian features, was not sure whether Sinemet 25/100 mg 3 times a dawas helpful, it has increased his lightheadedness, will stop Sinemet Orthostatic hypotension:   this is related to his central nervous system degenerative disorder, he has mild cognitive impairment, mild parkinsonian features, strong family history of dementia,  His orthostatic blood pressure ranges is also related to his peripheral neuropathy  I have advised him keep well hydration  Keep moderate exercise   Marcial Pacas, M.D. Ph.D.  [facility] [place], [place] [address] Phone: [phone] Fax:      [phone]

## 2016-01-30 DIAGNOSIS — G3183 Dementia with Lewy bodies: Secondary | ICD-10-CM | POA: Diagnosis not present

## 2016-01-30 DIAGNOSIS — F0280 Dementia in other diseases classified elsewhere without behavioral disturbance: Secondary | ICD-10-CM | POA: Diagnosis not present

## 2016-02-04 DIAGNOSIS — G3183 Dementia with Lewy bodies: Secondary | ICD-10-CM | POA: Diagnosis not present

## 2016-02-04 DIAGNOSIS — F0280 Dementia in other diseases classified elsewhere without behavioral disturbance: Secondary | ICD-10-CM | POA: Diagnosis not present

## 2016-02-05 DIAGNOSIS — F0280 Dementia in other diseases classified elsewhere without behavioral disturbance: Secondary | ICD-10-CM | POA: Diagnosis not present

## 2016-02-05 DIAGNOSIS — G3183 Dementia with Lewy bodies: Secondary | ICD-10-CM | POA: Diagnosis not present

## 2016-02-09 DIAGNOSIS — N401 Enlarged prostate with lower urinary tract symptoms: Secondary | ICD-10-CM | POA: Diagnosis not present

## 2016-02-09 DIAGNOSIS — N529 Male erectile dysfunction, unspecified: Secondary | ICD-10-CM | POA: Diagnosis not present

## 2016-02-09 DIAGNOSIS — R3912 Poor urinary stream: Secondary | ICD-10-CM | POA: Diagnosis not present

## 2016-02-09 DIAGNOSIS — R972 Elevated prostate specific antigen [PSA]: Secondary | ICD-10-CM | POA: Diagnosis not present

## 2016-02-09 MED FILL — OMEPRAZOLE DR 20 MG CAPSULE: 20 | 30 days supply | Qty: 30 | Fill #1

## 2016-02-10 DIAGNOSIS — F0280 Dementia in other diseases classified elsewhere without behavioral disturbance: Secondary | ICD-10-CM | POA: Diagnosis not present

## 2016-02-10 DIAGNOSIS — G3183 Dementia with Lewy bodies: Secondary | ICD-10-CM | POA: Diagnosis not present

## 2016-02-11 DIAGNOSIS — G3183 Dementia with Lewy bodies: Secondary | ICD-10-CM | POA: Diagnosis not present

## 2016-02-11 DIAGNOSIS — F0280 Dementia in other diseases classified elsewhere without behavioral disturbance: Secondary | ICD-10-CM | POA: Diagnosis not present

## 2016-02-19 ENCOUNTER — Telehealth: Payer: Self-pay

## 2016-02-19 NOTE — Telephone Encounter (Signed)
Received a fax from [facility] and it "stated that a medication reconcillation was preformed and the patient was currently taking Gabapentin 300mg  three times a day but it was D/C'D on medication profile. I spoke with the pt and he stated that he currently takes gabapentin 300mg  three times a day. I spoke with Sharyn Lull at  [facility] and I was advised that the medication would be updated in the system. She was not aware of any changes on her end.

## 2016-02-19 NOTE — Telephone Encounter (Deleted)
Received a fax

## 2016-02-19 NOTE — Telephone Encounter (Signed)
Spoke with Ian Gibbs at [facility] and she stated that the paper that was faxed over was to advise the provider that the gabapentin had to be added to the list since it was not on their current list and they had to write an order and it needed signing that the pt was currently taking the medication. Provider is aware.

## 2016-02-26 DIAGNOSIS — F0280 Dementia in other diseases classified elsewhere without behavioral disturbance: Secondary | ICD-10-CM | POA: Diagnosis not present

## 2016-02-26 DIAGNOSIS — G3183 Dementia with Lewy bodies: Secondary | ICD-10-CM | POA: Diagnosis not present

## 2016-02-26 MED FILL — CARBIDOPA-LEVODOPA 25-100 T: 25-100 | 30 days supply | Qty: 90 | Fill #1

## 2016-03-09 MED FILL — TRIAMCINOLONE 0.1% CREAM: 0.1 | 30 days supply | Qty: 45 | Fill #0

## 2016-03-09 MED FILL — SYNTHROID 75 MCG TABLET: 75 | 30 days supply | Qty: 30 | Fill #0

## 2016-03-15 ENCOUNTER — Other Ambulatory Visit: Payer: Self-pay | Admitting: Medical

## 2016-03-15 MED FILL — GABAPENTIN 300 MG CAPSULE: 300 | 60 days supply | Qty: 180 | Fill #1

## 2016-03-15 MED FILL — OMEPRAZOLE DR 20 MG CAPSULE: 20 | 30 days supply | Qty: 30 | Fill #2 | Status: TO

## 2016-03-18 ENCOUNTER — Telehealth: Payer: Self-pay | Admitting: Medical

## 2016-03-18 NOTE — Telephone Encounter (Signed)
Caller name: Hoyle Sauer with [facility] Relationship to patient: Can be reached: [phone]   Reason for call: called to f/u on 2 physician VO forms that were faxed on 03/10/16 to [phone]. She will refax today. They are needed for billing purposes and need to be returned asap as they are past 30 days. Dr. Etter Sjogren needs to sign.

## 2016-03-18 NOTE — Telephone Encounter (Signed)
W/Ian Gibbs

## 2016-03-22 NOTE — Telephone Encounter (Signed)
Late entry: forms were faxed on 03/18/16. JG//CMA

## 2016-03-30 MED FILL — CARBIDOPA-LEVODOPA 25-100 T: 25-100 | 30 days supply | Qty: 90 | Fill #0 | Status: TO

## 2016-03-31 ENCOUNTER — Other Ambulatory Visit: Payer: Self-pay

## 2016-03-31 MED ORDER — LEVOTHYROXINE SODIUM 75 MCG PO TABS: 75.0000 ug | ORAL_TABLET | Freq: Every day | ORAL | Status: DC

## 2016-03-31 MED FILL — SYNTHROID 75 MCG TABLET: 75 | 30 days supply | Qty: 30 | Fill #0 | Status: TO

## 2016-05-17 ENCOUNTER — Other Ambulatory Visit: Payer: Self-pay

## 2016-05-17 MED ORDER — OMEPRAZOLE 20 MG PO CPDR: 20.0000 mg | DELAYED_RELEASE_CAPSULE | Freq: Every day | ORAL | 5 refills | Status: DC

## 2016-05-17 NOTE — Telephone Encounter (Signed)
Pt needs to schedule a follow up with E. Saguier in the next month for 30 minutes.

## 2016-05-25 ENCOUNTER — Ambulatory Visit: Payer: Medicare Other | Admitting: Neurology

## 2016-05-26 ENCOUNTER — Other Ambulatory Visit: Payer: Self-pay

## 2016-05-26 MED ORDER — LEVOTHYROXINE SODIUM 75 MCG PO TABS: 75.0000 ug | ORAL_TABLET | Freq: Every day | ORAL | 1 refills | Status: DC

## 2016-06-03 ENCOUNTER — Telehealth: Payer: Self-pay | Admitting: Medical

## 2016-06-03 ENCOUNTER — Ambulatory Visit (INDEPENDENT_AMBULATORY_CARE_PROVIDER_SITE_OTHER): Payer: Medicare Other | Admitting: Medical

## 2016-06-03 ENCOUNTER — Encounter: Payer: Self-pay | Admitting: Medical

## 2016-06-03 VITALS — BP 121/63 | HR 90 | Temp 98.7°F | Ht 70.0 in | Wt 181.4 lb

## 2016-06-03 DIAGNOSIS — G300 Alzheimer's disease with early onset: Secondary | ICD-10-CM | POA: Diagnosis not present

## 2016-06-03 DIAGNOSIS — F0280 Dementia in other diseases classified elsewhere without behavioral disturbance: Secondary | ICD-10-CM | POA: Diagnosis not present

## 2016-06-03 DIAGNOSIS — G609 Hereditary and idiopathic neuropathy, unspecified: Secondary | ICD-10-CM | POA: Diagnosis not present

## 2016-06-03 MED ORDER — GABAPENTIN 300 MG PO CAPS: 300.0000 mg | ORAL_CAPSULE | Freq: Three times a day (TID) | ORAL | 2 refills | Status: DC

## 2016-06-03 NOTE — Progress Notes (Signed)
Pre visit review using our clinic tool,if applicable. No additional management support is needed unless otherwise documented below in the visit note.  

## 2016-06-03 NOTE — Telephone Encounter (Signed)
Ian Gibbs, Chart reviewed, I saw patient in April 2017, he complained a lot of dizziness, Namenda seems to have worsening his dizziness, so I have stopped Namenda,  At  that visit, patient reported he is not sure about the benefit of Sinemet 25/100 mg 3 times a day, I have advised him stop Sinemet to see that will help his dizziness.  It is okay to restart Sinemet at 25/100 tid.   Thanks for your feed back.  Marcial Pacas, M.D. Ph.D.  [facility] [place], [place] [address] Phone: [phone] Fax:      [phone]

## 2016-06-03 NOTE — Telephone Encounter (Signed)
Dr Krista Blue,   I saw Ian Gibbs today. I reviewed your last note and it states stop sinemet. Wife stated later it was re-started after you had discussion with home health nurse. She states he seems to be doing well with sinemet and she indicates she wants him to continue. Just wanted to clarify that with you.   Also in past he was on namenda. Pt is not taking this currently. Wanted to make sure that was correct. Thanks for seeing patient and appreciate your help with our patients.  Sincerely,  Kila Godina, Percell Miller, PA-C

## 2016-06-03 NOTE — Telephone Encounter (Signed)
Clarified with neurologist. He stated ok that sinemet was restarted. Stay off namenda. Notify pt wife.

## 2016-06-03 NOTE — Telephone Encounter (Signed)
Let pt know when are giving flu vaccines. Can get mid sept to early October. Assuming giving vaccine by then.

## 2016-06-03 NOTE — Progress Notes (Signed)
Subjective:    Patient ID: Ian Gibbs, male    DOB: [birth year], 75 y.o.   MRN: [id number]  HPI  Pt in for follow up. Pt thinks he is doing well. Wife thinks he is doing fairly well. She feels like his memory and ability to function is improving. He is able to go downstairs to eat meals. 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 Pt is on sinemet but not on namenda.  Pt sees Dr. Krista Blue at neurology. On review of last note appearas that he was told to stop sinemet. Pt feels like fluid gait. Not rigid. No shuffling.    Rt hip pain after moving/adjusted some furniture prior to getting carpets cleaned.  Pt wife is convinced he should be on sinemet. Last note stated discontinue but later home health talked with her and it was restarted.   Pt gabapentin is up for refill.     Review of Systems  Constitutional: Negative for chills, fatigue and fever.  HENT: Negative for congestion, ear discharge and ear pain.   Respiratory: Negative for cough, chest tightness, shortness of breath and wheezing.   Cardiovascular: Negative for chest pain and palpitations.  Neurological: Negative for dizziness and headaches.  Hematological: Negative for adenopathy. Does not bruise/bleed easily.  Psychiatric/Behavioral: Negative for agitation, behavioral problems, confusion, self-injury and suicidal ideas. The patient is not nervous/anxious.        Pleasant alert and oriented person, place and time.   Past Medical History:  Diagnosis Date  . Dizziness   . GERD (gastroesophageal reflux disease)   . Hyperlipemia   . Neuromuscular disorder ([place])    alzeimer, and parkinson per wife.  . Neuropathy ([place])   . Ptosis 2013  . Sleep apnea   . Thyroid disease   . Weakness      Social History   Social History  . Marital status: Married    Spouse name: [name]  . Number of children: 1  . Years of education: masters   Occupational History  .      Retired - Theme park manager   Social History Main Topics  . Smoking status:  Never Smoker  . Smokeless tobacco: Never Used  . Alcohol use No  . Drug use: No  . Sexual activity: Not on file   Other Topics Concern  . 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 CYST REMOVAL HAND    . LARYNX SURGERY    . THROAT SURGERY    . TONSILLECTOMY AND ADENOIDECTOMY    . TOTAL KNEE ARTHROPLASTY     Right    Family History  Problem Relation Age of Onset  . Seizures Mother   . Cancer - Other Father   . Cancer - Other Maternal Grandmother     No Known Allergies  Current Outpatient Prescriptions on File Prior to Visit  Medication Sig Dispense Refill  . carbidopa-levodopa (SINEMET IR) 25-100 MG tablet TAKE 1 TABLET BY MOUTH 3 TIMES DAILY. 90 tablet 1  . gabapentin (NEURONTIN) 300 MG capsule Take 1 capsule (300 mg total) by mouth 3 (three) times daily. 180 capsule 1  . levothyroxine (SYNTHROID, LEVOTHROID) 75 MCG tablet Take 1 tablet (75 mcg total) by mouth daily before breakfast.  30 tablet 1  . Multiple Vitamins-Minerals (MULTIVITAMIN PO) Take by mouth daily.    Marland Kitchen omeprazole (PRILOSEC) 20 MG capsule Take 1 capsule (20 mg total) by mouth daily. 30 capsule 5  . OVER THE COUNTER MEDICATION Taking Persavision tablets once daily.    . simvastatin (ZOCOR) 20 MG tablet Take 0.5 tablets (10 mg total) by mouth every evening. 30 tablet 3   No current facility-administered medications on file prior to visit.     BP 121/63   Pulse 90   Temp 98.7 F (37.1 C) (Oral)   Ht 5\' 10"  (1.778 m)   Wt 181 lb 6.4 oz (82.3 kg)   SpO2 98%   BMI 26.03 kg/m       Objective:   Physical Exam  General Mental Status- Alert. General Appearance- Not in acute distress.   Skin General: Color- Normal Color. Moisture- Normal Moisture.  Neck Carotid Arteries- Normal color. Moisture-  Normal Moisture. No carotid bruits. No JVD.  Chest and Lung Exam Auscultation: Breath Sounds:-Normal.  Cardiovascular Auscultation:Rythm- Regular. Murmurs & Other Heart Sounds:Auscultation of the heart reveals- No Murmurs.  Abdomen Inspection:-Inspeection Normal. Palpation/Percussion:Note:No mass. Palpation and Percussion of the abdomen reveal- Non Tender, Non Distended + BS, no rebound or guarding.    Neurologic Cranial Nerve exam:- CN III-XII intact(No nystagmus), symmetric smile. Strength:- 5/5 equal and symmetric strength both upper and lower extremities. No rigid upper ext movements. No shuffled gait today.      Assessment & Plan:  Your mood is good and you are oriented today. Also doing activities of daily living well. I would continue current med regimen and will clarify regimen with your neurologist.   I refilled your gabapentin today.  Follow up in 3-4 months or as needed  Wife reminds me psa is being followed by Dr. Rosana Hoes urologist.  Mackie Pai, PA-C

## 2016-06-03 NOTE — Patient Instructions (Signed)
Your mood is good and you are oriented today. Also doing activities of daily living well. I would continue current med regimen and will clarify regimen with your neurologist.   I refilled your gabapentin today.  Follow up in 3-4 months or as needed

## 2016-06-04 ENCOUNTER — Other Ambulatory Visit: Payer: Self-pay

## 2016-06-04 MED ORDER — GABAPENTIN 300 MG PO CAPS: 300.0000 mg | ORAL_CAPSULE | Freq: Three times a day (TID) | ORAL | 2 refills | Status: DC

## 2016-06-04 NOTE — Telephone Encounter (Signed)
Called patient. Spoke with wife. States patient has already started Sinemet and stopped Namenda.

## 2016-06-04 NOTE — Telephone Encounter (Signed)
Per ES called patient who's wife answered the phone. Advised her that Flu shots are available however,it is advised be E.Saguier

## 2016-06-04 NOTE — Telephone Encounter (Signed)
Called patient to inform Flu vaccines have arrived however per E. Saguier it is advised that he wait until med September early October. Wife states they are scheduled to have their flu shots at the facility were they live and will let us know. When they do.

## 2016-06-11 NOTE — Telephone Encounter (Signed)
lvm advising patient to schedule appointment °

## 2016-06-18 ENCOUNTER — Other Ambulatory Visit: Payer: Self-pay

## 2016-06-21 ENCOUNTER — Other Ambulatory Visit: Payer: Self-pay

## 2016-06-21 MED ORDER — CARBIDOPA-LEVODOPA 25-100 MG PO TABS: 1.0000 | ORAL_TABLET | Freq: Three times a day (TID) | ORAL | 1 refills | Status: DC

## 2016-06-23 ENCOUNTER — Other Ambulatory Visit: Payer: Self-pay

## 2016-06-23 ENCOUNTER — Encounter: Payer: Self-pay | Admitting: Medical

## 2016-06-23 ENCOUNTER — Telehealth: Payer: Self-pay | Admitting: Medical

## 2016-06-23 MED ORDER — CARBIDOPA-LEVODOPA 25-100 MG PO TABS: 1.0000 | ORAL_TABLET | Freq: Three times a day (TID) | ORAL | 1 refills | Status: DC

## 2016-06-23 MED ORDER — LEVOTHYROXINE SODIUM 75 MCG PO TABS: 75.0000 ug | ORAL_TABLET | Freq: Every day | ORAL | 1 refills | Status: DC

## 2016-06-23 NOTE — Telephone Encounter (Signed)
Refill re-sent to [facility].

## 2016-06-23 NOTE — Telephone Encounter (Signed)
error:[id number] ° °

## 2016-06-23 NOTE — Telephone Encounter (Signed)
Caller name: Mrs. Fabbri  Relation to [id number]  Call back [place]: [place], [place] - [address] [phone] (Phone) [phone] (Fax)    Reason for call:  Spouse requesting a refill carbidopa-levodopa (SINEMET IR) 25-100 MG tablet and levothyroxine (SYNTHROID, LEVOTHROID) 75 MCG tablet

## 2016-06-24 NOTE — Telephone Encounter (Signed)
Pt's wife would like a 6 month supply sent in to pharmacy.  Please advise.

## 2016-06-25 NOTE — Telephone Encounter (Signed)
Patients wife called with concerns about him . She refused to give scheduler a reason for this call. She asked to speak directly to Myself of [facility]. Called patient back and she stated the medications that we refilled where not abl to be filled because it is showing that it has been filled at another pharmacy already. I informed patient that the chart shows that the medication was discontinued her. She stated this was supposed to be taken care of when she talked to the nurse yesterday because now her husband cannot get his medication and he has been out all week. I told patient I would call Pharmacy and see if it is was canceled and then I will call her back.   Contacted Pharmacy and the medication was in the bin Pam from [facility] said she would put the medication back and the patient could have the other pharmacy try to fill it again in about 5 minutes  I called [facility] the medication is ready and will be delivery to the patient.  Notified the patient medications were filled and Pharmacy would delivery as they requested.  Patient is still waiting on the 6 month approval for the medications

## 2016-06-27 ENCOUNTER — Telehealth: Payer: Self-pay | Admitting: Medical

## 2016-06-27 NOTE — Telephone Encounter (Signed)
Will you [facility] that her syntrhoid was canceled. Will you call [facility] and confirm that they will deliver.

## 2016-06-28 NOTE — Telephone Encounter (Signed)
Oneida Alar, NT  3:49 PM  Note    Patients wife called with concerns about him . She refused to give scheduler a reason for this call. She asked to speak directly to Myself of [facility]. Called patient back and she stated the medications that we refilled where not abl to be filled because it is showing that it has been filled at another pharmacy already. I informed patient that the chart shows that the medication was discontinued her. She stated this was supposed to be taken care of when she talked to the nurse yesterday because now her husband cannot get his medication and he has been out all week. I told patient I would call Pharmacy and see if it is was canceled and then I will call her back.   Contacted Pharmacy and the medication was in the bin Pam from [facility] said she would put the medication back and the patient could have the other pharmacy try to fill it again in about 5 minutes  I called [facility] the medication is ready and will be delivery to the patient.       Notified the patient medications were filled and Pharmacy would delivery as they requested.  Patient is still waiting on the 6 month approval for the medications

## 2016-06-30 ENCOUNTER — Telehealth: Payer: Self-pay | Admitting: Medical

## 2016-06-30 NOTE — Telephone Encounter (Signed)
I saw this past message sent to me 2 days ago. Monday. I talked with her on Sunday and thought all his med issues were resolved. What happened? Just need to make sure based on her already upset about long wait times etc.

## 2016-07-01 NOTE — Telephone Encounter (Signed)
Called to follow up with wife.  Wife states everything has been taken care of.

## 2016-07-05 NOTE — Telephone Encounter (Signed)
Wife states no additional needs at this time.

## 2016-07-08 ENCOUNTER — Ambulatory Visit (INDEPENDENT_AMBULATORY_CARE_PROVIDER_SITE_OTHER): Payer: Medicare Other | Admitting: Behavioral Health

## 2016-07-08 DIAGNOSIS — Z23 Encounter for immunization: Secondary | ICD-10-CM | POA: Diagnosis not present

## 2016-07-08 NOTE — Progress Notes (Signed)
Pre visit review using our clinic review tool, if applicable. No additional management support is needed unless otherwise documented below in the visit note.  Patient in clinic today for Influenza vaccination. IM given in Left Deltoid. Patient tolerated injection well.

## 2016-07-26 ENCOUNTER — Encounter: Payer: Self-pay | Admitting: Neurology

## 2016-07-26 ENCOUNTER — Ambulatory Visit (INDEPENDENT_AMBULATORY_CARE_PROVIDER_SITE_OTHER): Payer: Medicare Other | Admitting: Neurology

## 2016-07-26 VITALS — BP 119/70 | HR 83 | Ht 70.0 in | Wt 181.8 lb

## 2016-07-26 DIAGNOSIS — R269 Unspecified abnormalities of gait and mobility: Secondary | ICD-10-CM | POA: Diagnosis not present

## 2016-07-26 DIAGNOSIS — G3184 Mild cognitive impairment, so stated: Secondary | ICD-10-CM | POA: Diagnosis not present

## 2016-07-26 DIAGNOSIS — H532 Diplopia: Secondary | ICD-10-CM

## 2016-07-26 DIAGNOSIS — R42 Dizziness and giddiness: Secondary | ICD-10-CM | POA: Diagnosis not present

## 2016-07-26 DIAGNOSIS — I951 Orthostatic hypotension: Secondary | ICD-10-CM | POA: Diagnosis not present

## 2016-07-26 MED ORDER — CARBIDOPA-LEVODOPA 25-100 MG PO TABS: 1.0000 | ORAL_TABLET | Freq: Three times a day (TID) | ORAL | 4 refills | Status: DC

## 2016-07-26 MED ORDER — CARBIDOPA-LEVODOPA 25-100 MG PO TABS: 1.0000 | ORAL_TABLET | Freq: Three times a day (TID) | ORAL | 11 refills | Status: DC

## 2016-07-26 NOTE — Progress Notes (Signed)
No chief complaint on file.    [facility]  PATIENT: Ian Gibbs DOB: 05-15-1941   REASON FOR VISIT: Follow-up for diplopia, dizziness, history of obstructive sleep apnea   HISTORY OF PRESENT ILLNESS: Ian Gibbs 75 yo RH WM with wife, is referred by his primary care physician Dr. Morrie Sheldon for evaluation of peripheral neuropathy, ptosis, blurry vision, dizziness, generalized weakness, Initial visit, March 2015.  He had past medical history of hypothyroidism, hyperlipidemia [place], he developed gradual onset difficulty talking, his voice varies from high pitch to low, he also has mild dysphagia, [place] was treated at the [facility] by ENT, was diagnosed with bilateral vocal cord paralysis of unknown etiology, had surgery, which helped his symptoms some In 2012, he noticed bilateral feet discomfort, numbness tingling, burning discomfort, getting worse after bearing weight, gradually getting worse, he was evaluated by [facility]ist Dr. Ellender Hose, reported abnormal EMG nerve conduction study consistent with peripheral neuropathy, notes also reported extensive laboratory evaluation, including vitamin B1, IFEP was normal, Over the years, his symptoms has been controlled with titrating dose of gabapentin, currently he is taking 600 mg 3 times a day.  In 2013, he also developed generalized weakness, intermittent ptosis, occasionally gait difficulty, getting worse over past 1 year, over past 6 months, he noticed worsening ptosis, also intermittent double vision, especially with prolonged reading, speech is slurred, mild swallowing difficulties, gait difficulty, he also complains of dizziness, lightheadedness after prolonged standing,  MRI of the brain March 2015, showed moderate perisylvian fissure atrophy, mild to moderate small vessel disease, no acute lesions.  Laboratory showed normal or Negative  RPR, B12, C. reactive protein, folic acid, TSH, ESR, ANA, acetylcholine receptor antibody, EMG nerve conduction study March 2015,  showed length dependent mild axonal peripheral neuropathy, repetitive stimulation of right accessory nerve, recording at right upper trapezius showed no abnormalities. He continue complains of fatigue, lack of stamina, with prolonged walking, he complains of low back pain, bilateral calf muscle tightness, achiness, he is taking Mestinon 60 mg 3 times a day, tolerating it well, but there was no significant improvement MRI of the lumbar, only mild degenerative disc disease, there is no significant canal, of foraminal stenosis, Athena diagnostic testing showed negative musk antibody, borderline antibody to acetylcholine receptor. CT of the chest showed mild cardiomegaly, no acute lesions, no thymus pathology noticed. He also complains of daytime sleepiness, snoring, ESS score is 12, he tends to sleep while sitting down reading, watching TV, even sitting inactive in public places, lying down rest in the afternoon, after lunch, FSS score is 47, he also complains of dry mouth, frequent awakening at night time, he had sleep study, was put on CPAP machine,  UPDATE Jul 01 2014: He continues to get weaker, he complains of dizziness when getting up, lightheaded sensation, no spinning around, he has unsteady gait, rely on his cane, mild short-term memory trouble. His maternal grandmother, mother had Alzheimer's disease. I have reviewed the [facility] neuromuscular consultation in August 2015, and single-fiber EMG by Dr. Vallarie Mare, the conclusion was normal study. The main jitter was 33.46m, less than the normal range of 41, only one pair was abnormal at 106, and that particular repair showed blocking, 2 other pairs approached the upper limits of normal, and did not show blocking.  UPDATE March 22nd 2016: Right knee replacement in Oct 16th 2015, he recovered very well, no significant  right knee pain, but he  continue have gait difficulty, fell twice since, in addition, since January 2016, he developed worsening bowel and bladder incontinence,   Last visit was with Hoyle Sauer in November 2015, he was diagnosed with obstructive sleep apnea and uses CPAP. Using CPAP machine, overall much improved quality of sleep,  He complains of dizziness, fussy, at different times, mostly happened when he stands, blurry vision, lasting for a few minutes, worsening memory trouble, today's Mini-Mental status examination is 28 out of 30, animal naming is 69  UPDATE April 28th 2016: He complains of feeling all the time, increased balance difficulty, rely on his cane, especially in past 2 weeks,he has no incontinence, he also complains of numbness at his feet, lasting for few minutes, vision is blurred, difficulty to read, even with new glasses prescirptions, he has lost sense of smell, he now sleeps well with CPAP  We have reviewed MRI of cervical spine, multilevel degenerative changes, foraminal stenosis at different levels, but no significant canal stenosis  UPDATE May 15 2015: He is with his wife, complains of dizziness when standing up quickly, there was no orthostatic blood pressure changes today, On further questioning, his complains of dizziness is unsteady gait, he has gradually declined functional status, has become more sedentary, unsure on his feet He continue blurry vision, could no longer read much   UPDATE Feb 9th 2017: He continues to decline slowly, he has more slow walking, mildly increased gait difficulty, he denies significant pain, he denies incontinence, does has urinary urgency, he has no dysphagia, no chewing difficulty,  No limb wekaness, he sleeps well now with CPAP machine,  He has mild memory trouble, he enjoys reading, but has trouble keeping up sometimes, got confused about his driving direction occasionally   He has strong family history of dementia, his maternal  grandmother, mother also suffered Alzheimer's disease.  UPDATE January 29 2016: Since he stopped Namenda in March 2017, his dizziness has improved, he still has positional related, dizziness, lightheadness when standing up quickly or with prolonged, walking.  He is taking Sinemet 25/100 tid, not sure about the benefit,   Wife reported one episode of fever, not feeling well, and then transient loss of consciousness, went limb, staring into the space lasting for a few minutes in March 2017, per wife, the blood pressure was elevated 150 during event,  The most bothersome symptoms for patient is intermittent dizziness especially with standing up quickly or with prolonged walking, continued mild worsening gait difficulty, bilateral feet paresthesia  UPDATE Oct 16th 2017: He continued to complains of mild memory loss, intermittent double vision, gait abnormality, orthostatic dizziness 50% of the time, he is able to tolerate Sinemet 25/100 mg, 3 times a day, which did help his walking some,  REVIEW OF SYSTEMS: Full 14 system review of systems performed and notable only for those listed, all others are neg: Activity change, fatigue, ringing ears, drooling, light sensitivity, double vision, apnea, walking difficulty, memory loss, dizziness, numbness, speech difficulty, weakness, facial drooping  ALLERGIES: No Known Allergies  HOME MEDICATIONS: Outpatient Medications Prior to Visit  Medication Sig Dispense Refill  . carbidopa-levodopa (SINEMET IR) 25-100 MG tablet Take 1 tablet by mouth 3 (three) times daily. 90 tablet 1  . carbidopa-levodopa (SINEMET IR) 25-100 MG tablet Take 1 tablet by mouth 3 (three) times daily. 90 tablet 1  . gabapentin (NEURONTIN) 300 MG capsule Take 1 capsule (300 mg total) by mouth 3 (three) times daily. Please deliver to patient. 180 capsule 2  . levothyroxine (SYNTHROID, LEVOTHROID)  75 MCG tablet Take 1 tablet (75 mcg total) by mouth daily before breakfast. 30 tablet 1  .  Multiple Vitamins-Minerals (MULTIVITAMIN PO) Take by mouth daily.    Marland Kitchen omeprazole (PRILOSEC) 20 MG capsule Take 1 capsule (20 mg total) by mouth daily. 30 capsule 5  . OVER THE COUNTER MEDICATION Taking Persavision tablets once daily.    . simvastatin (ZOCOR) 20 MG tablet Take 0.5 tablets (10 mg total) by mouth every evening. 30 tablet 3   No facility-administered medications prior to visit.     PAST MEDICAL HISTORY: Past Medical History:  Diagnosis Date  . Dizziness   . GERD (gastroesophageal reflux disease)   . Hyperlipemia   . Neuromuscular disorder ([place])    alzeimer, and parkinson per wife.  . Neuropathy ([place])   . Ptosis 2013  . Sleep apnea   . Thyroid disease   . Weakness     PAST SURGICAL HISTORY: Past Surgical History:  Procedure Laterality Date  . CYST REMOVAL HAND    . LARYNX SURGERY    . THROAT SURGERY    . TONSILLECTOMY AND ADENOIDECTOMY    . TOTAL KNEE ARTHROPLASTY     Right    FAMILY HISTORY: Family History  Problem Relation Age of Onset  . Seizures Mother   . Cancer - Other Father   . Cancer - Other Maternal Grandmother     SOCIAL HISTORY: Social History   Social History  . Marital status: Married    Spouse name: [name]  . Number of children: 1  . Years of education: masters   Occupational History  .      Retired - Theme park manager   Social History Main Topics  . Smoking status: Never Smoker  . Smokeless tobacco: Never Used  . Alcohol use No  . Drug use: No  . Sexual activity: Not on file   Other Topics Concern  . 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 There is no height or weight on file to calculate BMI. PHYSICAL EXAMNIATION:  Gen: NAD, conversant, well nourised, obese, well groomed                     Cardiovascular: Regular  rate rhythm, no peripheral edema, warm, nontender. Eyes: Conjunctivae clear without exudates or hemorrhage Neck: Supple, no carotid bruise. Pulmonary: Clear to auscultation bilaterally   NEUROLOGICAL EXAM:  MENTAL STATUS: Speech:    Speech is soft, fluent and spontaneous with normal comprehension.  Cognition: Mini-Mental Status Examination is 29 out of 30, animal naming is 10     Orientation to time, place and person: He is not oriented to doctor's office     Recent and remote memory, she missed 1 out of 3 recalls     Normal Attention span and concentration     Normal Language, naming, repeating,spontaneous speech     Fund of knowledge  CRANIAL NERVES: CN II: Visual fields are full to confrontation. Fundoscopic exam is normal with sharp discs and no vascular changes. Venous pulsations are present bilaterally. Pupils are 4 mm and briskly reactive to light. Visual acuity is 20/20 bilaterally. CN III, IV, VI: extraocular movement are normal. Left ptosis, CN V:  Facial sensation is intact to pinprick in all 3 divisions bilaterally. Corneal responses are intact.  CN VII: Face is symmetric, mild bilateral eye-closure, cheek puff weakness, he has static left ptosis  CN VIII: Hearing is normal to rubbing fingers CN IX, X: Palate elevates symmetrically. Phonation is normal. CN XI: Head turning and shoulder shrug are intact CN XII: Tongue is midline with normal movements and no atrophy.  MOTOR: Normal muscle bulk, and strength, he has mild limb and nuchal rigidity,  REFLEXES: Reflexes are hypoactive and symmetric. Plantar responses are flexor.  SENSORY: He has length dependent decreased light touch pinprick to above ankle level, absent vibratory sensation to ankle level.  COORDINATION: Rapid alternating movements and fine finger movements are intact. There is no dysmetria on finger-to-nose and heel-knee-shin. There are no abnormal or extraneous movements.   GAIT/STANCE: Stoop forward,  bilateral valgrus knee, cautious, mildly unsteady, decreased bilateral arm swing, tends to lean his body towards the right side  DIAGNOSTIC DATA (LABS, IMAGING, TESTING) - I reviewed patient records, labs, notes, testing and imaging myself where available. MRI brain March 2015:  moderate perisylvian fissure atrophy, mild to moderate small vessel disease, no acute lesions.  MRI cervical spines April 2016: C5-6: severe biforaminal stenosis and mild spinal stenosis; no cord signal abnormalities. C4-5: moderate biforaminal stenosis. At C3-4: moderate right and mild left foraminal stenosis. At C6-7: moderate left foraminal stenosis.  MRI lumbar 2015:  L3-4: facet hypertrophy with no spinal stenosis or foraminal narrowing. L4-5: disc bulging and facet hypertrophy with no spinal stenosis or foraminal narrowing   Nerve conduction EMG in March 2015, mild length dependent axonal peripheral neuropathy  ASSESSMENT AND PLAN Mild cognitive impairment:  He has strong family history of Alzheimer's disease,  Today Mini-Mental Status Examination is 19 out of 30  he could not tolerateda Namenda 10 mg twice a day due to increased dizziness Diplopia  Extensive evaluations, no evidence of neuromuscular junctional disorder, acetylcholine receptor antibody was negative, Athena diagnostics testing showed borderline acetylcholine receptor binding antibody in the past,  Consider repeat acetylcholine receptor binding antibody  Obstructive sleep apnea  Using CPAP machine, Peripheral neuropathy   Continue gabapentin 300 mg 3 times a day,  Worsening Gait difficulty  Multifactorial, includes peripheral neuropathy,valgrus knee, deconditioning there was no evidence of orthostatic blood pressure changes  Have encouraged him continue moderate exercise   He has mild parkinsonian features,  Sinemet 25/100 mg 3 times seems to help him some, refill his prescription  Orthostatic hypotension:   this is related to his central  nervous system degenerative disorder, he has mild cognitive impairment, mild parkinsonian features, strong family history of dementia,  His orthostatic blood pressure ranges is also related to his peripheral neuropathy  I have advised him keep well hydration  Keep moderate exercise  Return to clinic in one year  Marcial Pacas, M.D. Ph.D.  [facility] [place], [place] [address] Phone: [phone] Fax:      [phone]

## 2016-07-29 ENCOUNTER — Ambulatory Visit: Payer: Medicare Other | Admitting: Neurology

## 2016-08-23 ENCOUNTER — Other Ambulatory Visit: Payer: Self-pay

## 2016-08-23 MED ORDER — LEVOTHYROXINE SODIUM 75 MCG PO TABS: 75.0000 ug | ORAL_TABLET | Freq: Every day | ORAL | 3 refills | Status: DC

## 2016-10-14 ENCOUNTER — Ambulatory Visit: Payer: Medicare Other | Admitting: Medical

## 2016-10-21 ENCOUNTER — Ambulatory Visit (INDEPENDENT_AMBULATORY_CARE_PROVIDER_SITE_OTHER): Payer: Medicare Other | Admitting: Medical

## 2016-10-21 ENCOUNTER — Encounter: Payer: Self-pay | Admitting: Medical

## 2016-10-21 ENCOUNTER — Telehealth: Payer: Self-pay | Admitting: Medical

## 2016-10-21 VITALS — BP 131/71 | HR 96 | Temp 97.9°F | Ht 70.0 in | Wt 185.4 lb

## 2016-10-21 DIAGNOSIS — R972 Elevated prostate specific antigen [PSA]: Secondary | ICD-10-CM

## 2016-10-21 DIAGNOSIS — E039 Hypothyroidism, unspecified: Secondary | ICD-10-CM | POA: Diagnosis not present

## 2016-10-21 DIAGNOSIS — E871 Hypo-osmolality and hyponatremia: Secondary | ICD-10-CM

## 2016-10-21 DIAGNOSIS — F0280 Dementia in other diseases classified elsewhere without behavioral disturbance: Secondary | ICD-10-CM | POA: Diagnosis not present

## 2016-10-21 DIAGNOSIS — G300 Alzheimer's disease with early onset: Secondary | ICD-10-CM | POA: Diagnosis not present

## 2016-10-21 DIAGNOSIS — D696 Thrombocytopenia, unspecified: Secondary | ICD-10-CM | POA: Diagnosis not present

## 2016-10-21 DIAGNOSIS — R739 Hyperglycemia, unspecified: Secondary | ICD-10-CM

## 2016-10-21 DIAGNOSIS — G4730 Sleep apnea, unspecified: Secondary | ICD-10-CM | POA: Diagnosis not present

## 2016-10-21 LAB — CBC WITH DIFFERENTIAL/PLATELET
BASOS ABS: 0 10*3/uL (ref 0.0–0.1)
BASOS PCT: 0.4 % (ref 0.0–3.0)
EOS PCT: 0.8 % (ref 0.0–5.0)
Eosinophils Absolute: 0.1 10*3/uL (ref 0.0–0.7)
HEMATOCRIT: 41.2 % (ref 39.0–52.0)
Hemoglobin: 14.1 g/dL (ref 13.0–17.0)
LYMPHS PCT: 18.9 % (ref 12.0–46.0)
Lymphs Abs: 1.6 10*3/uL (ref 0.7–4.0)
MCHC: 34.2 g/dL (ref 30.0–36.0)
MCV: 91.8 fl (ref 78.0–100.0)
MONOS PCT: 6.3 % (ref 3.0–12.0)
Monocytes Absolute: 0.5 10*3/uL (ref 0.1–1.0)
NEUTROS ABS: 6.2 10*3/uL (ref 1.4–7.7)
Neutrophils Relative %: 73.6 % (ref 43.0–77.0)
PLATELETS: 206 10*3/uL (ref 150.0–400.0)
RBC: 4.49 Mil/uL (ref 4.22–5.81)
RDW: 13.4 % (ref 11.5–15.5)
WBC: 8.4 10*3/uL (ref 4.0–10.5)

## 2016-10-21 LAB — TSH: TSH: 2.11 u[IU]/mL (ref 0.35–4.50)

## 2016-10-21 LAB — COMPREHENSIVE METABOLIC PANEL
ALT: 10 U/L (ref 0–53)
AST: 22 U/L (ref 0–37)
Albumin: 4 g/dL (ref 3.5–5.2)
Alkaline Phosphatase: 61 U/L (ref 39–117)
BUN: 18 mg/dL (ref 6–23)
CALCIUM: 9.4 mg/dL (ref 8.4–10.5)
CHLORIDE: 102 meq/L (ref 96–112)
CO2: 28 meq/L (ref 19–32)
Creatinine, Ser: 1.07 mg/dL (ref 0.40–1.50)
GFR: 71.04 mL/min (ref >60.00)
Glucose, Bld: 151 mg/dL — ABNORMAL HIGH (ref 70–99)
POTASSIUM: 4.3 meq/L (ref 3.5–5.1)
Sodium: 138 mEq/L (ref 135–145)
Total Bilirubin: 0.6 mg/dL (ref 0.2–1.2)
Total Protein: 7.5 g/dL (ref 6.0–8.3)

## 2016-10-21 NOTE — Telephone Encounter (Signed)
Pt blood sugar recently elevated. Want to add a1c. Blood drawn yesterday. Please add a1c.

## 2016-10-21 NOTE — Progress Notes (Signed)
Pre visit review using our clinic review tool, if applicable. No additional management support is needed unless otherwise documented below in the visit note. 

## 2016-10-21 NOTE — Progress Notes (Signed)
Subjective:    Patient ID: Ian Gibbs, male    DOB: January 10, 1941, 76 y.o.   MRN: [id number]  HPI   Pt in with his wife.   Pt is stable per wife with his dementia and parkinsons per wife. Pt has seen neurologist in October per his wife(reviewed that note today). Pt MMSE that day was 29/30. Pt is on sinemet still.  Namenda was dc'd due to side effects,  Low thyroid hx. Last tsh 10 months ago was normal.  Pt peripheral neuropathy. He is satisfied with gabapentin.  Sleep apnea history and on cpap.  Pt has seen urologist since last visit. I don't see note but wife states urologist following elevated psa. She states benign elevation.     Review of Systems  Constitutional: Negative for chills and fatigue.  HENT: Negative for congestion and sinus pressure.   Respiratory: Negative for cough, chest tightness, shortness of breath and wheezing.   Cardiovascular: Negative for chest pain and palpitations.  Gastrointestinal: Negative for abdominal pain, constipation, diarrhea and vomiting.  Musculoskeletal: Negative for back pain and myalgias.  Skin: Negative for rash.  Neurological: Negative for dizziness, weakness and headaches.       Dementia. Stable. Parkinson as well stable.  Hematological: Negative for adenopathy. Does not bruise/bleed easily.  Psychiatric/Behavioral:       Stable. Good mood as always.    Past Medical History:  Diagnosis Date  . Dizziness   . GERD (gastroesophageal reflux disease)   . Hyperlipemia   . Neuromuscular disorder ([place])    alzeimer, and parkinson per wife.  . Neuropathy ([place])   . Ptosis 2013  . Sleep apnea   . Thyroid disease   . Weakness      Social History   Social History  . Marital status: Married    Spouse name: [name]  . Number of children: 1  . Years of education: masters   Occupational History  .      Retired - Theme park manager   Social History Main Topics  . Smoking status: Never Smoker  . Smokeless tobacco: Never Used  . Alcohol  use No  . Drug use: No  . Sexual activity: Not on file   Other Topics Concern  . 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 CYST REMOVAL HAND    . LARYNX SURGERY    . THROAT SURGERY    . TONSILLECTOMY AND ADENOIDECTOMY    . TOTAL KNEE ARTHROPLASTY     Right    Family History  Problem Relation Age of Onset  . Seizures Mother   . Cancer - Other Father   . Cancer - Other Maternal Grandmother     No Known Allergies  Current Outpatient Prescriptions on File Prior to Visit  Medication Sig Dispense Refill  . carbidopa-levodopa (SINEMET IR) 25-100 MG tablet Take 1 tablet by mouth 3 (three) times daily. 90 tablet 11  . gabapentin (NEURONTIN) 300 MG capsule Take 1 capsule (300 mg total) by mouth 3 (three) times daily. Please deliver to patient. 180 capsule 2  . levothyroxine (SYNTHROID, LEVOTHROID) 75 MCG tablet Take 1 tablet (75 mcg total) by mouth daily before breakfast. 30 tablet 3  . Multiple Vitamins-Minerals (MULTIVITAMIN  PO) Take by mouth daily.    . Multiple Vitamins-Minerals (PRESERVISION AREDS PO) Take by mouth daily.    Marland Kitchen omeprazole (PRILOSEC) 20 MG capsule Take 1 capsule (20 mg total) by mouth daily. 30 capsule 5  . simvastatin (ZOCOR) 20 MG tablet Take 0.5 tablets (10 mg total) by mouth every evening. 30 tablet 3   No current facility-administered medications on file prior to visit.     BP 131/71 (BP Location: Left Arm, Patient Position: Sitting, Cuff Size: Large)   Pulse 96   Temp 97.9 F (36.6 C) (Oral)   Ht 5\' 10"  (1.778 m)   Wt 185 lb 6.4 oz (84.1 kg)   SpO2 96% Comment: RA  BMI 26.60 kg/m       Objective:   Physical Exam  General Mental Status- Alert. General Appearance- Not in acute distress.   Skin General: Color- Normal Color. Moisture- Normal  Moisture.  Neck Carotid Arteries- Normal color. Moisture- Normal Moisture. No carotid bruits. No JVD.  Chest and Lung Exam Auscultation: Breath Sounds:-Normal.  Cardiovascular Auscultation:Rythm- Regular. Murmurs & Other Heart Sounds:Auscultation of the heart reveals- No Murmurs.  Abdomen Inspection:-Inspeection Normal. Palpation/Percussion:Note:No mass. Palpation and Percussion of the abdomen reveal- Non Tender, Non Distended + BS, no rebound or guarding.  Neurologic Cranial Nerve exam:- CN III-XII intact(No nystagmus), symmetric smile. Strength:- 5/5 equal and symmetric strength both upper and lower extremities.     Assessment & Plan:  Your dementia and parkinson appear stable. Continue current meds and follow up with neurologist.  For elevated psa continue follow ups with urologist.  Will check your tsh today. Also check your cmp today as well as cbc.(review based on med use, hx of low na, and mild low platelets).  Continue cpap.  Follow up in 6 months or as needed  Thara Searing, Percell Miller, [employer]

## 2016-10-21 NOTE — Patient Instructions (Addendum)
Your dementia and parkinson appear stable. Continue current meds and follow up with neurologist.  For elevated psa continue follow ups with urologist.  Will check your tsh today. Also check your cmp today as well as cbc.(review based on med use, hx of low na, and mild low platelets).  Continue cpap.  Follow up in 6 months or as needed

## 2016-10-22 ENCOUNTER — Other Ambulatory Visit (INDEPENDENT_AMBULATORY_CARE_PROVIDER_SITE_OTHER): Payer: Medicare Other

## 2016-10-22 DIAGNOSIS — R739 Hyperglycemia, unspecified: Secondary | ICD-10-CM | POA: Diagnosis not present

## 2016-10-22 LAB — HEMOGLOBIN A1C: Hgb A1c MFr Bld: 5.7 % (ref 4.6–6.5)

## 2016-10-22 NOTE — Telephone Encounter (Signed)
Lab added . Done

## 2016-10-26 ENCOUNTER — Ambulatory Visit: Payer: Medicare Other | Admitting: Podiatry

## 2016-11-04 ENCOUNTER — Encounter (HOSPITAL_BASED_OUTPATIENT_CLINIC_OR_DEPARTMENT_OTHER): Payer: Self-pay | Admitting: Emergency Medicine

## 2016-11-04 ENCOUNTER — Emergency Department (HOSPITAL_BASED_OUTPATIENT_CLINIC_OR_DEPARTMENT_OTHER): Payer: Medicare Other

## 2016-11-04 ENCOUNTER — Emergency Department (HOSPITAL_BASED_OUTPATIENT_CLINIC_OR_DEPARTMENT_OTHER)
Admission: EM | Admit: 2016-11-04 | Discharge: 2016-11-04 | Disposition: A | Discharge status: Home / Self Care | Payer: Medicare Other | Attending: Emergency Medicine | Admitting: Emergency Medicine

## 2016-11-04 ENCOUNTER — Other Ambulatory Visit: Payer: Self-pay | Admitting: Emergency Medicine

## 2016-11-04 DIAGNOSIS — S4992XA Unspecified injury of left shoulder and upper arm, initial encounter: Secondary | ICD-10-CM | POA: Diagnosis not present

## 2016-11-04 DIAGNOSIS — Y999 Unspecified external cause status: Secondary | ICD-10-CM | POA: Diagnosis not present

## 2016-11-04 DIAGNOSIS — S299XXA Unspecified injury of thorax, initial encounter: Secondary | ICD-10-CM | POA: Diagnosis present

## 2016-11-04 DIAGNOSIS — Y92009 Unspecified place in unspecified non-institutional (private) residence as the place of occurrence of the external cause: Secondary | ICD-10-CM | POA: Diagnosis not present

## 2016-11-04 DIAGNOSIS — Y939 Activity, unspecified: Secondary | ICD-10-CM | POA: Diagnosis not present

## 2016-11-04 DIAGNOSIS — E039 Hypothyroidism, unspecified: Secondary | ICD-10-CM | POA: Diagnosis not present

## 2016-11-04 DIAGNOSIS — W0110XA Fall on same level from slipping, tripping and stumbling with subsequent striking against unspecified object, initial encounter: Secondary | ICD-10-CM | POA: Diagnosis not present

## 2016-11-04 DIAGNOSIS — M25512 Pain in left shoulder: Secondary | ICD-10-CM | POA: Insufficient documentation

## 2016-11-04 DIAGNOSIS — S2242XA Multiple fractures of ribs, left side, initial encounter for closed fracture: Secondary | ICD-10-CM | POA: Insufficient documentation

## 2016-11-04 DIAGNOSIS — W19XXXA Unspecified fall, initial encounter: Secondary | ICD-10-CM

## 2016-11-04 LAB — CBC
HCT: 41.6 % (ref 39.0–52.0)
Hemoglobin: 14.4 g/dL (ref 13.0–17.0)
MCH: 31.2 pg (ref 26.0–34.0)
MCHC: 34.6 g/dL (ref 30.0–36.0)
MCV: 90.2 fL (ref 78.0–100.0)
PLATELETS: 185 10*3/uL (ref 150–400)
RBC: 4.61 MIL/uL (ref 4.22–5.81)
RDW: 13.5 % (ref 11.5–15.5)
WBC: 12.4 10*3/uL — AB (ref 4.0–10.5)

## 2016-11-04 MED ORDER — IOPAMIDOL (ISOVUE-300) INJECTION 61%
100.0000 mL | Freq: Once | INTRAVENOUS | Status: AC | PRN
  Administered 2016-11-04: 80 mL via INTRAVENOUS

## 2016-11-04 MED ORDER — ONDANSETRON HCL 4 MG/2ML IJ SOLN
4.0000 mg | Freq: Once | INTRAMUSCULAR | Status: AC
  Administered 2016-11-04: 4 mg via INTRAVENOUS
  Filled 2016-11-04: qty 2

## 2016-11-04 MED ORDER — HYDROCODONE-ACETAMINOPHEN 5-325 MG PO TABS: 1.0000 | ORAL_TABLET | Freq: Four times a day (QID) | ORAL | 0 refills | Status: DC | PRN

## 2016-11-04 MED ORDER — FENTANYL CITRATE (PF) 100 MCG/2ML IJ SOLN
25.0000 ug | Freq: Once | INTRAMUSCULAR | Status: AC
  Administered 2016-11-04: 25 ug via INTRAVENOUS
  Filled 2016-11-04: qty 2

## 2016-11-04 MED ORDER — SODIUM CHLORIDE 0.9 % IV SOLN: INTRAVENOUS | Status: DC

## 2016-11-04 MED ORDER — TRAMADOL HCL 50 MG PO TABS: 50.0000 mg | ORAL_TABLET | Freq: Four times a day (QID) | ORAL | 0 refills | Status: DC | PRN

## 2016-11-04 MED ORDER — SODIUM CHLORIDE 0.9 % IV BOLUS (SEPSIS)
250.0000 mL | Freq: Once | INTRAVENOUS | Status: AC
  Administered 2016-11-04: 250 mL via INTRAVENOUS

## 2016-11-04 NOTE — ED Triage Notes (Signed)
Patient states that he got cough up in the blankets and fell - he reports that his hit his left shoulder and left rib cage. The patient is a / ox 4  - denies any LOC or hitting his head

## 2016-11-04 NOTE — Discharge Instructions (Signed)
As we discussed CT and chest x-ray showed rib fractures on the left side ninth and 10th. Based on the CT no significant underlying lung injury. Return however for fevers increased shortness of breath or trouble breathing. Take pain medicine as directed. Expect significant pain left-sided chest for the next 2 weeks. May also have some pain even several months out. Use the incentive spirometer to help keep your lungs working well.

## 2016-11-04 NOTE — ED Provider Notes (Signed)
[facility] DEPT [facility] Provider Note   CSN: [id number] Arrival date & time: 11/04/16  1505     History   Chief Complaint Chief Complaint  Patient presents with  . Fall    HPI Ian Gibbs is a 76 y.o. male.  Patient status post fall at home around noon. Patient tripped landing on his left side on day edge of the piece of furniture. Patient with complaint of left lateral chest pain as well as left shoulder pain. No loss of consciousness. Patient not on blood thinners.      Past Medical History:  Diagnosis Date  . Dizziness   . GERD (gastroesophageal reflux disease)   . Hyperlipemia   . Neuromuscular disorder ([place])    alzeimer, and parkinson per wife.  . Neuropathy ([place])   . Ptosis 2013  . Sleep apnea   . Thyroid disease   . Weakness     Patient Active Problem List   Diagnosis Date Noted  . Orthostatic hypotension 01/29/2016  . GERD (gastroesophageal reflux disease) 12/04/2015  . Hypothyroidism 12/04/2015  . Mild cognitive impairment 11/20/2015  . Dizziness   . Obstructive sleep apnea 01/28/2014  . 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 Diplopia 12/12/2013  . Ptosis 12/12/2013  . Gait difficulty 12/12/2013  . Weakness 12/12/2013  . Hyperlipemia   . Neuropathy [facility])     Past Surgical History:  Procedure Laterality Date  . CYST REMOVAL HAND    . LARYNX SURGERY    . THROAT SURGERY    . TONSILLECTOMY AND ADENOIDECTOMY    . TOTAL KNEE ARTHROPLASTY     Right       Home Medications    Prior to Admission medications   Medication Sig Start Date End Date Taking? Authorizing Provider  carbidopa-levodopa (SINEMET IR) 25-100 MG tablet Take 1 tablet by mouth 3 (three) times daily. 07/26/16   Marcial Pacas, MD  gabapentin (NEURONTIN) 300 MG capsule Take 1 capsule (300 mg total) by mouth 3 (three) times daily. Please deliver to patient. 06/04/16   Percell Miller Saguier, PA-C  HYDROcodone-acetaminophen (NORCO/VICODIN) 5-325 MG tablet Take 1-2 tablets  by mouth every 6 (six) hours as needed for moderate pain. 11/04/16   Fredia Sorrow, MD  levothyroxine (SYNTHROID, LEVOTHROID) 75 MCG tablet Take 1 tablet (75 mcg total) by mouth daily before breakfast. 08/23/16   Mackie Pai, PA-C  Multiple Vitamins-Minerals (MULTIVITAMIN PO) Take by mouth daily.    Historical Provider, MD  Multiple Vitamins-Minerals (PRESERVISION AREDS PO) Take by mouth daily.    Historical Provider, MD  omeprazole (PRILOSEC) 20 MG capsule Take 1 capsule (20 mg total) by mouth daily. 05/17/16   Percell Miller Saguier, PA-C  simvastatin (ZOCOR) 20 MG tablet Take 0.5 tablets (10 mg total) by mouth every evening. 01/19/16   Mackie Pai, PA-C    Family History Family History  Problem Relation Age of Onset  . Seizures Mother   . Cancer - Other Father   . Cancer - Other Maternal Grandmother     Social History Social History  Substance Use Topics  . Smoking status: Never Smoker  . Smokeless tobacco: Never Used  . Alcohol use No     Allergies   Patient has no known allergies.   Review of Systems Review of Systems  Constitutional: Negative for fever.  HENT: Negative for congestion.   Eyes: Negative for visual disturbance.  Respiratory: Negative for shortness of breath.   Cardiovascular: Positive for chest pain.  Gastrointestinal: Negative for abdominal pain, nausea and vomiting.  Genitourinary: Negative for hematuria.  Musculoskeletal: Negative for back pain and neck pain.  Skin: Negative for wound.  Neurological: Negative for dizziness and syncope.  Hematological: Does not bruise/bleed easily.  Psychiatric/Behavioral: Negative for confusion.     Physical Exam Updated Vital Signs BP 152/78   Pulse 79   Temp 98.2 F (36.8 C) (Oral)   Resp 20   Ht 5\' 11"  (1.803 m)   Wt 84.4 kg   SpO2 98%   BMI 25.94 kg/m   Physical Exam  Constitutional: He is oriented to person, place, and time. He appears well-developed and well-nourished. No distress.  HENT:  Head:  Normocephalic and atraumatic.  Mouth/Throat: Oropharynx is clear and moist.  Eyes: EOM are normal. Pupils are equal, round, and reactive to light.  Neck: Normal range of motion. Neck supple.  Cardiovascular: Normal rate and regular rhythm.   Pulmonary/Chest: Effort normal and breath sounds normal. No respiratory distress. He exhibits tenderness.  Abdominal: Soft. Bowel sounds are normal. There is no tenderness.  Musculoskeletal: Normal range of motion. He exhibits no edema, tenderness or deformity.  Patient's left shoulder nontender good range of motion radial pulses 2+. Slight swelling above the left clavicle no clavicle deformity. Nontender.  Neurological: He is alert and oriented to person, place, and time. No cranial nerve deficit. He exhibits normal muscle tone. Coordination normal.  Skin: Skin is warm.  Nursing note and vitals reviewed.    ED Treatments / Results  Labs (all labs ordered are listed, but only abnormal results are displayed) Labs Reviewed  CBC - Abnormal; Notable for the following:       Result Value   WBC 12.4 (*)    All other components within normal limits    EKG  EKG Interpretation None       Radiology Dg Ribs Unilateral W/chest Left  Result Date: 11/04/2016 CLINICAL DATA:  Patient status post fall with left-sided anterior rib pain. Left shoulder pain. EXAM: LEFT RIBS AND CHEST - 3+ VIEW COMPARISON:  Chest radiograph 12/24/2015. FINDINGS: Patient is mildly rotated. Stable enlarged cardiac and mediastinal contours with tortuosity of the thoracic aorta. Small left pleural effusion and underlying opacities within the left hemithorax suggestive of atelectasis. Mildly displaced fractures of the left lateral ninth and tenth ribs. IMPRESSION: Mildly displaced left lateral ninth and tenth rib fractures. Small left pleural effusion and underlying opacities which may represent atelectasis. Electronically Signed   By: Lovey Newcomer M.D.   On: 11/04/2016 15:59   Ct  Chest W Contrast  Result Date: 11/04/2016 CLINICAL DATA:  Fall with left rib fractures and pleural effusion. EXAM: CT CHEST WITH CONTRAST TECHNIQUE: Multidetector CT imaging of the chest was performed during intravenous contrast administration. CONTRAST:  78mL ISOVUE-300 IOPAMIDOL (ISOVUE-300) INJECTION 61% COMPARISON:  01/01/2014 FINDINGS: Cardiovascular: Heart is enlarged. Coronary artery calcification is noted. Mediastinum/Nodes: No mediastinal lymphadenopathy. There is no hilar lymphadenopathy. The esophagus has normal imaging features. There is no axillary lymphadenopathy. Lungs/Pleura: Dependent atelectasis noted in the lower lobes bilaterally. No evidence of pneumothorax. Tiny left pleural effusion is evident. Upper Abdomen: Imaging was carried considerably down into the abdomen. No focal abnormality within the liver parenchyma. There is no evidence for gallstones, gallbladder wall thickening, or pericholecystic fluid. No intrahepatic or extrahepatic biliary dilation. No focal mass lesion. No dilatation of the main duct. No intraparenchymal cyst. No peripancreatic edema. No splenomegaly. No focal mass lesion. No adrenal nodule or mass. Right kidney unremarkable. 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 Musculoskeletal: Acute fractures of the posterolateral left ninth and tenth ribs are evident. IMPRESSION: 1. Left posterolateral ninth and tenth rib fractures. 2. Tiny left pleural effusion. 3. Compressive atelectasis in the lung bases bilaterally. No pneumothorax. Electronically Signed   By: Misty Stanley M.D.   On: 11/04/2016 18:36   Dg Shoulder Left  Result Date: 11/04/2016 CLINICAL DATA:  Fall. EXAM: LEFT SHOULDER - 2+ VIEW COMPARISON:  No recent prior . FINDINGS: No acute bony or joint abnormality identified. No evidence of fracture or dislocation. Downsloping acromion. Acromioclavicular and glenohumeral degenerative change. IMPRESSION: No acute or focal  abnormality. Downsloping acromion. Acromioclavicular glenohumeral degenerative change. Electronically Signed   By: Marcello Moores  Register   On: 11/04/2016 15:56    Procedures Procedures (including critical care time)  Medications Ordered in ED Medications  0.9 %  sodium chloride infusion (not administered)  sodium chloride 0.9 % bolus 250 mL (250 mLs Intravenous New Bag/Given 11/04/16 1742)  ondansetron (ZOFRAN) injection 4 mg (4 mg Intravenous Given 11/04/16 1743)  fentaNYL (SUBLIMAZE) injection 25 mcg (25 mcg Intravenous Given 11/04/16 1745)  iopamidol (ISOVUE-300) 61 % injection 100 mL (80 mLs Intravenous Contrast Given 11/04/16 1809)     Initial Impression / Assessment and Plan / ED Course  I have reviewed the triage vital signs and the nursing notes.  Pertinent labs & imaging results that were available during my care of the patient were reviewed by me and considered in my medical decision making (see chart for details).    Patient with fall no loss of consciousness. Landed on left side on the corner piece of furniture. Patient tripped. Complaint of left shoulder pain and left lateral chest pain. X-ray of the left shoulder without any acute abnormalities and patient has good range of motion at the shoulder. Chest x-ray showed ninth and 10th ribs and showed some fluid on the long cyst CT chest was done which shows no pneumothorax no significant pleural effusion very tiny. Still shows just the 2 rib fractures. Patient be treated with incentive spirometer 2 different types of pain medicine hydrocodone as needed for more severe pain and tramadol as needed for lighter pain. Patient given precautions and told to return for any fevers or difficulty breathing.   Final Clinical Impressions(s) / ED Diagnoses   Final diagnoses:  Fall, initial encounter  Closed fracture of multiple ribs of left side, initial encounter    New Prescriptions New Prescriptions   HYDROCODONE-ACETAMINOPHEN (NORCO/VICODIN)  5-325 MG TABLET    Take 1-2 tablets by mouth every 6 (six) hours as needed for moderate pain.     Fredia Sorrow, MD 11/04/16 [phone]

## 2016-11-11 ENCOUNTER — Ambulatory Visit (HOSPITAL_BASED_OUTPATIENT_CLINIC_OR_DEPARTMENT_OTHER)
Admission: RE | Admit: 2016-11-11 | Discharge: 2016-11-11 | Disposition: A | Discharge status: Home / Self Care | Payer: Medicare Other | Source: Ambulatory Visit | Attending: Medical | Admitting: Medical

## 2016-11-11 ENCOUNTER — Ambulatory Visit (INDEPENDENT_AMBULATORY_CARE_PROVIDER_SITE_OTHER): Payer: Medicare Other | Admitting: Medical

## 2016-11-11 VITALS — BP 147/73 | HR 89 | Temp 98.4°F | Wt 185.2 lb

## 2016-11-11 DIAGNOSIS — M898X1 Other specified disorders of bone, shoulder: Secondary | ICD-10-CM

## 2016-11-11 DIAGNOSIS — S2242XA Multiple fractures of ribs, left side, initial encounter for closed fracture: Secondary | ICD-10-CM

## 2016-11-11 DIAGNOSIS — M25512 Pain in left shoulder: Secondary | ICD-10-CM

## 2016-11-11 MED ORDER — GABAPENTIN 300 MG PO CAPS: 300.0000 mg | ORAL_CAPSULE | Freq: Three times a day (TID) | ORAL | 2 refills | Status: DC

## 2016-11-11 MED ORDER — SIMVASTATIN 20 MG PO TABS: 10.0000 mg | ORAL_TABLET | Freq: Every evening | ORAL | 3 refills | Status: DC

## 2016-11-11 MED ORDER — TRAMADOL HCL 50 MG PO TABS: 50.0000 mg | ORAL_TABLET | Freq: Four times a day (QID) | ORAL | 0 refills | Status: DC | PRN

## 2016-11-11 NOTE — Patient Instructions (Addendum)
For your recent rib fractures(and shoulder/scapula pain) will refill your tramadol rx. Will give 20 tabs and will refill as needed. Rib fracture can take 6-8 weeks to heal. So will provide refills up to around that time. If pain were to exceed this time frame then would need to place on controlled med contract.   Follow up in 3 months for regular check up or as needed

## 2016-11-11 NOTE — Progress Notes (Signed)
Subjective:    Patient ID: Ian Gibbs, male    DOB: [birth year], 76 y.o.   MRN: [id number]  HPI   Pt in for follow up for fall. His feet got stuck in corner of bed spread and hit table. Pt does not use his walker.   Pt had 9 th and 10 th rib fracture. Not complaining of too much pain. He is on currently on tramadol now. Hydrocodone caused altered mental status.  Pt is about to be out of tramadol. Maybe 2-3 tabs left.    Some occasional posterior left shoulder pain and scapula area pain since fall with certain movements. Pain sharp and high level but transient.     Review of Systems  Constitutional: Negative for chills, fatigue and fever.  Respiratory: Negative for cough, chest tightness, shortness of breath and wheezing.        Doing breahting exercises to prevent pneumonia.  Cardiovascular: Negative for chest pain and palpitations.  Musculoskeletal: Negative for back pain.       Left rib pain. Shoulder and scapula pain.  Skin: Negative for rash.  Neurological: Negative for dizziness, speech difficulty, weakness, light-headedness and numbness.  Hematological: Negative for adenopathy. Does not bruise/bleed easily.  Psychiatric/Behavioral: Negative for behavioral problems, confusion and suicidal ideas. The patient is not nervous/anxious.     Past Medical History:  Diagnosis Date  . Dizziness   . GERD (gastroesophageal reflux disease)   . Hyperlipemia   . Neuromuscular disorder ([place])    alzeimer, and parkinson per wife.  . Neuropathy ([place])   . Ptosis 2013  . Sleep apnea   . Thyroid disease   . Weakness      Social History   Social History  . Marital status: Married    Spouse name: [name]  . Number of children: 1  . Years of education: masters   Occupational History  .      Retired - Theme park manager   Social History Main Topics  . Smoking status: Never Smoker  . Smokeless tobacco: Never Used  . Alcohol use No  . Drug use: No  . Sexual activity: Not on file    Other Topics Concern  . 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 CYST REMOVAL HAND    . LARYNX SURGERY    . THROAT SURGERY    . TONSILLECTOMY AND ADENOIDECTOMY    . TOTAL KNEE ARTHROPLASTY     Right    Family History  Problem Relation Age of Onset  . Seizures Mother   . Cancer - Other Father   . Cancer - Other Maternal Grandmother     No Known Allergies  Current Outpatient Prescriptions on File Prior to Visit  Medication Sig Dispense Refill  . carbidopa-levodopa (SINEMET IR) 25-100 MG tablet Take 1 tablet by mouth 3 (three) times daily. 90 tablet 11  . HYDROcodone-acetaminophen (NORCO/VICODIN) 5-325 MG tablet Take 1-2 tablets by mouth every 6 (six) hours as needed for moderate pain. 20 tablet 0  . levothyroxine (SYNTHROID, LEVOTHROID) 75 MCG tablet Take 1 tablet (75 mcg total) by mouth daily before breakfast. 30 tablet 3  . Multiple Vitamins-Minerals (MULTIVITAMIN PO) Take by mouth daily.    . Multiple Vitamins-Minerals (  PRESERVISION AREDS PO) Take by mouth daily.    Marland Kitchen omeprazole (PRILOSEC) 20 MG capsule Take 1 capsule (20 mg total) by mouth daily. 30 capsule 5   No current facility-administered medications on file prior to visit.     BP (!) 147/73 (BP Location: Right Arm, Patient Position: Sitting, Cuff Size: Normal)   Pulse 89   Temp 98.4 F (36.9 C) (Oral)   Wt 185 lb 3 oz (84 kg)   SpO2 97%   BMI 25.83 kg/m      Objective:   Physical Exam  General- No acute distress. Pleasant patient. Neck- Full range of motion, no jvd Lungs- Clear, even and unlabored. Heart- regular rate and rhythm. Neurologic- CNII- XII grossly intact.  Ribs- on exam mild pain on palpation left ribs. Lt shoulder- good rom but mild pain on palpation posterior aspect and superior  portion of scapula left side.      Assessment & Plan:  For your recent rib fractures(and shoulder/scapula pain) will refill your tramadol rx. Will give 20 tabs and will refill as needed. Rib fracture can take 6-8 weeks to heal. So will provide refills up to around that time. If pain were to exceed this time frame then would need to place on controlled med contract.   Follow up in 3 months for regular check up or as needed  Note pt tolerates tramadol much better than norco. Norco made him very confused per family. So they have dc'd hydrocodone.  Jolyssa Oplinger, Percell Miller, PA-C

## 2016-11-11 NOTE — Progress Notes (Signed)
Pre visit review using our clinic review tool, if applicable. No additional management support is needed unless otherwise documented below in the visit note. 

## 2016-11-24 DIAGNOSIS — M25512 Pain in left shoulder: Secondary | ICD-10-CM | POA: Diagnosis not present

## 2016-12-02 ENCOUNTER — Ambulatory Visit: Payer: Medicare Other | Admitting: Nurse Practitioner

## 2016-12-10 ENCOUNTER — Telehealth: Payer: Self-pay | Admitting: *Deleted

## 2016-12-10 MED ORDER — OMEPRAZOLE 20 MG PO CPDR: 20.0000 mg | DELAYED_RELEASE_CAPSULE | Freq: Every day | ORAL | 5 refills | Status: DC

## 2016-12-10 NOTE — Telephone Encounter (Signed)
Faxed refill request received from [place] for Omeprazole Last filled by MD on 05/17/16, #30x5 Last AEX - 11/11/16 Refill sent per [facility] refill protocol/SLS

## 2017-01-04 ENCOUNTER — Telehealth: Payer: Self-pay | Admitting: *Deleted

## 2017-01-04 MED ORDER — GABAPENTIN 300 MG PO CAPS
300.0000 mg | ORAL_CAPSULE | Freq: Three times a day (TID) | ORAL | 1 refills | Status: DC
Start: 2017-01-04 — End: 2018-03-16

## 2017-01-04 MED ORDER — LEVOTHYROXINE SODIUM 75 MCG PO TABS: 75.0000 ug | ORAL_TABLET | Freq: Every day | ORAL | 0 refills | Status: AC

## 2017-01-04 NOTE — Telephone Encounter (Signed)
Faxed refill request received from [place] for Gabapentin 300 mg Last filled by MD on 12/01/2016 Request fro 90-day supply, D/C PREVIOUS SCRIPTS FOR THIS MEDICATION Refill sent per [facility] refill protocol/SLS

## 2017-01-04 NOTE — Addendum Note (Signed)
Addended by: Rockwell Germany on: 01/04/2017 01:32 PM   Modules accepted: Orders

## 2017-01-24 ENCOUNTER — Ambulatory Visit (INDEPENDENT_AMBULATORY_CARE_PROVIDER_SITE_OTHER): Payer: Medicare Other | Admitting: Otolaryngology

## 2017-02-03 DIAGNOSIS — B351 Tinea unguium: Secondary | ICD-10-CM | POA: Diagnosis not present

## 2017-02-03 DIAGNOSIS — M79674 Pain in right toe(s): Secondary | ICD-10-CM | POA: Diagnosis not present

## 2017-02-03 DIAGNOSIS — M79675 Pain in left toe(s): Secondary | ICD-10-CM | POA: Diagnosis not present

## 2017-02-03 DIAGNOSIS — G629 Polyneuropathy, unspecified: Secondary | ICD-10-CM | POA: Diagnosis not present

## 2017-02-07 DIAGNOSIS — N400 Enlarged prostate without lower urinary tract symptoms: Secondary | ICD-10-CM | POA: Diagnosis not present

## 2017-02-07 DIAGNOSIS — R972 Elevated prostate specific antigen [PSA]: Secondary | ICD-10-CM | POA: Diagnosis not present

## 2017-02-07 DIAGNOSIS — N529 Male erectile dysfunction, unspecified: Secondary | ICD-10-CM | POA: Diagnosis not present

## 2017-02-09 ENCOUNTER — Encounter: Payer: Self-pay | Admitting: Neurology

## 2017-02-09 ENCOUNTER — Telehealth: Payer: Self-pay | Admitting: Neurology

## 2017-02-09 ENCOUNTER — Ambulatory Visit (INDEPENDENT_AMBULATORY_CARE_PROVIDER_SITE_OTHER): Payer: Medicare Other | Admitting: Neurology

## 2017-02-09 VITALS — BP 120/61 | HR 88 | Ht 71.0 in | Wt 182.0 lb

## 2017-02-09 DIAGNOSIS — F0390 Unspecified dementia without behavioral disturbance: Secondary | ICD-10-CM | POA: Diagnosis not present

## 2017-02-09 DIAGNOSIS — I951 Orthostatic hypotension: Secondary | ICD-10-CM

## 2017-02-09 DIAGNOSIS — R269 Unspecified abnormalities of gait and mobility: Secondary | ICD-10-CM

## 2017-02-09 DIAGNOSIS — R531 Weakness: Secondary | ICD-10-CM

## 2017-02-09 MED ORDER — DONEPEZIL HCL 10 MG PO TABS: 10.0000 mg | ORAL_TABLET | Freq: Every day | ORAL | 11 refills | Status: AC

## 2017-02-09 NOTE — Progress Notes (Addendum)
Chief Complaint  Patient presents with  . Memory Loss    MMSE 22/30 - 9 animals.  He is here with his wife, [name]. He has been more confused over the last two weeks, especially at night.  He is also having bladder and bowel incontinence during the night (he is now wearing Depends while sleeping).  Reports increased falls (broke two ribs in February, hit head in April).  Last night, he became so confused that he removed his Depends and used the bathroom in his living room floor.  His voice is also hoarse.     [facility]  PATIENT: Ian Gibbs DOB: 04-07-1941   REASON FOR VISIT: Follow-up for diplopia, dizziness, history of obstructive sleep apnea   HISTORY OF PRESENT ILLNESS: Ian Gibbs 76 yo RH WM with wife, is referred by his primary care physician Dr. Morrie Sheldon for evaluation of peripheral neuropathy, ptosis, blurry vision, dizziness, generalized weakness, Initial visit, March 2015.   He had past medical history of hypothyroidism, hyperlipidemia  [place], he developed gradual onset difficulty talking, his voice varies from high pitch to low, he also has mild dysphagia, [place], was treated at the [facility] by ENT, was diagnosed with bilateral vocal cord paralysis of unknown etiology, had surgery, which helped his symptoms some In 2012, he noticed bilateral feet discomfort, numbness tingling, burning discomfort, getting worse after bearing weight, gradually getting worse, he was evaluated by [facility] Dr. Ellender Hose, reported abnormal EMG nerve conduction study consistent with peripheral neuropathy, notes also reported extensive laboratory evaluation, including vitamin B1, IFEP was normal, Over the years, his symptoms has been controlled with titrating dose of gabapentin, currently he is taking 600 mg 3 times a day.  In 2013, he also developed generalized weakness, intermittent ptosis,  occasionally gait difficulty, getting worse over past 1 year, over past 6 months, he noticed worsening ptosis, also intermittent double vision, especially with prolonged reading, speech is slurred, mild swallowing difficulties, gait difficulty, he also complains of dizziness, lightheadedness after prolonged standing,  MRI of the brain March 2015, showed moderate perisylvian fissure atrophy, mild to moderate small vessel disease, no acute lesions.  Laboratory showed normal or Negative RPR, B12, C. reactive protein, folic acid, TSH, ESR, ANA, acetylcholine receptor antibody, EMG nerve conduction study March 2015,  showed length dependent mild axonal peripheral neuropathy, repetitive stimulation of right accessory nerve, recording at right upper trapezius showed no abnormalities.  He continue complains of fatigue, lack of stamina, with prolonged walking, he complains of low back pain, bilateral calf muscle tightness, achiness, he is taking Mestinon 60 mg 3 times a day, tolerating it well, but there was no significant improvement  MRI of the lumbar, only mild degenerative disc disease, there is no significant canal, of foraminal stenosis, Athena diagnostic testing showed negative musk antibody, borderline antibody to acetylcholine receptor.  CT of the chest showed mild cardiomegaly, no acute lesions, no thymus pathology noticed.  He also complains of daytime sleepiness, snoring, ESS score is 12, he tends to sleep while sitting down reading, watching TV, even sitting inactive in public places, lying down rest in the afternoon, after lunch, FSS score is 47, he also complains of dry mouth, frequent awakening at night time, he had sleep study, was put on CPAP machine,  UPDATE Jul 01 2014: He continues to get weaker, he complains of dizziness when getting up, lightheaded sensation, no spinning around, he has unsteady gait,  rely on his cane, mild short-term memory trouble.  His maternal grandmother, mother  had Alzheimer's disease.  I have reviewed the [facility] neuromuscular consultation in August 2015, and single-fiber EMG by Dr. Vallarie Mare, the conclusion was normal study. The main jitter was 33.2m, less than the normal range of 41, only one pair was abnormal at 106, and that particular repair showed blocking, 2 other pairs approached the upper limits of normal, and did not show blocking.  UPDATE March 22nd 2016: Right knee replacement in Oct 16th 2015, he recovered very well, no significant right knee pain, but he continue have gait difficulty, fell twice since, in addition, since January 2016, he developed worsening bowel and bladder incontinence,   Last visit was with CHoyle Sauerin November 2015, he was diagnosed with obstructive sleep apnea and uses CPAP. Using CPAP machine, overall much improved quality of sleep,  He complains of dizziness, fussy, at different times, mostly happened when he stands, blurry vision, lasting for a few minutes, worsening memory trouble, today's Mini-Mental status examination is 28 out of 30, animal naming is 142 UPDATE April 28th 2016: He complains of feeling all the time, increased balance difficulty, rely on his cane, especially in past 2 weeks,he has no incontinence, he also complains of numbness at his feet, lasting for few minutes, vision is blurred, difficulty to read, even with new glasses prescirptions, he has lost sense of smell, he now sleeps well with CPAP  We have reviewed MRI of cervical spine, multilevel degenerative changes, foraminal stenosis at different levels, but no significant canal stenosis  UPDATE May 15 2015: He is with his wife, complains of dizziness when standing up quickly, there was no orthostatic blood pressure changes today, On further questioning, his complains of dizziness is unsteady gait, he has gradually declined functional status, has become more sedentary, unsure on his feet He continue blurry vision, could no longer read  much   UPDATE Feb 9th 2017: He continues to decline slowly, he has more slow walking, mildly increased gait difficulty, he denies significant pain, he denies incontinence, does has urinary urgency, he has no dysphagia, no chewing difficulty,  No limb wekaness, he sleeps well now with CPAP machine,  He has mild memory trouble, he enjoys reading, but has trouble keeping up sometimes, got confused about his driving direction occasionally   He has strong family history of dementia, his maternal grandmother, mother also suffered Alzheimer's disease.  UPDATE January 29 2016: Since he stopped Namenda in March 2017, his dizziness has improved, he still has positional related, dizziness, lightheadness when standing up quickly or with prolonged, walking.  He is taking Sinemet 25/100 tid, not sure about the benefit,   Wife reported one episode of fever, not feeling well, and then transient loss of consciousness, went limb, staring into the space lasting for a few minutes in March 2017, per wife, the blood pressure was elevated 150 during event,  The most bothersome symptoms for patient is intermittent dizziness especially with standing up quickly or with prolonged walking, continued mild worsening gait difficulty, bilateral feet paresthesia  UPDATE Oct 16th 2017: He continued to complains of mild memory loss, intermittent double vision, gait abnormality, orthostatic dizziness 50% of the time, he is able to tolerate Sinemet 25/100 mg, 3 times a day, which did help his walking some,  UPDATE Feb 09 2017: He is with his wife,  he noticed worsening memory loss, gait abnormality," as if everything is out of control"he has quit driving,  He  fell in January 2018 with rib fracture, fell again in early April 2018 backwards and landed on his occipital region at the concrete wall, he did not loss consciousness, but since then, he was noted to have slower speech, worsening gait abnormality, evening time confusion  worsening memory loss, began to have night time bowel and bladder incontinence,  Last night Feb 08 2017, he slept in his recliner, he described difficulty getting out of his recliner as if he is in a truck, eventually he was able to get up, took off his depends, urinate in the living room,  He no longer has frequent dizziness,  We have personally reviewed MRI of the brain in 2015, moderate atrophy, supratentorium small vessel disease,   REVIEW OF SYSTEMS: Full 14 system review of systems performed and notable only for those listed, all others are neg: achy muscles, walking difficulty, facial drooping, confusion, decreased concentration, speech difficulty, memory loss dizziness, numbness, chest tightness, hearing loss, ringing ears   ALLERGIES: No Known Allergies  HOME MEDICATIONS: Outpatient Medications Prior to Visit  Medication Sig Dispense Refill  . carbidopa-levodopa (SINEMET IR) 25-100 MG tablet Take 1 tablet by mouth 3 (three) times daily. 90 tablet 11  . gabapentin (NEURONTIN) 300 MG capsule Take 1 capsule (300 mg total) by mouth 3 (three) times daily. Please deliver to patient. 270 capsule 1  . levothyroxine (SYNTHROID, LEVOTHROID) 75 MCG tablet Take 1 tablet (75 mcg total) by mouth daily before breakfast. 90 tablet 0  . Multiple Vitamins-Minerals (MULTIVITAMIN PO) Take by mouth daily.    . Multiple Vitamins-Minerals (PRESERVISION AREDS PO) Take by mouth daily.    Marland Kitchen omeprazole (PRILOSEC) 20 MG capsule Take 1 capsule (20 mg total) by mouth daily. 30 capsule 5  . simvastatin (ZOCOR) 20 MG tablet Take 0.5 tablets (10 mg total) by mouth every evening. 30 tablet 3  . traMADol (ULTRAM) 50 MG tablet Take 1 tablet (50 mg total) by mouth every 6 (six) hours as needed. 20 tablet 0   No facility-administered medications prior to visit.     PAST MEDICAL HISTORY: Past Medical History:  Diagnosis Date  . Dizziness   . GERD (gastroesophageal reflux disease)   . Hyperlipemia   .  Neuromuscular disorder ([place])    alzeimer, and parkinson per wife.  . Neuropathy   . Ptosis 2013  . Sleep apnea   . Thyroid disease   . Weakness     PAST SURGICAL HISTORY: Past Surgical History:  Procedure Laterality Date  . CYST REMOVAL HAND    . LARYNX SURGERY    . THROAT SURGERY    . TONSILLECTOMY AND ADENOIDECTOMY    . TOTAL KNEE ARTHROPLASTY     Right    FAMILY HISTORY: Family History  Problem Relation Age of Onset  . Seizures Mother   . Cancer - Other Father   . Cancer - Other Maternal Grandmother     SOCIAL HISTORY: Social History   Social History  . Marital status: Married    Spouse name: [name]  . Number of children: 1  . Years of education: masters   Occupational History  .      Retired - Theme park manager   Social History Main Topics  . Smoking status: Never Smoker  . Smokeless tobacco: Never Used  . Alcohol use No  . Drug use: No  . Sexual activity: Not on file   Other Topics Concern  . 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 PHYSICAL EXAMNIATION:  Gen: NAD, conversant, well nourised, obese, well groomed                     Cardiovascular: Regular rate rhythm, no peripheral edema, warm, nontender. Eyes: Conjunctivae clear without exudates or hemorrhage Neck: Supple, no carotid bruise. Pulmonary: Clear to auscultation bilaterally   NEUROLOGICAL EXAM:  MENTAL STATUS: MMSE - Mini Mental State Exam 02/09/2017 07/26/2016 01/29/2016  Orientation to time _1 Orientation to Place _2 Registration _3 Attention/ Calculation _4 Recall _5 Language- name 2 objects _6 Language- repeat _7 Language- follow 3 step command _8 Language- read & follow direction _9 Write a sentence _10 Copy design _11 Total score _12 CRANIAL NERVES: CN II: Visual fields are full to confrontation. Fundoscopic exam is normal with sharp discs and no vascular changes. Venous pulsations are present bilaterally. Pupils are 4 mm and briskly reactive to light. Visual acuity is 20/20 bilaterally. CN III, IV, VI: extraocular movement are normal. Left ptosis, CN V: Facial sensation is intact to pinprick in all 3 divisions bilaterally. Corneal responses are intact.  CN VII: Face is symmetric, mild bilateral eye-closure, cheek puff weakness, he has static left ptosis  CN VIII: Hearing is normal to rubbing fingers CN IX, X: Palate elevates symmetrically. Phonation is normal. CN XI: Head turning and shoulder shrug are intact CN XII: Tongue is midline with normal movements and no atrophy.  MOTOR: Normal muscle bulk, and strength, he has mild limb and nuchal rigidity,  REFLEXES: Reflexes are hypoactive and symmetric. Plantar responses are flexor.  SENSORY: He has length dependent decreased light touch pinprick to above ankle level, absent vibratory sensation to ankle level.  COORDINATION: Rapid alternating movements and fine finger movements are intact. There is no dysmetria on finger-to-nose and heel-knee-shin. There are no abnormal or extraneous movements.   GAIT/STANCE: Stoop forward, bilateral valgrus knee, cautious, mildly unsteady, decreased bilateral arm swing, tends to lean his body towards the right side  DIAGNOSTIC DATA (LABS, IMAGING, TESTING) - I reviewed patient records, labs, notes, testing and imaging myself where available. MRI brain March 2015:  moderate perisylvian fissure atrophy, mild to moderate small vessel disease, no acute lesions.  MRI cervical spines April 2016: C5-6: severe biforaminal stenosis and mild spinal stenosis; no cord signal abnormalities. C4-5: moderate biforaminal stenosis. At  C3-4: moderate right and mild left foraminal stenosis. At C6-7: moderate left foraminal stenosis.  MRI lumbar 2015:  L3-4: facet hypertrophy with no spinal stenosis or foraminal narrowing. 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   Today Mini-Mental Status Examination is 22 out of 30  Have personally reviewed MRI  of the brain without contrast in 2015, moderate generalized atrophy, moderate supratentorium small vessel disease,  he could not tolerateda Namenda 10 mg twice a day due to increased dizziness  Laboratory reviewed, normal TSH, B12 previously, no treatable etiology identified,  Add on Aricept 10 mg daily  Sudden worsening since he fell in early April 2018, proceed with MRI of the brain without contrast   Diplopia  Extensive evaluations, no evidence of neuromuscular junctional disorder, acetylcholine receptor antibody was negative, Athena diagnostics testing showed borderline acetylcholine receptor binding antibody in the past,  Consider repeat acetylcholine receptor binding antibody  Obstructive sleep apnea  Encourage him continue  CPAP machine,  Peripheral neuropathy   Continue gabapentin 300 mg 3 times a day,   Worsening Gait difficulty  Multifactorial, includes peripheral neuropathy,valgrus knee, deconditioning there was no evidence of orthostatic blood pressure changes, significant brain atrophy  His orthostatic blood pressure change is also related to his peripheral neuropathy  I have advised him keep well hydration  Physical therapy  May consider taper him off Sinemet 25/100 to see if it has helped him    Marcial Pacas, M.D. Ph.D.  [facility] [place], [place]  [address] Phone: [phone] Fax:      [phone]

## 2017-02-09 NOTE — Telephone Encounter (Signed)
Pt wife is asking for a call to discuss behavior of pt.

## 2017-02-09 NOTE — Telephone Encounter (Addendum)
Spoke to patient's wife - patient has been more confused over the last two weeks, especially at night.  He is also having bladder and bowel incontinence during the night (he is now wearing Depends while sleeping).  Reports increased falls (broke two ribs in February, hit head in April).  Last night, he became so confused that he removed his Depends and used the bathroom in his living room floor.  He has been worked into Dr. Rhea Belton schedule this afternoon.

## 2017-02-11 ENCOUNTER — Telehealth: Payer: Self-pay | Admitting: Neurology

## 2017-02-11 NOTE — Telephone Encounter (Signed)
Spoke to patient's wife and she would like the following changes to be made to his referrals:  1) She would like his MRI scheduled at [facility] on [address] (close to their home). 2) She would like his PT to be handled by [facility]

## 2017-02-11 NOTE — Telephone Encounter (Signed)
Pt wife calling re: MRI and Physical Therapy asking to be called

## 2017-02-15 ENCOUNTER — Telehealth: Payer: Self-pay | Admitting: Neurology

## 2017-02-15 NOTE — Telephone Encounter (Signed)
I called [facility] on [address]. And they will call the patient to schedule his MRI.

## 2017-02-15 NOTE — Telephone Encounter (Signed)
Patient called our office on 02/11/17 and requested PT referral be sent to [facility]. Ian Gibbs has been informed.

## 2017-02-15 NOTE — Telephone Encounter (Signed)
Sean(physical therapist ) from [facility] [phone] said that he reached out twice and wife has not stuck to appointments, pt wife last told him the Dr said there was another direction being taken re: pt.  Hilliard Clark said he will not reach out (beyond he 2 times he has tried)

## 2017-02-15 NOTE — Telephone Encounter (Signed)
Processing is [place] started with [facility] . I have left Dian Situ a message with Dian Situ with [employer].

## 2017-02-16 DIAGNOSIS — R269 Unspecified abnormalities of gait and mobility: Secondary | ICD-10-CM | POA: Diagnosis not present

## 2017-02-16 DIAGNOSIS — F0390 Unspecified dementia without behavioral disturbance: Secondary | ICD-10-CM | POA: Diagnosis not present

## 2017-02-19 ENCOUNTER — Ambulatory Visit (HOSPITAL_BASED_OUTPATIENT_CLINIC_OR_DEPARTMENT_OTHER): Payer: Medicare Other

## 2017-02-21 DIAGNOSIS — F0390 Unspecified dementia without behavioral disturbance: Secondary | ICD-10-CM | POA: Diagnosis not present

## 2017-02-21 DIAGNOSIS — R269 Unspecified abnormalities of gait and mobility: Secondary | ICD-10-CM | POA: Diagnosis not present

## 2017-02-23 DIAGNOSIS — R269 Unspecified abnormalities of gait and mobility: Secondary | ICD-10-CM | POA: Diagnosis not present

## 2017-02-23 DIAGNOSIS — F0390 Unspecified dementia without behavioral disturbance: Secondary | ICD-10-CM | POA: Diagnosis not present

## 2017-02-24 ENCOUNTER — Other Ambulatory Visit: Payer: Self-pay | Admitting: Neurology

## 2017-02-24 ENCOUNTER — Ambulatory Visit (HOSPITAL_COMMUNITY)
Admission: RE | Admit: 2017-02-24 | Discharge: 2017-02-24 | Disposition: A | Discharge status: Home / Self Care | Payer: Medicare Other | Source: Ambulatory Visit | Attending: Neurology | Admitting: Neurology

## 2017-02-24 DIAGNOSIS — F0390 Unspecified dementia without behavioral disturbance: Secondary | ICD-10-CM | POA: Diagnosis not present

## 2017-02-24 DIAGNOSIS — R269 Unspecified abnormalities of gait and mobility: Secondary | ICD-10-CM | POA: Diagnosis not present

## 2017-02-24 DIAGNOSIS — I951 Orthostatic hypotension: Secondary | ICD-10-CM | POA: Insufficient documentation

## 2017-02-24 DIAGNOSIS — I6782 Cerebral ischemia: Secondary | ICD-10-CM | POA: Insufficient documentation

## 2017-02-24 DIAGNOSIS — R531 Weakness: Secondary | ICD-10-CM | POA: Diagnosis not present

## 2017-02-24 DIAGNOSIS — G319 Degenerative disease of nervous system, unspecified: Secondary | ICD-10-CM | POA: Insufficient documentation

## 2017-02-25 DIAGNOSIS — F0390 Unspecified dementia without behavioral disturbance: Secondary | ICD-10-CM | POA: Diagnosis not present

## 2017-02-25 DIAGNOSIS — R269 Unspecified abnormalities of gait and mobility: Secondary | ICD-10-CM | POA: Diagnosis not present

## 2017-02-28 DIAGNOSIS — R269 Unspecified abnormalities of gait and mobility: Secondary | ICD-10-CM | POA: Diagnosis not present

## 2017-02-28 DIAGNOSIS — F0390 Unspecified dementia without behavioral disturbance: Secondary | ICD-10-CM | POA: Diagnosis not present

## 2017-03-01 ENCOUNTER — Telehealth: Payer: Self-pay | Admitting: Neurology

## 2017-03-01 DIAGNOSIS — R269 Unspecified abnormalities of gait and mobility: Secondary | ICD-10-CM | POA: Diagnosis not present

## 2017-03-01 DIAGNOSIS — F0390 Unspecified dementia without behavioral disturbance: Secondary | ICD-10-CM | POA: Diagnosis not present

## 2017-03-01 NOTE — Telephone Encounter (Signed)
[name] patients wife called office in reference to requesting MRI results.  Please call

## 2017-03-01 NOTE — Telephone Encounter (Signed)
Ian Gibbs with [employer] called office in reference to patient wife requesting a medical social worker thru [place].  Ian Gibbs would like to know if this is okay and if she can get a verbal okay.  Please call per Ian Gibbs if she does not pick up the phone call she has a Engineer, production and you are able to leave detailed message of decision.

## 2017-03-01 NOTE — Telephone Encounter (Signed)
Spoke to [facility] - she has placed order for a Education officer, museum to come to his home and will send over orders for Dr. Rhea Belton signature.

## 2017-03-02 DIAGNOSIS — R269 Unspecified abnormalities of gait and mobility: Secondary | ICD-10-CM | POA: Diagnosis not present

## 2017-03-02 DIAGNOSIS — F0390 Unspecified dementia without behavioral disturbance: Secondary | ICD-10-CM | POA: Diagnosis not present

## 2017-03-02 NOTE — Telephone Encounter (Signed)
Please call patient MRI of the brain on Feb 24 2017 showed no acute abnormality, moderate to severe atrophy, small vessel disease, old basal ganglion lacunar infarction, I will review MRI film at his next follow-up visit  IMPRESSION: No acute intracranial process.  Stable examination including moderate to severe atrophy, moderate to severe chronic small vessel ischemic disease and old basal ganglia lacunar infarcts.

## 2017-03-02 NOTE — Telephone Encounter (Signed)
Patient's wife is aware of results.  She had been giving him a stool softener along with his Aricept.  He has been experiencing diarrhea.  She stopped the stool softener and spaced out the Aricept to every other day.  His diarrhea has now improved so she will try the Aricept 10mg  again on a daily basis.  She also feels his walking has worsened since stopping Sinemet.  Dr. Krista Blue has reviewed his chart.  She has instructed to restart his Sinemet 25-100mg , TID and to keep him well hydrated to see if this will improve his symptoms.  Pt's wife agreeable.  His follow up appt has also been moved to an earlier date.  She will bring their daughter with them to his next appt.

## 2017-03-03 DIAGNOSIS — H02402 Unspecified ptosis of left eyelid: Secondary | ICD-10-CM | POA: Diagnosis not present

## 2017-03-03 DIAGNOSIS — H353131 Nonexudative age-related macular degeneration, bilateral, early dry stage: Secondary | ICD-10-CM | POA: Diagnosis not present

## 2017-03-03 DIAGNOSIS — H524 Presbyopia: Secondary | ICD-10-CM | POA: Diagnosis not present

## 2017-03-03 DIAGNOSIS — H04123 Dry eye syndrome of bilateral lacrimal glands: Secondary | ICD-10-CM | POA: Diagnosis not present

## 2017-03-03 DIAGNOSIS — H16223 Keratoconjunctivitis sicca, not specified as Sjogren's, bilateral: Secondary | ICD-10-CM | POA: Diagnosis not present

## 2017-03-03 DIAGNOSIS — H52223 Regular astigmatism, bilateral: Secondary | ICD-10-CM | POA: Diagnosis not present

## 2017-03-03 DIAGNOSIS — D3131 Benign neoplasm of right choroid: Secondary | ICD-10-CM | POA: Diagnosis not present

## 2017-03-04 DIAGNOSIS — R269 Unspecified abnormalities of gait and mobility: Secondary | ICD-10-CM | POA: Diagnosis not present

## 2017-03-04 DIAGNOSIS — F0390 Unspecified dementia without behavioral disturbance: Secondary | ICD-10-CM | POA: Diagnosis not present

## 2017-03-08 DIAGNOSIS — R269 Unspecified abnormalities of gait and mobility: Secondary | ICD-10-CM | POA: Diagnosis not present

## 2017-03-08 DIAGNOSIS — F0390 Unspecified dementia without behavioral disturbance: Secondary | ICD-10-CM | POA: Diagnosis not present

## 2017-03-08 NOTE — Telephone Encounter (Signed)
Spoke to pt's wife, he is currently taking Sinemet 25-100mg  TID.  She feels his walking has continued to decline.  Also, states his memory is worse and they are considering placing him in a facility.  She and her daughter are planning to [place] in [place].  His appt is in July with Dr. Krista Blue but the patient's wife feels he needs to be seen sooner.  Per Dr. Krista Blue, please let him see NP soon.  He has seen Hoyle Sauer in the past and has been placed on her schedule for 03/09/17.

## 2017-03-08 NOTE — Telephone Encounter (Signed)
Pt wife calling asking to speak with RN Sharyn Lull due to her wanting pt seen before scheduled appointment date.  She said she was told to call and ask to speak with her if the need were to arise that pt needed to be seen earlier, please call.

## 2017-03-09 ENCOUNTER — Ambulatory Visit (INDEPENDENT_AMBULATORY_CARE_PROVIDER_SITE_OTHER): Payer: Medicare Other | Admitting: Nurse Practitioner

## 2017-03-09 ENCOUNTER — Encounter: Payer: Self-pay | Admitting: Nurse Practitioner

## 2017-03-09 VITALS — BP 114/76 | HR 81 | Ht 71.0 in

## 2017-03-09 DIAGNOSIS — G629 Polyneuropathy, unspecified: Secondary | ICD-10-CM | POA: Diagnosis not present

## 2017-03-09 DIAGNOSIS — R269 Unspecified abnormalities of gait and mobility: Secondary | ICD-10-CM

## 2017-03-09 DIAGNOSIS — G309 Alzheimer's disease, unspecified: Secondary | ICD-10-CM

## 2017-03-09 DIAGNOSIS — F0280 Dementia in other diseases classified elsewhere without behavioral disturbance: Secondary | ICD-10-CM

## 2017-03-09 DIAGNOSIS — H532 Diplopia: Secondary | ICD-10-CM

## 2017-03-09 DIAGNOSIS — R531 Weakness: Secondary | ICD-10-CM

## 2017-03-09 DIAGNOSIS — H02402 Unspecified ptosis of left eyelid: Secondary | ICD-10-CM

## 2017-03-09 DIAGNOSIS — F0390 Unspecified dementia without behavioral disturbance: Secondary | ICD-10-CM | POA: Diagnosis not present

## 2017-03-09 NOTE — Patient Instructions (Signed)
Continue Aricept 10 mg daily Continue physical therapy twice a week for strengthening and ambulation Use walker at all times for safe ambulation Continue Sinemet 3 times a day at 09am, 130p and 630p Follow-up with Dr. Krista Blue in 4 to 6 weeks weeks

## 2017-03-09 NOTE — Progress Notes (Signed)
[facility]  PATIENT: Ian Gibbs DOB: Jun 11, 1941   REASON FOR VISIT: Follow-up for gait abnormality, memory loss diplopia, generalized weakness HISTORY FROM: Patient wife and daughter Ian Gibbs    HISTORY OF PRESENT ILLNESS:[name] 76 yo RH WM with wife, is referred by his primary care physician Dr. Morrie Sheldon for evaluation of peripheral neuropathy, ptosis, blurry vision, dizziness, generalized weakness, Initial visit, March 2015.   He had past medical history of hypothyroidism, hyperlipidemia  [place], he developed gradual onset difficulty talking, his voice varies from high pitch to low, he also has mild dysphagia, [place], was treated at the [facility] by ENT, was diagnosed with bilateral vocal cord paralysis of unknown etiology, had surgery, which helped his symptoms some In 2012, he noticed bilateral feet discomfort, numbness tingling, burning discomfort, getting worse after bearing weight, gradually getting worse, he was evaluated by [facility] Dr. Ellender Hose, reported abnormal EMG nerve conduction study consistent with peripheral neuropathy, notes also reported extensive laboratory evaluation, including vitamin B1, IFEP was normal, Over the years, his symptoms has been controlled with titrating dose of gabapentin, currently he is taking 600 mg 3 times a day.  In 2013, he also developed generalized weakness, intermittent ptosis, occasionally gait difficulty, getting worse over past 1 year, over past 6 months, he noticed worsening ptosis, also intermittent double vision, especially with prolonged reading, speech is slurred, mild swallowing difficulties, gait difficulty, he also complains of dizziness, lightheadedness after prolonged standing,  MRI of the brain March 2015, showed moderate perisylvian fissure atrophy, mild to moderate small vessel disease, no acute  lesions.  Laboratory showed normal or Negative RPR, B12, C. reactive protein, folic acid, TSH, ESR, ANA, acetylcholine receptor antibody, EMG nerve conduction study March 2015,  showed length dependent mild axonal peripheral neuropathy, repetitive stimulation of right accessory nerve, recording at right upper trapezius showed no abnormalities.  He continue complains of fatigue, lack of stamina, with prolonged walking, he complains of low back pain, bilateral calf muscle tightness, achiness, he is taking Mestinon 60 mg 3 times a day, tolerating it well, but there was no significant improvement  MRI of the lumbar, only mild degenerative disc disease, there is no significant canal, of foraminal stenosis, Athena diagnostic testing showed negative musk antibody, borderline antibody to acetylcholine receptor.  CT of the chest showed mild cardiomegaly, no acute lesions, no thymus pathology noticed.  He also complains of daytime sleepiness, snoring, ESS score is 12, he tends to sleep while sitting down reading, watching TV, even sitting inactive in public places, lying down rest in the afternoon, after lunch, FSS score is 47, he also complains of dry mouth, frequent awakening at night time, he had sleep study, was put on CPAP machine,  UPDATE Jul 01 2014:YY He continues to get weaker, he complains of dizziness when getting up, lightheaded sensation, no spinning around, he has unsteady gait, rely on his cane, mild short-term memory trouble.  His maternal grandmother, mother had Alzheimer's disease.  I have reviewed the [facility] neuromuscular consultation in August 2015, and single-fiber EMG by Dr. Vallarie Mare, the conclusion was normal study. The main jitter was 33.79m, less than the normal range of 41, only one pair was abnormal at 106, and that particular repair showed blocking, 2 other pairs approached the upper limits of normal, and did not show blocking.  UPDATE March 22nd 2016:YY Right  knee replacement in Oct 16th 2015, he recovered very  well, no significant right knee pain, but he continue have gait difficulty, fell twice since, in addition, since January 2016, he developed worsening bowel and bladder incontinence,   Last visit was with Ian Gibbs in November 2015, he was diagnosed with obstructive sleep apnea and uses CPAP. Using CPAP machine, overall much improved quality of sleep,  He complains of dizziness, fussy, at different times, mostly happened when he stands, blurry vision, lasting for a few minutes, worsening memory trouble, today's Mini-Mental status examination is 28 out of 30, animal naming is 11  UPDATE April 28th 2016:YY He complains of feeling all the time, increased balance difficulty, rely on his cane, especially in past 2 weeks,he has no incontinence, he also complains of numbness at his feet, lasting for few minutes, vision is blurred, difficulty to read, even with new glasses prescirptions, he has lost sense of smell, he now sleeps well with CPAP  We have reviewed MRI of cervical spine, multilevel degenerative changes, foraminal stenosis at different levels, but no significant canal stenosis  UPDATE May 15 2015:YY He is with his wife, complains of dizziness when standing up quickly, there was no orthostatic blood pressure changes today, On further questioning, his complains of dizziness is unsteady gait, he has gradually declined functional status, has become more sedentary, unsure on his feet He continue blurry vision, could no longer read much   UPDATE Feb 9th 2017:YY He continues to decline slowly, he has more slow walking, mildly increased gait difficulty, he denies significant pain, he denies incontinence, does has urinary urgency, he has no dysphagia, no chewing difficulty,  No limb wekaness, he sleeps well now with CPAP machine,  He has mild memory trouble, he enjoys reading, but has trouble keeping up sometimes, got confused about his driving  direction occasionally   He has strong family history of dementia, his maternal grandmother, mother also suffered Alzheimer's disease.  UPDATE January 29 2016:YY Since he stopped Namenda in March 2017, his dizziness has improved, he still has positional related, dizziness, lightheadness when standing up quickly or with prolonged, walking.  He is taking Sinemet 25/100 tid, not sure about the benefit,   Wife reported one episode of fever, not feeling well, and then transient loss of consciousness, went limb, staring into the space lasting for a few minutes in March 2017, per wife, the blood pressure was elevated 150 during event,  The most bothersome symptoms for patient is intermittent dizziness especially with standing up quickly or with prolonged walking, continued mild worsening gait difficulty, bilateral feet paresthesia  UPDATE Oct 16th 2017:YY He continued to complains of mild memory loss, intermittent double vision, gait abnormality, orthostatic dizziness 50% of the time, he is able to tolerate Sinemet 25/100 mg, 3 times a day, which did help his walking some,  UPDATE Feb 09 2017:YY He is with his wife,  he noticed worsening memory loss, gait abnormality," as if everything is out of control"he has quit driving, He fell in January 2018 with rib fracture, fell again in early April 2018 backwards and landed on his occipital region at the concrete wall, he did not loss consciousness, but since then, he was noted to have slower speech, worsening gait abnormality, evening time confusion worsening memory loss, began to have night time bowel and bladder incontinence,  Last night Feb 08 2017, he slept in his recliner, he described difficulty getting out of his recliner as if he is in a truck, eventually he was able to get up, took off his depends, urinate  in the living room,  He no longer has frequent dizziness,  We have personally reviewed MRI of the brain in 2015, moderate atrophy,  supratentorium small vessel disease, UPDATE 05/30/2018CM Mr. Ian Gibbs, 76 year old male returns for follow-up with continued gait abnormality complaints of numbness in the right leg below the knee. He was just seen in the office by Dr. Krista Blue on Feb 09, 2017. He is currently receiving physical therapy for strengthening balance and ambulation. He feels this has helped some. He was titrated off of his Sinemet at his last visit. His wife called in to the office on 5/22 because of his worsening ability to ambulate since being off of the Sinemet and it was restarted. She has got just gotten up to 3 times a day this week. He is taking it at [place] and 9pm. Repeat MRI of the brain on 02/24/2017 showed no acute abnormality moderate to severe atrophy and small vessel disease of basal ganglia lacunar infarcts. Daughter is requesting a copy. Patient and wife currently live in independent living, but are now looking at assisted living and memory units. Appetite is good he is sleeping well except for having to get up to urinate every couple of hours. He has some bowel and bladder incontinence at night .He has obstructive sleep apnea but does not use his CPAP. He returns for reevaluation  REVIEW OF SYSTEMS: Full 14 system review of systems performed and notable only for those listed, all others are neg:  Constitutional: neg  Cardiovascular: neg Ear/Nose/Throat: neg  Skin: neg Eyes: Blurred vision, light sensitivity Respiratory: neg Gastroitestinal: Urinary incontinence Hematology/Lymphatic: neg  Endocrine: neg Musculoskeletal: Walking difficulty Allergy/Immunology: neg Neurological: Memory loss numbness speech difficulty and weakness Psychiatric: neg Sleep : Obstructive sleep apnea   ALLERGIES: No Known Allergies  HOME MEDICATIONS: Outpatient Medications Prior to Visit  Medication Sig Dispense Refill  . carbidopa-levodopa (SINEMET IR) 25-100 MG tablet Take 1 tablet by mouth 3 (three) times daily. 90 tablet  11  . donepezil (ARICEPT) 10 MG tablet Take 1 tablet (10 mg total) by mouth at bedtime. 30 tablet 11  . gabapentin (NEURONTIN) 300 MG capsule Take 1 capsule (300 mg total) by mouth 3 (three) times daily. Please deliver to patient. 270 capsule 1  . levothyroxine (SYNTHROID, LEVOTHROID) 75 MCG tablet Take 1 tablet (75 mcg total) by mouth daily before breakfast. 90 tablet 0  . Multiple Vitamins-Minerals (MULTIVITAMIN PO) Take by mouth daily.    . Multiple Vitamins-Minerals (PRESERVISION AREDS PO) Take by mouth daily.    Marland Kitchen omeprazole (PRILOSEC) 20 MG capsule Take 1 capsule (20 mg total) by mouth daily. 30 capsule 5  . simvastatin (ZOCOR) 20 MG tablet Take 0.5 tablets (10 mg total) by mouth every evening. 30 tablet 3   No facility-administered medications prior to visit.     PAST MEDICAL HISTORY: Past Medical History:  Diagnosis Date  . Dizziness   . GERD (gastroesophageal reflux disease)   . Hyperlipemia   . Neuromuscular disorder ([place])    alzeimer, and parkinson per wife.  . Neuropathy   . Ptosis 2013  . Sleep apnea   . Thyroid disease   . Weakness     PAST SURGICAL HISTORY: Past Surgical History:  Procedure Laterality Date  . CYST REMOVAL HAND    . LARYNX SURGERY    . THROAT SURGERY    . TONSILLECTOMY AND ADENOIDECTOMY    . TOTAL KNEE ARTHROPLASTY     Right    FAMILY HISTORY: Family History  Problem  Relation Age of Onset  . Seizures Mother   . Cancer - Other Father   . Cancer - Other Maternal Grandmother     SOCIAL HISTORY: Social History   Social History  . Marital status: Married    Spouse name: Ian Gibbs  . Number of children: 1  . Years of education: masters   Occupational History  .      Retired - Theme park manager   Social History Main Topics  . Smoking status: Never Smoker  . Smokeless tobacco: Never Used  . Alcohol use No  . Drug use: No  . Sexual activity: Not on file   Other Topics Concern  . 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  Generalized: Well developed, in no acute distress, well-groomed  Head: normocephalic and atraumatic,. Oropharynx benign  Neck: Supple, no carotid bruits  Cardiac: Regular rate rhythm, no murmur  Musculoskeletal: No deformity   Neurological examination   Mentation: Alert  MMSE not repeated  MMSE - Mini Mental State Exam 02/09/2017 07/26/2016 01/29/2016  Orientation to time 2 5 5   Orientation to Place 3 5 5   Registration 3 3 3   Attention/ Calculation 3 5 5   Recall 2 2 1   Language- name 2 objects 2 2 2   Language- repeat 1 1 1   Language- follow 3 step command 3 3 3   Language- read & follow direction 1 1 1   Write a sentence 1 1 1   Copy design 1 1 1   Total score 22 29 28     Follows all commands speech and language fluent.   Cranial nerve II-XII: .Pupils were equal round reactive to light extraocular movements were full, visual field were full on confrontational test.Static left ptosis Facial sensation and strength were normal. hearing was intact to finger rubbing bilaterally. Uvula tongue midline. head turning and shoulder shrug were normal and symmetric.Tongue protrusion into cheek strength was normal. Motor: normal bulk and tone, full strength in the BUE, mild weakness BLE, 4/5 Sensory: Decreased light touch, pinprick to above ankle level absent vibratory sensation to ankle level  Coordination: finger-nose-finger,normal difficulty performing  heel-to-shin bilaterally, Reflexes: Hypoactive and symmetric, plantar responses were flexor bilaterally. Gait and Station: Rising up from seated position with push off, stooped posture, bilateral valgrus knee, cautious mildly unsteady gait walking behind his wheelchair,  tires easily DIAGNOSTIC DATA (LABS, IMAGING, TESTING) - I reviewed patient records, labs, notes, testing and imaging myself where available.  Lab Results  Component Value Date   WBC 12.4 (H) 11/04/2016   HGB 14.4 11/04/2016   HCT 41.6 11/04/2016   MCV 90.2 11/04/2016   PLT 185 11/04/2016      Component Value Date/Time   NA 138 10/21/2016 1231   NA 141 12/12/2013 1427   K 4.3 10/21/2016 1231   CL 102 10/21/2016 1231   CO2 28 10/21/2016 1231   GLUCOSE 151 (H) 10/21/2016 1231   BUN 18 10/21/2016 1231   BUN 18 12/12/2013 1427   CREATININE 1.07 10/21/2016 1231   CALCIUM 9.4 10/21/2016 1231   PROT 7.5 10/21/2016 1231  PROT 7.2 12/12/2013 1427   ALBUMIN 4.0 10/21/2016 1231   ALBUMIN 4.5 12/12/2013 1427   AST 22 10/21/2016 1231   ALT 10 10/21/2016 1231   ALKPHOS 61 10/21/2016 1231   BILITOT 0.6 10/21/2016 1231   GFRNONAA 75 12/12/2013 1427   GFRAA 87 12/12/2013 1427   Lab Results  Component Value Date   CHOL 137 12/09/2015   HDL 47.80 12/09/2015   LDLCALC 58 12/09/2015   TRIG 155.0 (H) 12/09/2015   CHOLHDL 3 12/09/2015   Lab Results  Component Value Date   HGBA1C 5.7 10/22/2016   Lab Results  Component Value Date   VITAMINB12 693 12/12/2013   Lab Results  Component Value Date   TSH 2.11 10/21/2016      ASSESSMENT AND PLAN  76 y.o. year old male  has a past medical history of  1. Worsening memory loss most consistent with central nervous system degenerative disorder likely Alzheimer's disease.MRI of the brain on 02/24/2017 showed no acute abnormality moderate to severe atrophy and small vessel disease of basal ganglia lacunar infarcts. Most recent Mini-Mental status exam 22-30 not repeated today he has been unable to tolerate Namenda due to dizziness. Normal TSH B12 previously and no treatable etiology identified. Continue Aricept 10 mg daily 2. Diplopia extensive evaluation and no evidence of neuromuscular junctional disorder acetylcholine receptor antibody was  negative 3. Worsening gait difficulty multifactorial to include his, valgrus knees , peripheral neuropathy and deconditioning, and  brain atrophy  Continue physical therapy twice a week for strengthening and ambulation Use walker at all times for safe ambulation Continue Sinemet 3 times a day at 09am, 130p and 630p Follow-up with Dr. Krista Blue in 4 to 6 weeks weeks Greater than 50% of time during this 45  minute visit was spent on counseling,explanation of diagnosis, planning of further management, discussion with patient , wife and daughter coordination of care, reviewing previous record, encouraging them to continue to look for assisted  living memory units, will need to apply for medicaid. Daughter and wife are POAs Ian Gibbs, [facility], [facility], APRN  [facility] [address], [place], [place] [address] [phone]

## 2017-03-10 DIAGNOSIS — F0390 Unspecified dementia without behavioral disturbance: Secondary | ICD-10-CM | POA: Diagnosis not present

## 2017-03-10 DIAGNOSIS — R269 Unspecified abnormalities of gait and mobility: Secondary | ICD-10-CM | POA: Diagnosis not present

## 2017-03-14 DIAGNOSIS — F0390 Unspecified dementia without behavioral disturbance: Secondary | ICD-10-CM | POA: Diagnosis not present

## 2017-03-14 DIAGNOSIS — R269 Unspecified abnormalities of gait and mobility: Secondary | ICD-10-CM | POA: Diagnosis not present

## 2017-03-15 DIAGNOSIS — M6281 Muscle weakness (generalized): Secondary | ICD-10-CM | POA: Diagnosis present

## 2017-03-15 DIAGNOSIS — R269 Unspecified abnormalities of gait and mobility: Secondary | ICD-10-CM | POA: Diagnosis present

## 2017-03-15 DIAGNOSIS — M21061 Valgus deformity, not elsewhere classified, right knee: Secondary | ICD-10-CM | POA: Diagnosis not present

## 2017-03-15 DIAGNOSIS — G4733 Obstructive sleep apnea (adult) (pediatric): Secondary | ICD-10-CM | POA: Diagnosis present

## 2017-03-15 DIAGNOSIS — G603 Idiopathic progressive neuropathy: Secondary | ICD-10-CM | POA: Diagnosis present

## 2017-03-15 DIAGNOSIS — R5381 Other malaise: Secondary | ICD-10-CM | POA: Diagnosis not present

## 2017-03-15 DIAGNOSIS — R2689 Other abnormalities of gait and mobility: Secondary | ICD-10-CM | POA: Diagnosis not present

## 2017-03-15 DIAGNOSIS — E782 Mixed hyperlipidemia: Secondary | ICD-10-CM | POA: Diagnosis not present

## 2017-03-15 DIAGNOSIS — Z96651 Presence of right artificial knee joint: Secondary | ICD-10-CM | POA: Diagnosis not present

## 2017-03-15 DIAGNOSIS — F0390 Unspecified dementia without behavioral disturbance: Secondary | ICD-10-CM | POA: Diagnosis present

## 2017-03-15 DIAGNOSIS — G629 Polyneuropathy, unspecified: Secondary | ICD-10-CM | POA: Diagnosis not present

## 2017-03-15 DIAGNOSIS — Z79899 Other long term (current) drug therapy: Secondary | ICD-10-CM | POA: Diagnosis not present

## 2017-03-15 DIAGNOSIS — F0280 Dementia in other diseases classified elsewhere without behavioral disturbance: Secondary | ICD-10-CM | POA: Diagnosis not present

## 2017-03-15 DIAGNOSIS — G7000 Myasthenia gravis without (acute) exacerbation: Secondary | ICD-10-CM | POA: Diagnosis not present

## 2017-03-15 DIAGNOSIS — R41841 Cognitive communication deficit: Secondary | ICD-10-CM | POA: Diagnosis present

## 2017-03-15 DIAGNOSIS — K219 Gastro-esophageal reflux disease without esophagitis: Secondary | ICD-10-CM | POA: Diagnosis not present

## 2017-03-15 DIAGNOSIS — R531 Weakness: Secondary | ICD-10-CM | POA: Diagnosis present

## 2017-03-15 DIAGNOSIS — M1711 Unilateral primary osteoarthritis, right knee: Secondary | ICD-10-CM | POA: Diagnosis present

## 2017-03-15 DIAGNOSIS — E034 Atrophy of thyroid (acquired): Secondary | ICD-10-CM | POA: Diagnosis not present

## 2017-03-15 DIAGNOSIS — G309 Alzheimer's disease, unspecified: Secondary | ICD-10-CM | POA: Diagnosis not present

## 2017-03-15 DIAGNOSIS — M542 Cervicalgia: Secondary | ICD-10-CM | POA: Diagnosis not present

## 2017-03-15 DIAGNOSIS — R350 Frequency of micturition: Secondary | ICD-10-CM | POA: Diagnosis not present

## 2017-03-15 DIAGNOSIS — E876 Hypokalemia: Secondary | ICD-10-CM | POA: Diagnosis not present

## 2017-03-15 DIAGNOSIS — M21062 Valgus deformity, not elsewhere classified, left knee: Secondary | ICD-10-CM | POA: Diagnosis not present

## 2017-03-16 NOTE — Progress Notes (Signed)
I have reviewed and agreed above plan. 

## 2017-03-29 DIAGNOSIS — R195 Other fecal abnormalities: Secondary | ICD-10-CM | POA: Diagnosis not present

## 2017-03-29 DIAGNOSIS — R10816 Epigastric abdominal tenderness: Secondary | ICD-10-CM | POA: Diagnosis not present

## 2017-03-29 DIAGNOSIS — F0390 Unspecified dementia without behavioral disturbance: Secondary | ICD-10-CM | POA: Diagnosis not present

## 2017-03-29 DIAGNOSIS — M1711 Unilateral primary osteoarthritis, right knee: Secondary | ICD-10-CM | POA: Diagnosis present

## 2017-03-29 DIAGNOSIS — G6289 Other specified polyneuropathies: Secondary | ICD-10-CM | POA: Diagnosis not present

## 2017-03-29 DIAGNOSIS — F0280 Dementia in other diseases classified elsewhere without behavioral disturbance: Secondary | ICD-10-CM | POA: Diagnosis not present

## 2017-03-29 DIAGNOSIS — E782 Mixed hyperlipidemia: Secondary | ICD-10-CM | POA: Diagnosis not present

## 2017-03-29 DIAGNOSIS — R269 Unspecified abnormalities of gait and mobility: Secondary | ICD-10-CM | POA: Diagnosis not present

## 2017-03-29 DIAGNOSIS — K219 Gastro-esophageal reflux disease without esophagitis: Secondary | ICD-10-CM | POA: Diagnosis not present

## 2017-03-29 DIAGNOSIS — G7000 Myasthenia gravis without (acute) exacerbation: Secondary | ICD-10-CM | POA: Diagnosis not present

## 2017-03-29 DIAGNOSIS — R5381 Other malaise: Secondary | ICD-10-CM | POA: Diagnosis not present

## 2017-03-29 DIAGNOSIS — G603 Idiopathic progressive neuropathy: Secondary | ICD-10-CM | POA: Diagnosis present

## 2017-03-29 DIAGNOSIS — M6281 Muscle weakness (generalized): Secondary | ICD-10-CM | POA: Diagnosis present

## 2017-03-29 DIAGNOSIS — M1388 Other specified arthritis, other site: Secondary | ICD-10-CM | POA: Diagnosis not present

## 2017-03-29 DIAGNOSIS — R41841 Cognitive communication deficit: Secondary | ICD-10-CM | POA: Diagnosis present

## 2017-03-29 DIAGNOSIS — R531 Weakness: Secondary | ICD-10-CM | POA: Diagnosis not present

## 2017-03-29 DIAGNOSIS — E038 Other specified hypothyroidism: Secondary | ICD-10-CM | POA: Diagnosis not present

## 2017-03-29 DIAGNOSIS — H04123 Dry eye syndrome of bilateral lacrimal glands: Secondary | ICD-10-CM | POA: Diagnosis not present

## 2017-03-29 DIAGNOSIS — G4733 Obstructive sleep apnea (adult) (pediatric): Secondary | ICD-10-CM | POA: Diagnosis present

## 2017-04-01 DIAGNOSIS — H04123 Dry eye syndrome of bilateral lacrimal glands: Secondary | ICD-10-CM | POA: Diagnosis not present

## 2017-04-01 DIAGNOSIS — R10816 Epigastric abdominal tenderness: Secondary | ICD-10-CM | POA: Diagnosis not present

## 2017-04-01 DIAGNOSIS — R195 Other fecal abnormalities: Secondary | ICD-10-CM | POA: Diagnosis not present

## 2017-04-01 DIAGNOSIS — R5381 Other malaise: Secondary | ICD-10-CM | POA: Diagnosis not present

## 2017-04-20 DIAGNOSIS — K219 Gastro-esophageal reflux disease without esophagitis: Secondary | ICD-10-CM | POA: Diagnosis not present

## 2017-04-20 DIAGNOSIS — E782 Mixed hyperlipidemia: Secondary | ICD-10-CM | POA: Diagnosis not present

## 2017-04-20 DIAGNOSIS — E038 Other specified hypothyroidism: Secondary | ICD-10-CM | POA: Diagnosis not present

## 2017-04-20 DIAGNOSIS — F0280 Dementia in other diseases classified elsewhere without behavioral disturbance: Secondary | ICD-10-CM | POA: Diagnosis not present

## 2017-04-25 DIAGNOSIS — M1388 Other specified arthritis, other site: Secondary | ICD-10-CM | POA: Diagnosis not present

## 2017-04-25 DIAGNOSIS — G6289 Other specified polyneuropathies: Secondary | ICD-10-CM | POA: Diagnosis not present

## 2017-04-25 DIAGNOSIS — F0280 Dementia in other diseases classified elsewhere without behavioral disturbance: Secondary | ICD-10-CM | POA: Diagnosis not present

## 2017-04-27 ENCOUNTER — Ambulatory Visit (INDEPENDENT_AMBULATORY_CARE_PROVIDER_SITE_OTHER): Payer: Medicare Other | Admitting: Neurology

## 2017-04-27 ENCOUNTER — Encounter: Payer: Self-pay | Admitting: Nurse Practitioner

## 2017-04-27 VITALS — BP 137/73 | HR 87

## 2017-04-27 DIAGNOSIS — F0390 Unspecified dementia without behavioral disturbance: Secondary | ICD-10-CM

## 2017-04-27 DIAGNOSIS — R269 Unspecified abnormalities of gait and mobility: Secondary | ICD-10-CM | POA: Diagnosis not present

## 2017-04-27 NOTE — Progress Notes (Signed)
[facility]  PATIENT: Ian Gibbs DOB: 06/18/1941   HISTORY OF PRESENT ILLNESS:[name] 76 yo RH WM with wife, is referred by his primary care physician Dr. Morrie Sheldon for evaluation of peripheral neuropathy, ptosis, blurry vision, dizziness, generalized weakness, Initial visit, March 2015.   He had past medical history of hypothyroidism, hyperlipidemia  [place], he developed gradual onset difficulty talking, his voice varies from high pitch to low, he also has mild dysphagia, [place] was treated at the [facility] by ENT, was diagnosed with bilateral vocal cord paralysis of unknown etiology, had surgery, which helped his symptoms some.  In 2012, he noticed bilateral feet discomfort, numbness tingling, burning discomfort, getting worse after bearing weight, gradually getting worse, he was evaluated by [facility] Dr. Ellender Hose, reported abnormal EMG nerve conduction study consistent with peripheral neuropathy, notes also reported extensive laboratory evaluation, including vitamin B1, IFEP was normal, Over the years, his symptoms has been controlled with titrating dose of gabapentin, currently he is taking 600 mg 3 times a day.   In 2013, he also developed generalized weakness, intermittent ptosis, occasionally gait difficulty, getting worse over past 1 year, over past 6 months, he noticed worsening ptosis, also intermittent double vision, especially with prolonged reading, speech is slurred, mild swallowing difficulties, gait difficulty, he also complains of dizziness, lightheadedness after prolonged standing,  MRI of the brain March 2015, showed moderate perisylvian fissure atrophy, mild to moderate small vessel disease, no acute lesions.  Laboratory showed normal or Negative RPR, B12, C. reactive protein, folic acid, TSH, ESR, ANA, acetylcholine receptor antibody,  EMG nerve  conduction study March 2015,  showed length dependent mild axonal peripheral neuropathy, repetitive stimulation of right accessory nerve, recording at right upper trapezius showed no abnormalities.  He continue complains of fatigue, lack of stamina, with prolonged walking, he complains of low back pain, bilateral calf muscle tightness, achiness, he is taking Mestinon 60 mg 3 times a day, tolerating it well, but there was no significant improvement  MRI of the lumbar, only mild degenerative disc disease, there is no significant canal, of foraminal stenosis, Athena diagnostic testing showed negative musk antibody, borderline antibody to acetylcholine receptor.  CT of the chest showed mild cardiomegaly, no acute lesions, no thymus pathology noticed.  He also complains of daytime sleepiness, snoring, ESS score is 12, he tends to sleep while sitting down reading, watching TV, even sitting inactive in public places, lying down rest in the afternoon, after lunch, FSS score is 47, he also complains of dry mouth, frequent awakening at night time, he had sleep study, was put on CPAP machine,  UPDATE Jul 01 2014:  He continues to get weaker, he complains of dizziness when getting up, lightheaded sensation, no spinning around, he has unsteady gait, rely on his cane, mild short-term memory trouble.  His maternal grandmother, mother had Alzheimer's disease.  I have reviewed the [facility] neuromuscular consultation in August 2015, and single-fiber EMG by Dr. Vallarie Mare, the conclusion was normal study. The main jitter was 33.36m, less than the normal range of 41, only one pair was abnormal at 106, and that particular repair showed blocking, 2 other pairs approached the upper limits of normal, and did not show blocking.  UPDATE March 22nd 2016: Right knee replacement in Oct 16th 2015, he recovered very well, no significant right knee pain, but he continue have gait difficulty, fell twice since, in  addition, since  January 2016, he developed worsening bowel and bladder incontinence,   Last visit was with Hoyle Sauer in November 2015, he was diagnosed with obstructive sleep apnea and uses CPAP. Using CPAP machine, overall much improved quality of sleep,  He complains of dizziness, fussy, at different times, mostly happened when he stands, blurry vision, lasting for a few minutes, worsening memory trouble, today's Mini-Mental status examination is 28 out of 30, animal naming is 10  UPDATE April 28th 2016:  He complains of feeling all the time, increased balance difficulty, rely on his cane, especially in past 2 weeks,he has no incontinence, he also complains of numbness at his feet, lasting for few minutes, vision is blurred, difficulty to read, even with new glasses prescirptions, he has lost sense of smell, he now sleeps well with CPAP  We have reviewed MRI of cervical spine, multilevel degenerative changes, foraminal stenosis at different levels, but no significant canal stenosis  UPDATE May 15 2015: He is with his wife, complains of dizziness when standing up quickly, there was no orthostatic blood pressure changes today, On further questioning, his complains of dizziness is unsteady gait, he has gradually declined functional status, has become more sedentary, unsure on his feet He continue blurry vision, could no longer read much   UPDATE Feb 9th 2017:  He continues to decline slowly, he has more slow walking, mildly increased gait difficulty, he denies significant pain, he denies incontinence, does has urinary urgency, he has no dysphagia, no chewing difficulty,  No limb wekaness, he sleeps well now with CPAP machine,  He has mild memory trouble, he enjoys reading, but has trouble keeping up sometimes, got confused about his driving direction occasionally   He has strong family history of dementia, his maternal grandmother, mother also suffered Alzheimer's disease.  UPDATE  January 29 2016:  Since he stopped Namenda in March 2017, his dizziness has improved, he still has positional related, dizziness, lightheadness when standing up quickly or with prolonged, walking.  He is taking Sinemet 25/100 tid, not sure about the benefit,   Wife reported one episode of fever, not feeling well, and then transient loss of consciousness, went limb, staring into the space lasting for a few minutes in March 2017, per wife, the blood pressure was elevated 150 during event,  The most bothersome symptoms for patient is intermittent dizziness especially with standing up quickly or with prolonged walking, continued mild worsening gait difficulty, bilateral feet paresthesia  UPDATE Oct 16th 2017:  He continued to complains of mild memory loss, intermittent double vision, gait abnormality, orthostatic dizziness 50% of the time, he is able to tolerate Sinemet 25/100 mg, 3 times a day, which did help his walking some,  UPDATE Feb 09 2017:YY He is with his wife,  he noticed worsening memory loss, gait abnormality," as if everything is out of control"he has quit driving, He fell in January 2018 with rib fracture, fell again in early April 2018 backwards and landed on his occipital region at the concrete wall, he did not loss consciousness, but since then, he was noted to have slower speech, worsening gait abnormality, evening time confusion worsening memory loss, began to have night time bowel and bladder incontinence,  Last night Feb 08 2017, he slept in his recliner, he described difficulty getting out of his recliner as if he is in a truck, eventually he was able to get up, took off his depends, urinate in the living room,  He no longer has frequent dizziness,  We have  personally reviewed MRI of the brain in 2015, moderate atrophy, supratentorium small vessel disease,    UPDATE April 27 2017: He is accompanied by his wife, and daughter at today's clinical visit, since last visit in  May, he received home physical therapy, who has suggested inpatient rehabilitation since June, following that, he is discharged to nursing home for continued memory loss, gait abnormality, fall risks, he could not dress himself,  We went over his past medical history, progression of disease, over the past 3 years, he had gradual onset worsening memory loss, gait abnormality, orthostatic blood pressure changes, dizziness,  We have personally reviewed MRI of the brain in May 2018, in comparison with previous MRI in 2015, he has significant progression of generalized atrophy, supratentorium small vessel disease,  With this age, gradually declining functional status, family history of Alzheimer's disease, mother and maternal grandmother suffered Alzheimer's disease, steady decline of brain atrophy on MRI of the brain, more than likely, he will continue to experience worsening gait abnormality, memory loss,   REVIEW OF SYSTEMS: Full 14 system review of systems performed and notable only for those listed, all others are neg: . Fatigue, ringing ears, runny nose, double vision, eye pain, blurred vision, leg swelling, diarrhea, incontinence of bowel, frequent urination, walking difficulty, rash, memory loss, dizziness, numbness, speech difficulty, weakness, facial drooling   ALLERGIES: No Known Allergies  HOME MEDICATIONS: Outpatient Medications Prior to Visit  Medication Sig Dispense Refill  . carbidopa-levodopa (SINEMET IR) 25-100 MG tablet Take 1 tablet by mouth 3 (three) times daily. 90 tablet 11  . donepezil (ARICEPT) 10 MG tablet Take 1 tablet (10 mg total) by mouth at bedtime. 30 tablet 11  . gabapentin (NEURONTIN) 300 MG capsule Take 1 capsule (300 mg total) by mouth 3 (three) times daily. Please deliver to patient. 270 capsule 1  . levothyroxine (SYNTHROID, LEVOTHROID) 75 MCG tablet Take 1 tablet (75 mcg total) by mouth daily before breakfast. 90 tablet 0  . Multiple Vitamins-Minerals  (MULTIVITAMIN PO) Take by mouth daily.    . Multiple Vitamins-Minerals (PRESERVISION AREDS PO) Take by mouth daily.    Marland Kitchen omeprazole (PRILOSEC) 20 MG capsule Take 1 capsule (20 mg total) by mouth daily. (Patient not taking: Reported on 04/27/2017) 30 capsule 5  . simvastatin (ZOCOR) 20 MG tablet Take 0.5 tablets (10 mg total) by mouth every evening. 30 tablet 3   No facility-administered medications prior to visit.     PAST MEDICAL HISTORY: Past Medical History:  Diagnosis Date  . Dementia   . Dizziness   . GERD (gastroesophageal reflux disease)   . Hyperlipemia   . Neuromuscular disorder ([place])    alzeimer, and parkinson per wife.  . Neuropathy   . Ptosis 2013  . Sleep apnea   . Thyroid disease   . Weakness     PAST SURGICAL HISTORY: Past Surgical History:  Procedure Laterality Date  . CYST REMOVAL HAND    . LARYNX SURGERY    . THROAT SURGERY    . TONSILLECTOMY AND ADENOIDECTOMY    . TOTAL KNEE ARTHROPLASTY     Right    FAMILY HISTORY: Family History  Problem Relation Age of Onset  . Seizures Mother   . Cancer - Other Father   . Cancer - Other Maternal Grandmother     SOCIAL HISTORY: Social History   Social History  . Marital status: Married    Spouse name: [name]  . Number of children: 1  . Years of education: masters  Occupational History  .      Retired - Theme park manager   Social History Main Topics  . Smoking status: Never Smoker  . Smokeless tobacco: Never Used  . Alcohol use No  . Drug use: No  . Sexual activity: Not on file   Other Topics Concern  . 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  Generalized: Well developed, in no acute distress, well-groomed  Head: normocephalic  and atraumatic,. Oropharynx benign  Neck: Supple, no carotid bruits  Cardiac: Regular rate rhythm, no murmur  Musculoskeletal: No deformity   Neurological examination   Mentation: Alert  MMSE not repeated  MMSE - Mini Mental State Exam 04/27/2017 02/09/2017 07/26/2016  Orientation to time _0 Orientation to Place _1 Registration _2 Attention/ Calculation _3 Recall 0 2 2  Language- name 2 objects _4 Language- repeat _5 Language- follow 3 step command _6 Language- read & follow direction _7 Write a sentence _8 Copy design _9 Total score _10 Follows all commands speech and language fluent.   Cranial nerve II-XII: .Pupils were equal round reactive to light extraocular movements were full, visual field were full on confrontational test.Static left ptosis Facial sensation and strength were normal. hearing was intact to finger rubbing bilaterally. Uvula tongue midline. head turning and shoulder shrug were normal and symmetric.Tongue protrusion into cheek strength was normal. Motor: normal bulk and tone, full strength in the BUE, mild weakness BLE, 4/5 Sensory: Decreased light touch, pinprick to above ankle level absent vibratory sensation to ankle level  Coordination: finger-nose-finger,normal difficulty performing  heel-to-shin bilaterally, Reflexes: Hypoactive and symmetric, plantar responses were flexor bilaterally. Gait and Station: He had multiple attempts to get up from seated position by pushing on his walker  DIAGNOSTIC DATA (LABS, IMAGING, TESTING) - I reviewed patient records, labs, notes, testing and imaging myself where available.  Lab Results  Component Value Date   WBC 12.4 (H) 11/04/2016   HGB 14.4 11/04/2016   HCT 41.6 11/04/2016   MCV 90.2 11/04/2016   PLT 185 11/04/2016      Component Value Date/Time   NA 138 10/21/2016 1231   NA 141 12/12/2013 1427   K 4.3 10/21/2016 1231   CL 102 10/21/2016 1231   CO2 28  10/21/2016 1231   GLUCOSE 151 (H) 10/21/2016 1231   BUN 18 10/21/2016 1231   BUN 18 12/12/2013 1427   CREATININE 1.07 10/21/2016 1231   CALCIUM 9.4 10/21/2016 1231   PROT 7.5 10/21/2016 1231   PROT 7.2 12/12/2013 1427   ALBUMIN 4.0 10/21/2016 1231   ALBUMIN 4.5 12/12/2013 1427   AST 22 10/21/2016 1231   ALT 10 10/21/2016 1231   ALKPHOS 61 10/21/2016 1231   BILITOT 0.6 10/21/2016 1231   GFRNONAA 75 12/12/2013 1427   GFRAA 87 12/12/2013 1427   Lab Results  Component Value Date   CHOL 137 12/09/2015   HDL 47.80 12/09/2015  LDLCALC 58 12/09/2015   TRIG 155.0 (H) 12/09/2015   CHOLHDL 3 12/09/2015   Lab Results  Component Value Date   HGBA1C 5.7 10/22/2016   Lab Results  Component Value Date   VITAMINB12 693 12/12/2013   Lab Results  Component Value Date   TSH 2.11 10/21/2016      ASSESSMENT AND PLAN  76 y.o. year old male  has a past medical history of  Progressive memory loss, gait abnormality  I had extensive discussion with his wife, and his daughter, his clinical progression, strong family history, and MRI findings are most consistent with central nervous system degenerative disorder,  There was no treatable etiology found based on previous extensive laboratory evaluations,  More than likely he will continue to have slow worsening functional status, we have discussed long-term care options, I will refer him to social worker consultation,  Continue current Aricept 10 mg daily   Gait abnormality:  Mild improvement with Sinemet, keep on current dose 25/100 mg 3 times a day  Continue PT OT ST  Marcial Pacas, M.D. Ph.D.  [facility] [place], [place] [address] Phone: [phone] Fax:      [phone]  Face to face time was 45 minutes, greater than 50% of the time was spent in counseling and coordination of care with the patient

## 2017-04-27 NOTE — Patient Instructions (Signed)
[facility]   Adult day care center in [place], [place] Address: [address] [place], [place], [place] [address] Phone: [phone]

## 2017-04-28 ENCOUNTER — Telehealth: Payer: Self-pay | Admitting: Neurology

## 2017-04-28 NOTE — Telephone Encounter (Signed)
Ian Gibbs , Dr. Krista Blue put in Order for Social work is there any way you can add nursing to his order please?

## 2017-04-28 NOTE — Telephone Encounter (Signed)
Ok, per Dr. Krista Blue, to add nursing to orders.

## 2017-05-03 DIAGNOSIS — L600 Ingrowing nail: Secondary | ICD-10-CM | POA: Diagnosis not present

## 2017-05-03 DIAGNOSIS — K219 Gastro-esophageal reflux disease without esophagitis: Secondary | ICD-10-CM | POA: Diagnosis not present

## 2017-05-03 DIAGNOSIS — G4733 Obstructive sleep apnea (adult) (pediatric): Secondary | ICD-10-CM | POA: Diagnosis not present

## 2017-05-03 DIAGNOSIS — E039 Hypothyroidism, unspecified: Secondary | ICD-10-CM | POA: Diagnosis not present

## 2017-05-03 DIAGNOSIS — F0390 Unspecified dementia without behavioral disturbance: Secondary | ICD-10-CM | POA: Diagnosis not present

## 2017-05-03 DIAGNOSIS — K5900 Constipation, unspecified: Secondary | ICD-10-CM | POA: Diagnosis not present

## 2017-05-03 DIAGNOSIS — B351 Tinea unguium: Secondary | ICD-10-CM | POA: Diagnosis not present

## 2017-05-03 DIAGNOSIS — G7000 Myasthenia gravis without (acute) exacerbation: Secondary | ICD-10-CM | POA: Diagnosis not present

## 2017-05-03 DIAGNOSIS — H04123 Dry eye syndrome of bilateral lacrimal glands: Secondary | ICD-10-CM | POA: Diagnosis not present

## 2017-05-03 DIAGNOSIS — R269 Unspecified abnormalities of gait and mobility: Secondary | ICD-10-CM | POA: Diagnosis not present

## 2017-05-03 DIAGNOSIS — E782 Mixed hyperlipidemia: Secondary | ICD-10-CM | POA: Diagnosis not present

## 2017-05-03 DIAGNOSIS — Z658 Other specified problems related to psychosocial circumstances: Secondary | ICD-10-CM | POA: Diagnosis not present

## 2017-05-03 DIAGNOSIS — R531 Weakness: Secondary | ICD-10-CM | POA: Diagnosis not present

## 2017-05-03 DIAGNOSIS — R2681 Unsteadiness on feet: Secondary | ICD-10-CM | POA: Diagnosis not present

## 2017-05-03 DIAGNOSIS — M6281 Muscle weakness (generalized): Secondary | ICD-10-CM | POA: Diagnosis not present

## 2017-05-03 DIAGNOSIS — F329 Major depressive disorder, single episode, unspecified: Secondary | ICD-10-CM | POA: Diagnosis not present

## 2017-05-03 DIAGNOSIS — G603 Idiopathic progressive neuropathy: Secondary | ICD-10-CM | POA: Diagnosis not present

## 2017-05-03 DIAGNOSIS — R488 Other symbolic dysfunctions: Secondary | ICD-10-CM | POA: Diagnosis not present

## 2017-05-03 DIAGNOSIS — E569 Vitamin deficiency, unspecified: Secondary | ICD-10-CM | POA: Diagnosis not present

## 2017-05-03 DIAGNOSIS — M1711 Unilateral primary osteoarthritis, right knee: Secondary | ICD-10-CM | POA: Diagnosis not present

## 2017-05-05 DIAGNOSIS — F0390 Unspecified dementia without behavioral disturbance: Secondary | ICD-10-CM | POA: Diagnosis not present

## 2017-05-05 DIAGNOSIS — R531 Weakness: Secondary | ICD-10-CM | POA: Diagnosis not present

## 2017-05-05 DIAGNOSIS — G7000 Myasthenia gravis without (acute) exacerbation: Secondary | ICD-10-CM | POA: Diagnosis not present

## 2017-05-05 DIAGNOSIS — K219 Gastro-esophageal reflux disease without esophagitis: Secondary | ICD-10-CM | POA: Diagnosis not present

## 2017-05-06 DIAGNOSIS — B351 Tinea unguium: Secondary | ICD-10-CM | POA: Diagnosis not present

## 2017-05-06 DIAGNOSIS — L600 Ingrowing nail: Secondary | ICD-10-CM | POA: Diagnosis not present

## 2017-05-10 DIAGNOSIS — F0390 Unspecified dementia without behavioral disturbance: Secondary | ICD-10-CM | POA: Diagnosis not present

## 2017-05-10 DIAGNOSIS — G7000 Myasthenia gravis without (acute) exacerbation: Secondary | ICD-10-CM | POA: Diagnosis not present

## 2017-05-10 DIAGNOSIS — K219 Gastro-esophageal reflux disease without esophagitis: Secondary | ICD-10-CM | POA: Diagnosis not present

## 2017-05-10 DIAGNOSIS — R531 Weakness: Secondary | ICD-10-CM | POA: Diagnosis not present

## 2017-05-11 ENCOUNTER — Ambulatory Visit: Payer: Medicare Other | Admitting: Medical

## 2017-05-17 DIAGNOSIS — K219 Gastro-esophageal reflux disease without esophagitis: Secondary | ICD-10-CM | POA: Diagnosis not present

## 2017-05-17 DIAGNOSIS — G7000 Myasthenia gravis without (acute) exacerbation: Secondary | ICD-10-CM | POA: Diagnosis not present

## 2017-05-17 DIAGNOSIS — F0390 Unspecified dementia without behavioral disturbance: Secondary | ICD-10-CM | POA: Diagnosis not present

## 2017-05-17 DIAGNOSIS — R531 Weakness: Secondary | ICD-10-CM | POA: Diagnosis not present

## 2017-05-19 ENCOUNTER — Ambulatory Visit: Payer: Medicare Other | Admitting: Neurology

## 2017-05-25 ENCOUNTER — Ambulatory Visit: Payer: Medicare Other | Admitting: Neurology

## 2017-05-30 DIAGNOSIS — F329 Major depressive disorder, single episode, unspecified: Secondary | ICD-10-CM | POA: Diagnosis not present

## 2017-06-02 DIAGNOSIS — G7000 Myasthenia gravis without (acute) exacerbation: Secondary | ICD-10-CM | POA: Diagnosis not present

## 2017-06-02 DIAGNOSIS — F0390 Unspecified dementia without behavioral disturbance: Secondary | ICD-10-CM | POA: Diagnosis not present

## 2017-06-02 DIAGNOSIS — R531 Weakness: Secondary | ICD-10-CM | POA: Diagnosis not present

## 2017-06-02 DIAGNOSIS — K219 Gastro-esophageal reflux disease without esophagitis: Secondary | ICD-10-CM | POA: Diagnosis not present

## 2017-06-09 DIAGNOSIS — G7000 Myasthenia gravis without (acute) exacerbation: Secondary | ICD-10-CM | POA: Diagnosis not present

## 2017-06-09 DIAGNOSIS — F0390 Unspecified dementia without behavioral disturbance: Secondary | ICD-10-CM | POA: Diagnosis not present

## 2017-06-09 DIAGNOSIS — K219 Gastro-esophageal reflux disease without esophagitis: Secondary | ICD-10-CM | POA: Diagnosis not present

## 2017-06-09 DIAGNOSIS — R531 Weakness: Secondary | ICD-10-CM | POA: Diagnosis not present

## 2017-06-16 DIAGNOSIS — G629 Polyneuropathy, unspecified: Secondary | ICD-10-CM | POA: Diagnosis not present

## 2017-06-16 DIAGNOSIS — G7000 Myasthenia gravis without (acute) exacerbation: Secondary | ICD-10-CM | POA: Diagnosis not present

## 2017-06-16 DIAGNOSIS — M1711 Unilateral primary osteoarthritis, right knee: Secondary | ICD-10-CM | POA: Diagnosis not present

## 2017-06-16 DIAGNOSIS — M25561 Pain in right knee: Secondary | ICD-10-CM | POA: Diagnosis not present

## 2017-06-16 DIAGNOSIS — F0390 Unspecified dementia without behavioral disturbance: Secondary | ICD-10-CM | POA: Diagnosis not present

## 2017-07-07 DIAGNOSIS — M1990 Unspecified osteoarthritis, unspecified site: Secondary | ICD-10-CM | POA: Diagnosis not present

## 2017-07-07 DIAGNOSIS — G629 Polyneuropathy, unspecified: Secondary | ICD-10-CM | POA: Diagnosis not present

## 2017-07-07 DIAGNOSIS — G7000 Myasthenia gravis without (acute) exacerbation: Secondary | ICD-10-CM | POA: Diagnosis not present

## 2017-07-07 DIAGNOSIS — K219 Gastro-esophageal reflux disease without esophagitis: Secondary | ICD-10-CM | POA: Diagnosis not present

## 2017-07-13 DIAGNOSIS — M6281 Muscle weakness (generalized): Secondary | ICD-10-CM | POA: Diagnosis not present

## 2017-07-13 DIAGNOSIS — R2681 Unsteadiness on feet: Secondary | ICD-10-CM | POA: Diagnosis not present

## 2017-07-14 DIAGNOSIS — G4733 Obstructive sleep apnea (adult) (pediatric): Secondary | ICD-10-CM | POA: Diagnosis not present

## 2017-07-14 DIAGNOSIS — F0390 Unspecified dementia without behavioral disturbance: Secondary | ICD-10-CM | POA: Diagnosis not present

## 2017-07-14 DIAGNOSIS — G7000 Myasthenia gravis without (acute) exacerbation: Secondary | ICD-10-CM | POA: Diagnosis not present

## 2017-07-14 DIAGNOSIS — M6281 Muscle weakness (generalized): Secondary | ICD-10-CM | POA: Diagnosis not present

## 2017-07-14 DIAGNOSIS — M1711 Unilateral primary osteoarthritis, right knee: Secondary | ICD-10-CM | POA: Diagnosis not present

## 2017-07-14 DIAGNOSIS — R2681 Unsteadiness on feet: Secondary | ICD-10-CM | POA: Diagnosis not present

## 2017-07-15 DIAGNOSIS — R2681 Unsteadiness on feet: Secondary | ICD-10-CM | POA: Diagnosis not present

## 2017-07-15 DIAGNOSIS — M6281 Muscle weakness (generalized): Secondary | ICD-10-CM | POA: Diagnosis not present

## 2017-07-16 DIAGNOSIS — R2681 Unsteadiness on feet: Secondary | ICD-10-CM | POA: Diagnosis not present

## 2017-07-16 DIAGNOSIS — M6281 Muscle weakness (generalized): Secondary | ICD-10-CM | POA: Diagnosis not present

## 2017-07-19 DIAGNOSIS — M6281 Muscle weakness (generalized): Secondary | ICD-10-CM | POA: Diagnosis not present

## 2017-07-19 DIAGNOSIS — Z23 Encounter for immunization: Secondary | ICD-10-CM | POA: Diagnosis not present

## 2017-07-19 DIAGNOSIS — R2681 Unsteadiness on feet: Secondary | ICD-10-CM | POA: Diagnosis not present

## 2017-07-20 DIAGNOSIS — R2681 Unsteadiness on feet: Secondary | ICD-10-CM | POA: Diagnosis not present

## 2017-07-20 DIAGNOSIS — M6281 Muscle weakness (generalized): Secondary | ICD-10-CM | POA: Diagnosis not present

## 2017-07-21 DIAGNOSIS — R2681 Unsteadiness on feet: Secondary | ICD-10-CM | POA: Diagnosis not present

## 2017-07-21 DIAGNOSIS — M6281 Muscle weakness (generalized): Secondary | ICD-10-CM | POA: Diagnosis not present

## 2017-07-22 DIAGNOSIS — R2681 Unsteadiness on feet: Secondary | ICD-10-CM | POA: Diagnosis not present

## 2017-07-22 DIAGNOSIS — M6281 Muscle weakness (generalized): Secondary | ICD-10-CM | POA: Diagnosis not present

## 2017-07-23 DIAGNOSIS — R2681 Unsteadiness on feet: Secondary | ICD-10-CM | POA: Diagnosis not present

## 2017-07-23 DIAGNOSIS — M6281 Muscle weakness (generalized): Secondary | ICD-10-CM | POA: Diagnosis not present

## 2017-07-26 ENCOUNTER — Ambulatory Visit: Payer: Medicare Other | Admitting: Nurse Practitioner

## 2017-07-26 DIAGNOSIS — R2681 Unsteadiness on feet: Secondary | ICD-10-CM | POA: Diagnosis not present

## 2017-07-26 DIAGNOSIS — M6281 Muscle weakness (generalized): Secondary | ICD-10-CM | POA: Diagnosis not present

## 2017-07-27 DIAGNOSIS — M6281 Muscle weakness (generalized): Secondary | ICD-10-CM | POA: Diagnosis not present

## 2017-07-27 DIAGNOSIS — R2681 Unsteadiness on feet: Secondary | ICD-10-CM | POA: Diagnosis not present

## 2017-07-28 ENCOUNTER — Encounter: Payer: Self-pay | Admitting: Neurology

## 2017-07-28 ENCOUNTER — Ambulatory Visit (INDEPENDENT_AMBULATORY_CARE_PROVIDER_SITE_OTHER): Payer: Medicare Other | Admitting: Neurology

## 2017-07-28 VITALS — BP 126/71 | HR 83

## 2017-07-28 DIAGNOSIS — G629 Polyneuropathy, unspecified: Secondary | ICD-10-CM | POA: Diagnosis not present

## 2017-07-28 DIAGNOSIS — R269 Unspecified abnormalities of gait and mobility: Secondary | ICD-10-CM

## 2017-07-28 DIAGNOSIS — M6281 Muscle weakness (generalized): Secondary | ICD-10-CM | POA: Diagnosis not present

## 2017-07-28 DIAGNOSIS — G20 Parkinson's disease: Secondary | ICD-10-CM | POA: Diagnosis not present

## 2017-07-28 DIAGNOSIS — G20C Parkinsonism, unspecified: Secondary | ICD-10-CM

## 2017-07-28 DIAGNOSIS — F0390 Unspecified dementia without behavioral disturbance: Secondary | ICD-10-CM | POA: Diagnosis not present

## 2017-07-28 DIAGNOSIS — R2681 Unsteadiness on feet: Secondary | ICD-10-CM | POA: Diagnosis not present

## 2017-07-28 IMAGING — DX DG CHEST 2V
2 series · 2 of 2 positions shown · non-contrast
Comparison: 05/27/2013

CLINICAL DATA: Fever, cough

EXAM:
CHEST  2 VIEW

[chest pa]
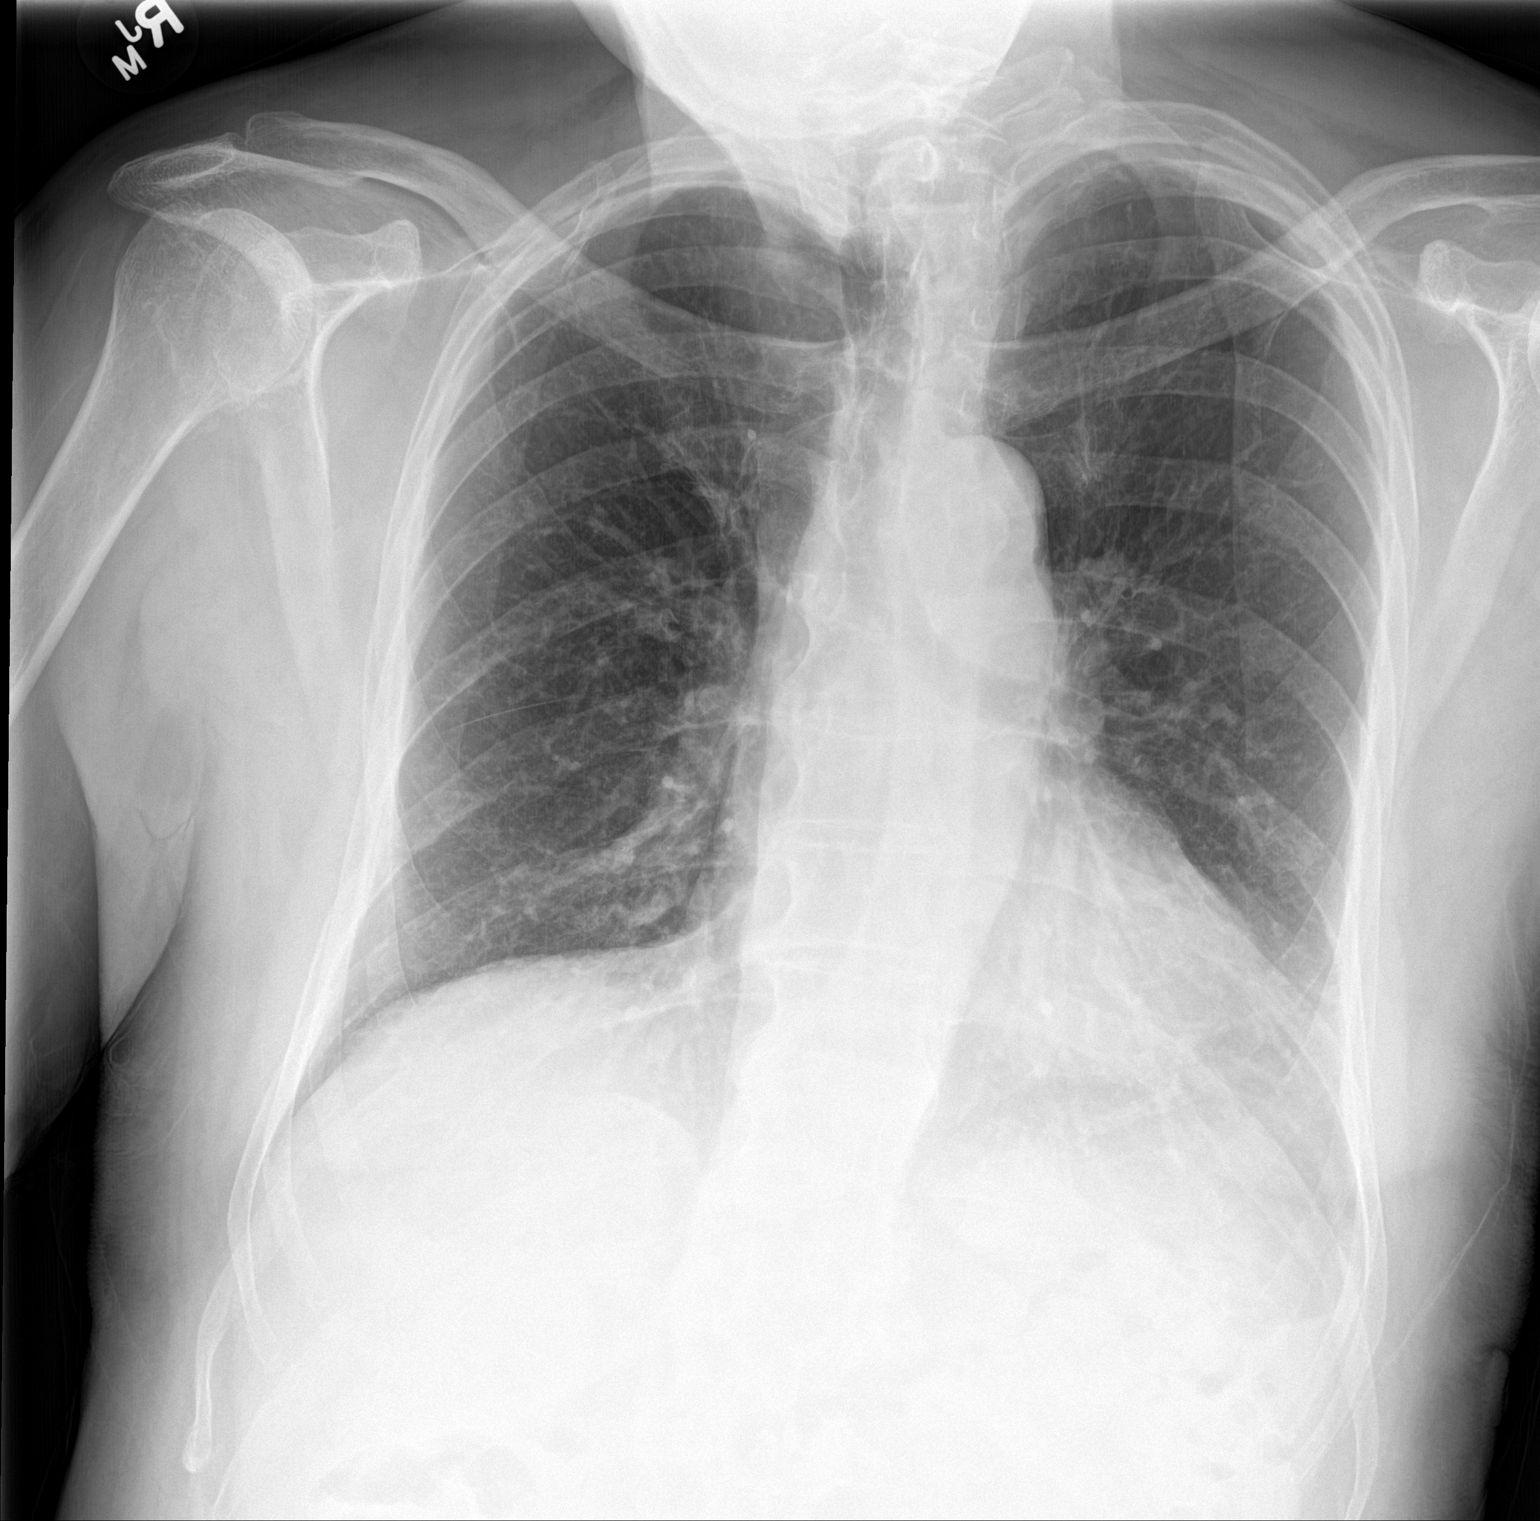

[chest lat]
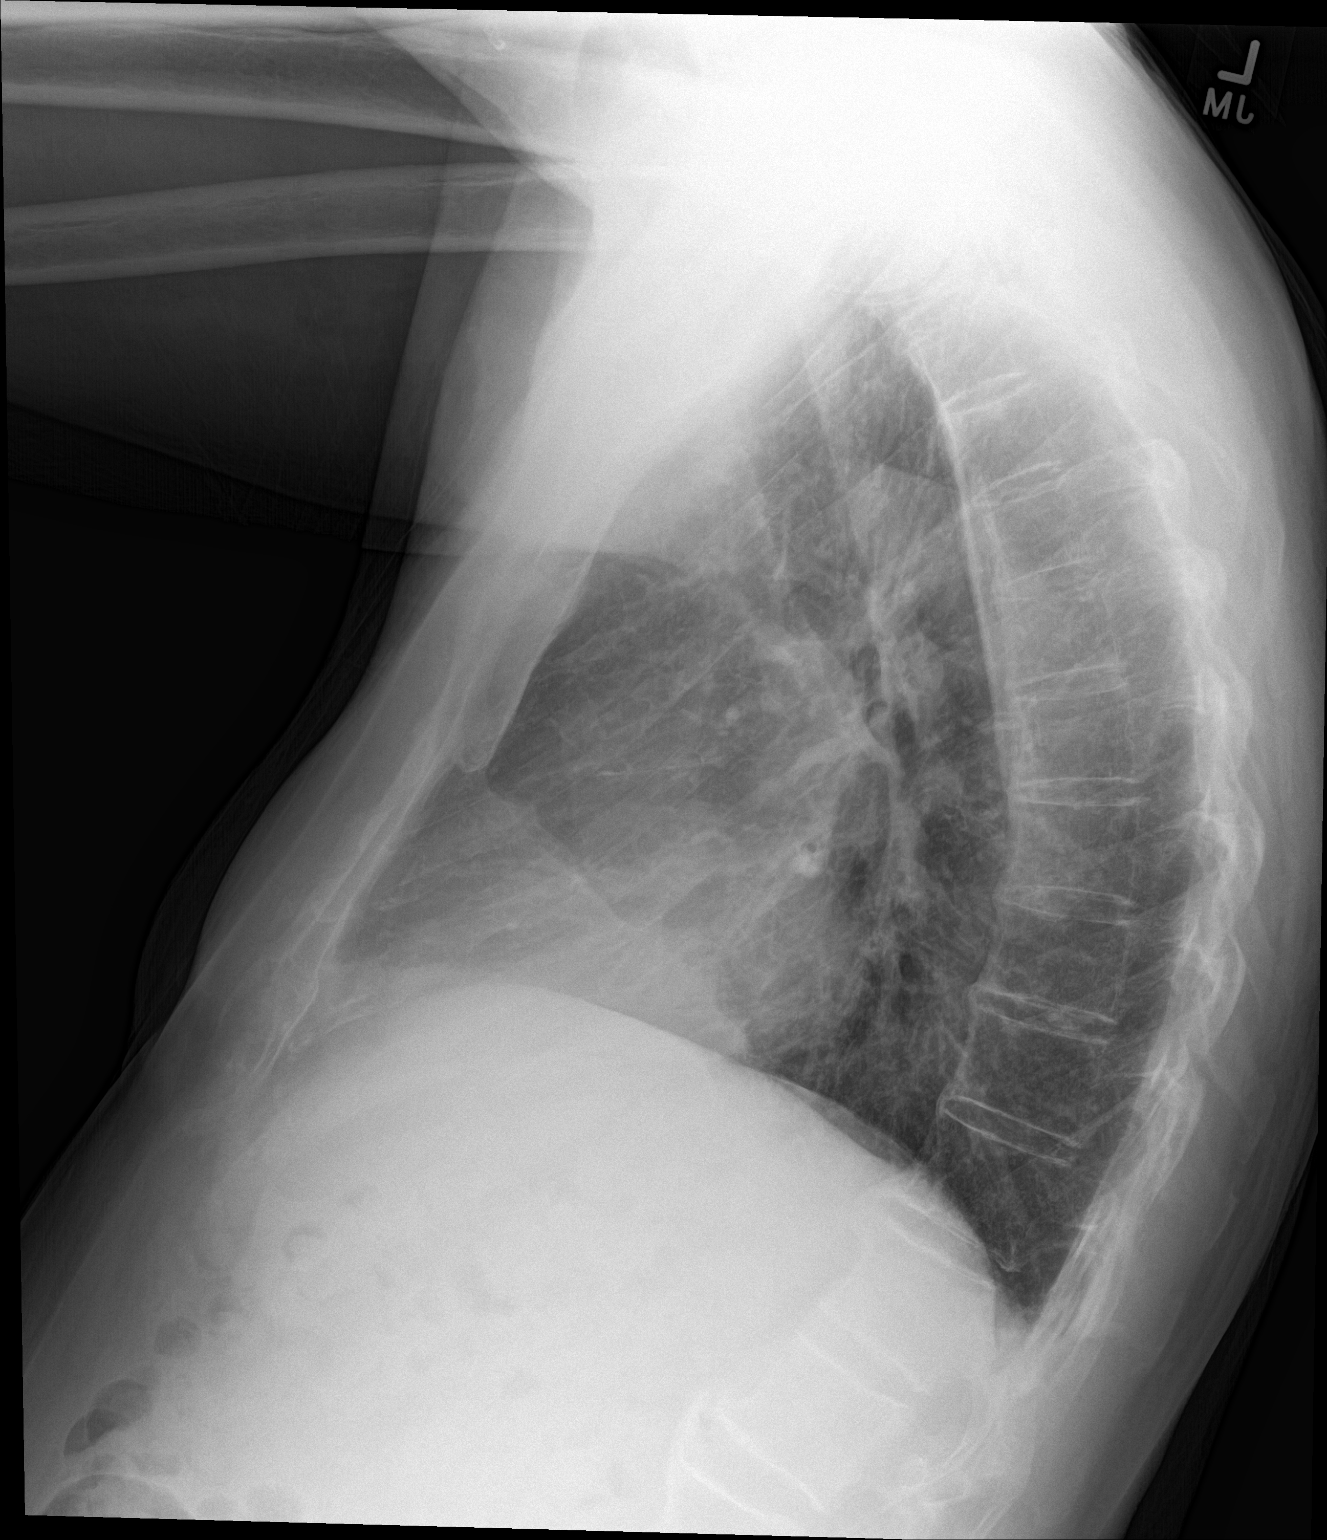

[2 of 2 positions shown; findings below may reference images not displayed]

FINDINGS: Heart and mediastinal contours are within normal limits. No focal
opacities or effusions. No acute bony abnormality.
IMPRESSION: No active cardiopulmonary disease.

## 2017-07-28 MED ORDER — MEMANTINE HCL 10 MG PO TABS: 10.0000 mg | ORAL_TABLET | Freq: Two times a day (BID) | ORAL | 11 refills | Status: AC

## 2017-07-28 MED ORDER — CARBIDOPA-LEVODOPA 25-100 MG PO TABS: 2.0000 | ORAL_TABLET | Freq: Three times a day (TID) | ORAL | 11 refills | Status: AC

## 2017-07-28 NOTE — Progress Notes (Signed)
[facility]  PATIENT: Ian Gibbs DOB: 06/18/1941   HISTORY OF PRESENT ILLNESS:[name] 76 yo RH WM with wife, is referred by his primary care physician Dr. Morrie Sheldon for evaluation of peripheral neuropathy, ptosis, blurry vision, dizziness, generalized weakness, Initial visit, March 2015.   He had past medical history of hypothyroidism, hyperlipidemia  [place], he developed gradual onset difficulty talking, his voice varies from high pitch to low, he also has mild dysphagia, [place], was treated at the [facility] by ENT, was diagnosed with bilateral vocal cord paralysis of unknown etiology, had surgery, which helped his symptoms some.  In 2012, he noticed bilateral feet discomfort, numbness tingling, burning discomfort, getting worse after bearing weight, gradually getting worse, he was evaluated by [facility] Dr. Ellender Hose, reported abnormal EMG nerve conduction study consistent with peripheral neuropathy, notes also reported extensive laboratory evaluation, including vitamin B1, IFEP was normal, Over the years, his symptoms has been controlled with titrating dose of gabapentin, currently he is taking 600 mg 3 times a day.   In 2013, he also developed generalized weakness, intermittent ptosis, occasionally gait difficulty, getting worse over past 1 year, over past 6 months, he noticed worsening ptosis, also intermittent double vision, especially with prolonged reading, speech is slurred, mild swallowing difficulties, gait difficulty, he also complains of dizziness, lightheadedness after prolonged standing,  MRI of the brain March 2015, showed moderate perisylvian fissure atrophy, mild to moderate small vessel disease, no acute lesions.  Laboratory showed normal or Negative RPR, B12, C. reactive protein, folic acid, TSH, ESR, ANA, acetylcholine receptor antibody,  EMG nerve  conduction study March 2015,  showed length dependent mild axonal peripheral neuropathy, repetitive stimulation of right accessory nerve, recording at right upper trapezius showed no abnormalities.  He continue complains of fatigue, lack of stamina, with prolonged walking, he complains of low back pain, bilateral calf muscle tightness, achiness, he is taking Mestinon 60 mg 3 times a day, tolerating it well, but there was no significant improvement  MRI of the lumbar, only mild degenerative disc disease, there is no significant canal, of foraminal stenosis, Athena diagnostic testing showed negative musk antibody, borderline antibody to acetylcholine receptor.  CT of the chest showed mild cardiomegaly, no acute lesions, no thymus pathology noticed.  He also complains of daytime sleepiness, snoring, ESS score is 12, he tends to sleep while sitting down reading, watching TV, even sitting inactive in public places, lying down rest in the afternoon, after lunch, FSS score is 47, he also complains of dry mouth, frequent awakening at night time, he had sleep study, was put on CPAP machine,  UPDATE Jul 01 2014:  He continues to get weaker, he complains of dizziness when getting up, lightheaded sensation, no spinning around, he has unsteady gait, rely on his cane, mild short-term memory trouble.  His maternal grandmother, mother had Alzheimer's disease.  I have reviewed the [facility] neuromuscular consultation in August 2015, and single-fiber EMG by Dr. Vallarie Mare, the conclusion was normal study. The main jitter was 33.36m, less than the normal range of 41, only one pair was abnormal at 106, and that particular repair showed blocking, 2 other pairs approached the upper limits of normal, and did not show blocking.  UPDATE March 22nd 2016: Right knee replacement in Oct 16th 2015, he recovered very well, no significant right knee pain, but he continue have gait difficulty, fell twice since, in  addition, since  January 2016, he developed worsening bowel and bladder incontinence,   Last visit was with Hoyle Sauer in November 2015, he was diagnosed with obstructive sleep apnea and uses CPAP. Using CPAP machine, overall much improved quality of sleep,  He complains of dizziness, fussy, at different times, mostly happened when he stands, blurry vision, lasting for a few minutes, worsening memory trouble, today's Mini-Mental status examination is 28 out of 30, animal naming is 10  UPDATE April 28th 2016:  He complains of feeling all the time, increased balance difficulty, rely on his cane, especially in past 2 weeks,he has no incontinence, he also complains of numbness at his feet, lasting for few minutes, vision is blurred, difficulty to read, even with new glasses prescirptions, he has lost sense of smell, he now sleeps well with CPAP  We have reviewed MRI of cervical spine, multilevel degenerative changes, foraminal stenosis at different levels, but no significant canal stenosis  UPDATE May 15 2015: He is with his wife, complains of dizziness when standing up quickly, there was no orthostatic blood pressure changes today, On further questioning, his complains of dizziness is unsteady gait, he has gradually declined functional status, has become more sedentary, unsure on his feet He continue blurry vision, could no longer read much   UPDATE Feb 9th 2017:  He continues to decline slowly, he has more slow walking, mildly increased gait difficulty, he denies significant pain, he denies incontinence, does has urinary urgency, he has no dysphagia, no chewing difficulty,  No limb wekaness, he sleeps well now with CPAP machine,  He has mild memory trouble, he enjoys reading, but has trouble keeping up sometimes, got confused about his driving direction occasionally   He has strong family history of dementia, his maternal grandmother, mother also suffered Alzheimer's disease.  UPDATE  January 29 2016:  Since he stopped Namenda in March 2017, his dizziness has improved, he still has positional related, dizziness, lightheadness when standing up quickly or with prolonged, walking.  He is taking Sinemet 25/100 tid, not sure about the benefit,   Wife reported one episode of fever, not feeling well, and then transient loss of consciousness, went limb, staring into the space lasting for a few minutes in March 2017, per wife, the blood pressure was elevated 150 during event,  The most bothersome symptoms for patient is intermittent dizziness especially with standing up quickly or with prolonged walking, continued mild worsening gait difficulty, bilateral feet paresthesia  UPDATE Oct 16th 2017:  He continued to complains of mild memory loss, intermittent double vision, gait abnormality, orthostatic dizziness 50% of the time, he is able to tolerate Sinemet 25/100 mg, 3 times a day, which did help his walking some,  UPDATE Feb 09 2017:YY He is with his wife,  he noticed worsening memory loss, gait abnormality," as if everything is out of control"he has quit driving, He fell in January 2018 with rib fracture, fell again in early April 2018 backwards and landed on his occipital region at the concrete wall, he did not loss consciousness, but since then, he was noted to have slower speech, worsening gait abnormality, evening time confusion worsening memory loss, began to have night time bowel and bladder incontinence,  Last night Feb 08 2017, he slept in his recliner, he described difficulty getting out of his recliner as if he is in a truck, eventually he was able to get up, took off his depends, urinate in the living room,  He no longer has frequent dizziness,  We have  personally reviewed MRI of the brain in 2015, moderate atrophy, supratentorium small vessel disease,    UPDATE April 27 2017: He is accompanied by his wife, and daughter at today's clinical visit, since last visit in  May, he received home physical therapy, who has suggested inpatient rehabilitation since June, following that, he is discharged to nursing home for continued memory loss, gait abnormality, fall risks, he could not dress himself,  We went over his past medical history, progression of disease, over the past 3 years, he had gradual onset worsening memory loss, gait abnormality, orthostatic blood pressure changes, dizziness,  We have personally reviewed MRI of the brain in May 2018, in comparison with previous MRI in 2015, he has significant progression of generalized atrophy, supratentorium small vessel disease,  With this age, gradually declining functional status, family history of Alzheimer's disease, mother and maternal grandmother suffered Alzheimer's disease, steady decline of brain atrophy on MRI of the brain, more than likely, he will continue to experience worsening gait abnormality, memory loss,  UPDATE Jul 28 2017: He is now at new nursing facility, doing well, need full assistance at daily activity.  Medication list, carbidopa levodopa 25/10 6, 14, 22, Aricept 10 mg 21, gabapentin 300 mg every 8 hours, levothyroxine, Protonix, atorvastatin 20 mg  REVIEW OF SYSTEMS: Full 14 system review of systems performed and notable only for those listed, all others are neg: . Activity change, fatigue, ringing ears, runny nose, trouble swallowing, eye itching, light sensitivity, double vision, blurry vision at swelling, incontinence of bowel bladder joint pain aching muscles walking difficulty, memory loss dizziness numbness speech difficulty weakness facial droopy  decreased concentration, confusion   ALLERGIES: No Known Allergies  HOME MEDICATIONS: Outpatient Medications Prior to Visit  Medication Sig Dispense Refill  . acetaminophen (TYLENOL) 500 MG chewable tablet Chew 500 mg by mouth every 6 (six) hours as needed for pain.    Marland Kitchen atorvastatin (LIPITOR) 20 MG tablet Take 20 mg by mouth daily.      . carbidopa-levodopa (SINEMET IR) 25-100 MG tablet Take 1 tablet by mouth 3 (three) times daily. 90 tablet 11  . Cholecalciferol (VITAMIN D3) 1000 units CAPS Take by mouth.    . donepezil (ARICEPT) 10 MG tablet Take 1 tablet (10 mg total) by mouth at bedtime. 30 tablet 11  . gabapentin (NEURONTIN) 300 MG capsule Take 1 capsule (300 mg total) by mouth 3 (three) times daily. Please deliver to patient. 270 capsule 1  . levothyroxine (SYNTHROID, LEVOTHROID) 75 MCG tablet Take 1 tablet (75 mcg total) by mouth daily before breakfast. 90 tablet 0  . Multiple Vitamins-Minerals (MULTIVITAMIN PO) Take by mouth daily.    . pantoprazole (PROTONIX) 40 MG tablet Take 40 mg by mouth daily.    Vladimir Faster Glycol-Propyl Glycol (SYSTANE) 0.4-0.3 % GEL ophthalmic gel Place 1 application into both eyes.    . simvastatin (ZOCOR) 20 MG tablet Take 0.5 tablets (10 mg total) by mouth every evening. 30 tablet 3  . Wheat Dextrin (BENEFIBER PO) Take by mouth daily.     . Multiple Vitamins-Minerals (PRESERVISION AREDS PO) Take by mouth daily.     No facility-administered medications prior to visit.     PAST MEDICAL HISTORY: Past Medical History:  Diagnosis Date  . Dementia   . Dizziness   . GERD (gastroesophageal reflux disease)   . Hyperlipemia   . Neuromuscular disorder ([place])    alzeimer, and parkinson per wife.  . Neuropathy   . Ptosis 2013  . Sleep apnea   .  Thyroid disease   . Weakness     PAST SURGICAL HISTORY: Past Surgical History:  Procedure Laterality Date  . CYST REMOVAL HAND    . LARYNX SURGERY    . THROAT SURGERY    . TONSILLECTOMY AND ADENOIDECTOMY    . TOTAL KNEE ARTHROPLASTY     Right    FAMILY HISTORY: Family History  Problem Relation Age of Onset  . Seizures Mother   . Cancer - Other Father   . Cancer - Other Maternal Grandmother     SOCIAL HISTORY: Social History   Social History  . Marital status: Married    Spouse name: [name]  . Number of children: 1  . Years of  education: masters   Occupational History  .      Retired - Theme park manager   Social History Main Topics  . Smoking status: Never Smoker  . Smokeless tobacco: Never Used  . Alcohol use No  . Drug use: No  . Sexual activity: Not on file   Other Topics Concern  . 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  Generalized: Well developed, in no acute distress, well-groomed  Head: normocephalic and atraumatic,. Oropharynx benign  Neck: Supple, no carotid bruits  Cardiac: Regular rate rhythm, no murmur  Musculoskeletal: No deformity   Neurological examination   Mentation: Alert  MMSE not repeated  MMSE - Mini Mental State Exam 07/28/2017 04/27/2017 02/09/2017  Orientation to time '4 4 2  '$ Orientation to Place '4 2 3  '$ Registration '3 3 3  '$ Attention/ Calculation '5 3 3  '$ Recall 2 0 2  Language- name 2 objects '2 2 2  '$ Language- repeat '1 1 1  '$ Language- follow 3 step command '3 3 3  '$ Language- read & follow direction '1 1 1  '$ Write a sentence '1 1 1  '$ Copy design '1 1 1  '$ Total score '27 21 22  '$ animal naming 7.    Cranial nerve II-XII: .Pupils were equal round reactive to light extraocular movements were full, visual field were full on confrontational test.Static left ptosis Facial sensation and strength were normal. hearing was intact to finger rubbing bilaterally. Uvula tongue midline. head turning and shoulder shrug were normal and symmetric.Tongue protrusion into cheek strength was normal. Motor: normal bulk and tone, full strength in the BUE, mild weakness BLE, 4/5 Sensory: Decreased light touch, pinprick to above ankle level absent vibratory sensation to ankle level  Coordination: finger-nose-finger,normal difficulty performing   heel-to-shin bilaterally, Reflexes: Hypoactive and symmetric, plantar responses were flexor bilaterally. Gait and Station: He needs 2 people assistant to get up from seated position, difficulty initiate gait  DIAGNOSTIC DATA (LABS, IMAGING, TESTING) - I reviewed patient records, labs, notes, testing and imaging myself where available.  Lab Results  Component Value Date   WBC 12.4 (H) 11/04/2016   HGB 14.4 11/04/2016   HCT 41.6 11/04/2016   MCV 90.2 11/04/2016   PLT 185 11/04/2016      Component Value Date/Time   NA 138 10/21/2016 1231   NA 141 12/12/2013 1427   K 4.3 10/21/2016 1231   CL 102  10/21/2016 1231   CO2 28 10/21/2016 1231   GLUCOSE 151 (H) 10/21/2016 1231   BUN 18 10/21/2016 1231   BUN 18 12/12/2013 1427   CREATININE 1.07 10/21/2016 1231   CALCIUM 9.4 10/21/2016 1231   PROT 7.5 10/21/2016 1231   PROT 7.2 12/12/2013 1427   ALBUMIN 4.0 10/21/2016 1231   ALBUMIN 4.5 12/12/2013 1427   AST 22 10/21/2016 1231   ALT 10 10/21/2016 1231   ALKPHOS 61 10/21/2016 1231   BILITOT 0.6 10/21/2016 1231   GFRNONAA 75 12/12/2013 1427   GFRAA 87 12/12/2013 1427   Lab Results  Component Value Date   CHOL 137 12/09/2015   HDL 47.80 12/09/2015   LDLCALC 58 12/09/2015   TRIG 155.0 (H) 12/09/2015   CHOLHDL 3 12/09/2015   Lab Results  Component Value Date   HGBA1C 5.7 10/22/2016   Lab Results  Component Value Date   VITAMINB12 693 12/12/2013   Lab Results  Component Value Date   TSH 2.11 10/21/2016      ASSESSMENT AND PLAN  76 y.o. year old male  has a past medical history of  Progressive memory loss, gait abnormality  I had extensive discussion with his wife, and his daughter, his clinical progression, strong family history, and MRI findings are most consistent with central nervous system degenerative disorder,  There was no treatable etiology found based on previous extensive laboratory evaluations,  More than likely he will continue to have slow worsening  functional status, he is at long term care facility  Continue current Aricept 10 mg daily,add on Namenda '10mg'$  bid.   Gait abnormality:  Mild improvement with Sinemet, keep on current dose 25/100 mg 3 times a day  Increase to 2 tabs every 5 hours.  Continue PT/OT  Marcial Pacas, M.D. Ph.D.  [facility] [place], [place] [address] Phone: [phone] Fax:      [phone]  Face to face time was 45 minutes, greater than 50% of the time was spent in counseling and coordination of care with the patient

## 2017-07-29 DIAGNOSIS — R2681 Unsteadiness on feet: Secondary | ICD-10-CM | POA: Diagnosis not present

## 2017-07-29 DIAGNOSIS — M6281 Muscle weakness (generalized): Secondary | ICD-10-CM | POA: Diagnosis not present

## 2017-08-01 DIAGNOSIS — M6281 Muscle weakness (generalized): Secondary | ICD-10-CM | POA: Diagnosis not present

## 2017-08-01 DIAGNOSIS — R2681 Unsteadiness on feet: Secondary | ICD-10-CM | POA: Diagnosis not present

## 2017-08-02 DIAGNOSIS — R531 Weakness: Secondary | ICD-10-CM | POA: Diagnosis not present

## 2017-08-02 DIAGNOSIS — R2681 Unsteadiness on feet: Secondary | ICD-10-CM | POA: Diagnosis not present

## 2017-08-02 DIAGNOSIS — G7000 Myasthenia gravis without (acute) exacerbation: Secondary | ICD-10-CM | POA: Diagnosis not present

## 2017-08-02 DIAGNOSIS — F0390 Unspecified dementia without behavioral disturbance: Secondary | ICD-10-CM | POA: Diagnosis not present

## 2017-08-02 DIAGNOSIS — K219 Gastro-esophageal reflux disease without esophagitis: Secondary | ICD-10-CM | POA: Diagnosis not present

## 2017-08-02 DIAGNOSIS — M6281 Muscle weakness (generalized): Secondary | ICD-10-CM | POA: Diagnosis not present

## 2017-08-11 DIAGNOSIS — K219 Gastro-esophageal reflux disease without esophagitis: Secondary | ICD-10-CM | POA: Diagnosis not present

## 2017-08-11 DIAGNOSIS — M1990 Unspecified osteoarthritis, unspecified site: Secondary | ICD-10-CM | POA: Diagnosis not present

## 2017-08-11 DIAGNOSIS — G7000 Myasthenia gravis without (acute) exacerbation: Secondary | ICD-10-CM | POA: Diagnosis not present

## 2017-08-30 DIAGNOSIS — M1990 Unspecified osteoarthritis, unspecified site: Secondary | ICD-10-CM | POA: Diagnosis not present

## 2017-08-30 DIAGNOSIS — H5712 Ocular pain, left eye: Secondary | ICD-10-CM | POA: Diagnosis not present

## 2017-08-30 DIAGNOSIS — G7000 Myasthenia gravis without (acute) exacerbation: Secondary | ICD-10-CM | POA: Diagnosis not present

## 2017-08-30 DIAGNOSIS — K219 Gastro-esophageal reflux disease without esophagitis: Secondary | ICD-10-CM | POA: Diagnosis not present

## 2017-09-15 DIAGNOSIS — F0390 Unspecified dementia without behavioral disturbance: Secondary | ICD-10-CM | POA: Diagnosis not present

## 2017-09-15 DIAGNOSIS — K219 Gastro-esophageal reflux disease without esophagitis: Secondary | ICD-10-CM | POA: Diagnosis not present

## 2017-09-15 DIAGNOSIS — G4733 Obstructive sleep apnea (adult) (pediatric): Secondary | ICD-10-CM | POA: Diagnosis not present

## 2017-09-15 DIAGNOSIS — G7000 Myasthenia gravis without (acute) exacerbation: Secondary | ICD-10-CM | POA: Diagnosis not present

## 2017-09-27 DIAGNOSIS — K219 Gastro-esophageal reflux disease without esophagitis: Secondary | ICD-10-CM | POA: Diagnosis not present

## 2017-09-27 DIAGNOSIS — F0390 Unspecified dementia without behavioral disturbance: Secondary | ICD-10-CM | POA: Diagnosis not present

## 2017-09-27 DIAGNOSIS — G4733 Obstructive sleep apnea (adult) (pediatric): Secondary | ICD-10-CM | POA: Diagnosis not present

## 2017-09-27 DIAGNOSIS — G7000 Myasthenia gravis without (acute) exacerbation: Secondary | ICD-10-CM | POA: Diagnosis not present

## 2017-10-26 ENCOUNTER — Telehealth: Payer: Self-pay | Admitting: Neurology

## 2017-10-26 MED ORDER — SERTRALINE HCL 50 MG PO TABS: 50.0000 mg | ORAL_TABLET | Freq: Every day | ORAL | 11 refills | Status: DC

## 2017-10-26 NOTE — Telephone Encounter (Signed)
Patient's wife reported that he suffered depression.  Please call patient's facility [facility] assistant living at [phone] for zoloft 50mg  qhs.

## 2017-10-26 NOTE — Addendum Note (Signed)
Addended by: Noberto Retort C on: 10/26/2017 04:19 PM   Modules accepted: Orders

## 2017-10-26 NOTE — Telephone Encounter (Signed)
Called and spoke to Quillian Quince at [facility] (ph: [phone]).  Provided verbal for Dr. Rhea Belton orders below.  He repeated the order back to me correctly.  The verbal was followed by a faxed prescription to his attention at [phone].

## 2017-11-03 DIAGNOSIS — G7000 Myasthenia gravis without (acute) exacerbation: Secondary | ICD-10-CM | POA: Diagnosis not present

## 2017-11-03 DIAGNOSIS — F329 Major depressive disorder, single episode, unspecified: Secondary | ICD-10-CM | POA: Diagnosis not present

## 2017-11-03 DIAGNOSIS — G4733 Obstructive sleep apnea (adult) (pediatric): Secondary | ICD-10-CM | POA: Diagnosis not present

## 2017-11-03 DIAGNOSIS — I10 Essential (primary) hypertension: Secondary | ICD-10-CM | POA: Diagnosis not present

## 2017-11-08 DIAGNOSIS — L84 Corns and callosities: Secondary | ICD-10-CM | POA: Diagnosis not present

## 2017-11-08 DIAGNOSIS — I739 Peripheral vascular disease, unspecified: Secondary | ICD-10-CM | POA: Diagnosis not present

## 2017-11-08 DIAGNOSIS — M79675 Pain in left toe(s): Secondary | ICD-10-CM | POA: Diagnosis not present

## 2017-11-08 DIAGNOSIS — B351 Tinea unguium: Secondary | ICD-10-CM | POA: Diagnosis not present

## 2017-11-21 DIAGNOSIS — Z79899 Other long term (current) drug therapy: Secondary | ICD-10-CM | POA: Diagnosis not present

## 2017-11-21 DIAGNOSIS — I10 Essential (primary) hypertension: Secondary | ICD-10-CM | POA: Diagnosis not present

## 2017-11-21 DIAGNOSIS — E559 Vitamin D deficiency, unspecified: Secondary | ICD-10-CM | POA: Diagnosis not present

## 2017-11-21 DIAGNOSIS — E039 Hypothyroidism, unspecified: Secondary | ICD-10-CM | POA: Diagnosis not present

## 2017-11-21 DIAGNOSIS — D649 Anemia, unspecified: Secondary | ICD-10-CM | POA: Diagnosis not present

## 2018-01-30 ENCOUNTER — Ambulatory Visit: Payer: Medicare Other | Admitting: Neurology

## 2018-02-16 ENCOUNTER — Telehealth: Payer: Self-pay | Admitting: Neurology

## 2018-02-16 NOTE — Telephone Encounter (Signed)
He has an appt on 03/16/18 and his wife has an appt on 03/28/18.  They would like to come together on the same day.  Unfortunately, we have no available times on either of the dates above.  I left her a message letting her know we are happy to look at other days that have two appt times together.  If she calls back, please give them appts back-to-back.  You can use a combination of office visit and new patient slots, if necessary.

## 2018-02-16 NOTE — Telephone Encounter (Signed)
Pts wife requesting a call back to discuss moving the pts appt to the same day as her appt 6/18. [name] is aware Dr. Rhea Belton schedule is currently book for that day. Please call to advise

## 2018-03-16 ENCOUNTER — Ambulatory Visit (INDEPENDENT_AMBULATORY_CARE_PROVIDER_SITE_OTHER): Payer: Medicare Other | Admitting: Neurology

## 2018-03-16 ENCOUNTER — Encounter: Payer: Self-pay | Admitting: Neurology

## 2018-03-16 VITALS — BP 132/78 | HR 78

## 2018-03-16 DIAGNOSIS — F0390 Unspecified dementia without behavioral disturbance: Secondary | ICD-10-CM | POA: Diagnosis not present

## 2018-03-16 DIAGNOSIS — G20 Parkinson's disease: Secondary | ICD-10-CM

## 2018-03-16 MED ORDER — SERTRALINE HCL 50 MG PO TABS: 100.0000 mg | ORAL_TABLET | Freq: Every day | ORAL | 11 refills | Status: AC

## 2018-03-16 NOTE — Progress Notes (Signed)
[facility]  PATIENT: Ian Gibbs DOB: 06/18/1941   HISTORY OF PRESENT ILLNESS:[name] 77 yo RH WM with wife, is referred by his primary care physician Dr. Morrie Sheldon for evaluation of peripheral neuropathy, ptosis, blurry vision, dizziness, generalized weakness, Initial visit, March 2015.   He had past medical history of hypothyroidism, hyperlipidemia  [place], he developed gradual onset difficulty talking, his voice varies from high pitch to low, he also has mild dysphagia, [place], was treated at the [facility] by ENT, was diagnosed with bilateral vocal cord paralysis of unknown etiology, had surgery, which helped his symptoms some.  In 2012, he noticed bilateral feet discomfort, numbness tingling, burning discomfort, getting worse after bearing weight, gradually getting worse, he was evaluated by [facility] Dr. Ellender Hose, reported abnormal EMG nerve conduction study consistent with peripheral neuropathy, notes also reported extensive laboratory evaluation, including vitamin B1, IFEP was normal, Over the years, his symptoms has been controlled with titrating dose of gabapentin, currently he is taking 600 mg 3 times a day.   In 2013, he also developed generalized weakness, intermittent ptosis, occasionally gait difficulty, getting worse over past 1 year, over past 6 months, he noticed worsening ptosis, also intermittent double vision, especially with prolonged reading, speech is slurred, mild swallowing difficulties, gait difficulty, he also complains of dizziness, lightheadedness after prolonged standing,  MRI of the brain March 2015, showed moderate perisylvian fissure atrophy, mild to moderate small vessel disease, no acute lesions.  Laboratory showed normal or Negative RPR, B12, C. reactive protein, folic acid, TSH, ESR, ANA, acetylcholine receptor antibody,  EMG nerve  conduction study March 2015,  showed length dependent mild axonal peripheral neuropathy, repetitive stimulation of right accessory nerve, recording at right upper trapezius showed no abnormalities.  He continue complains of fatigue, lack of stamina, with prolonged walking, he complains of low back pain, bilateral calf muscle tightness, achiness, he is taking Mestinon 60 mg 3 times a day, tolerating it well, but there was no significant improvement  MRI of the lumbar, only mild degenerative disc disease, there is no significant canal, of foraminal stenosis, Athena diagnostic testing showed negative musk antibody, borderline antibody to acetylcholine receptor.  CT of the chest showed mild cardiomegaly, no acute lesions, no thymus pathology noticed.  He also complains of daytime sleepiness, snoring, ESS score is 12, he tends to sleep while sitting down reading, watching TV, even sitting inactive in public places, lying down rest in the afternoon, after lunch, FSS score is 47, he also complains of dry mouth, frequent awakening at night time, he had sleep study, was put on CPAP machine,  UPDATE Jul 01 2014:  He continues to get weaker, he complains of dizziness when getting up, lightheaded sensation, no spinning around, he has unsteady gait, rely on his cane, mild short-term memory trouble.  His maternal grandmother, mother had Alzheimer's disease.  I have reviewed the [facility] neuromuscular consultation in August 2015, and single-fiber EMG by Dr. Vallarie Mare, the conclusion was normal study. The main jitter was 33.36m, less than the normal range of 41, only one pair was abnormal at 106, and that particular repair showed blocking, 2 other pairs approached the upper limits of normal, and did not show blocking.  UPDATE March 22nd 2016: Right knee replacement in Oct 16th 2015, he recovered very well, no significant right knee pain, but he continue have gait difficulty, fell twice since, in  addition, since  January 2016, he developed worsening bowel and bladder incontinence,   Last visit was with Hoyle Sauer in November 2015, he was diagnosed with obstructive sleep apnea and uses CPAP. Using CPAP machine, overall much improved quality of sleep,  He complains of dizziness, fussy, at different times, mostly happened when he stands, blurry vision, lasting for a few minutes, worsening memory trouble, today's Mini-Mental status examination is 28 out of 30, animal naming is 10  UPDATE April 28th 2016:  He complains of feeling all the time, increased balance difficulty, rely on his cane, especially in past 2 weeks,he has no incontinence, he also complains of numbness at his feet, lasting for few minutes, vision is blurred, difficulty to read, even with new glasses prescirptions, he has lost sense of smell, he now sleeps well with CPAP  We have reviewed MRI of cervical spine, multilevel degenerative changes, foraminal stenosis at different levels, but no significant canal stenosis  UPDATE May 15 2015: He is with his wife, complains of dizziness when standing up quickly, there was no orthostatic blood pressure changes today, On further questioning, his complains of dizziness is unsteady gait, he has gradually declined functional status, has become more sedentary, unsure on his feet He continue blurry vision, could no longer read much   UPDATE Feb 9th 2017:  He continues to decline slowly, he has more slow walking, mildly increased gait difficulty, he denies significant pain, he denies incontinence, does has urinary urgency, he has no dysphagia, no chewing difficulty,  No limb wekaness, he sleeps well now with CPAP machine,  He has mild memory trouble, he enjoys reading, but has trouble keeping up sometimes, got confused about his driving direction occasionally   He has strong family history of dementia, his maternal grandmother, mother also suffered Alzheimer's disease.  UPDATE  January 29 2016:  Since he stopped Namenda in March 2017, his dizziness has improved, he still has positional related, dizziness, lightheadness when standing up quickly or with prolonged, walking.  He is taking Sinemet 25/100 tid, not sure about the benefit,   Wife reported one episode of fever, not feeling well, and then transient loss of consciousness, went limb, staring into the space lasting for a few minutes in March 2017, per wife, the blood pressure was elevated 150 during event,  The most bothersome symptoms for patient is intermittent dizziness especially with standing up quickly or with prolonged walking, continued mild worsening gait difficulty, bilateral feet paresthesia  UPDATE Oct 16th 2017:  He continued to complains of mild memory loss, intermittent double vision, gait abnormality, orthostatic dizziness 50% of the time, he is able to tolerate Sinemet 25/100 mg, 3 times a day, which did help his walking some,  UPDATE Feb 09 2017:YY He is with his wife,  he noticed worsening memory loss, gait abnormality," as if everything is out of control"he has quit driving, He fell in January 2018 with rib fracture, fell again in early April 2018 backwards and landed on his occipital region at the concrete wall, he did not loss consciousness, but since then, he was noted to have slower speech, worsening gait abnormality, evening time confusion worsening memory loss, began to have night time bowel and bladder incontinence,  Last night Feb 08 2017, he slept in his recliner, he described difficulty getting out of his recliner as if he is in a truck, eventually he was able to get up, took off his depends, urinate in the living room,  He no longer has frequent dizziness,  We have  personally reviewed MRI of the brain in 2015, moderate atrophy, supratentorium small vessel disease,    UPDATE April 27 2017: He is accompanied by his wife, and daughter at today's clinical visit, since last visit in  May, he received home physical therapy, who has suggested inpatient rehabilitation since June, following that, he is discharged to nursing home for continued memory loss, gait abnormality, fall risks, he could not dress himself,  We went over his past medical history, progression of disease, over the past 3 years, he had gradual onset worsening memory loss, gait abnormality, orthostatic blood pressure changes, dizziness,  We have personally reviewed MRI of the brain in May 2018, in comparison with previous MRI in 2015, he has significant progression of generalized atrophy, supratentorium small vessel disease,  With this age, gradually declining functional status, family history of Alzheimer's disease, mother and maternal grandmother suffered Alzheimer's disease, steady decline of brain atrophy on MRI of the brain, more than likely, he will continue to experience worsening gait abnormality, memory loss,  UPDATE Jul 28 2017: He is now at new nursing facility, doing well, need full assistance at daily activity.  Medication list, carbidopa levodopa 25/10 6, 14, 22, Aricept 10 mg 21, gabapentin 300 mg every 8 hours, levothyroxine, Protonix, atorvastatin 20 mg  UPDATE March 17 2018: He was noted to have increased confusion, visual hallucinations, less mobility, still able to ambulate few steps with walker,  REVIEW OF SYSTEMS: Full 14 system review of systems performed and notable only for those listed, all others are neg: . Fatigue, runny nose, light sensitivity, double vision, cough, eye pain, walking difficulty, incontinence of bladder, frequent urination, memory loss, numbness, weakness, facial drooping, behavior changes, confusion   ALLERGIES: No Known Allergies  HOME MEDICATIONS: Outpatient Medications Prior to Visit  Medication Sig Dispense Refill  . acetaminophen (TYLENOL) 500 MG tablet Take 500 mg by mouth. Takes daily at 0900 and prn q6h.    . atorvastatin (LIPITOR) 20 MG tablet Take 20  mg by mouth at bedtime.     Marland Kitchen azelastine (ASTELIN) 0.1 % nasal spray Place 1 spray into both nostrils daily. Use in each nostril as directed    . carbidopa-levodopa (SINEMET IR) 25-100 MG tablet Take 2 tablets by mouth 3 (three) times daily. At 6am,11am, 16pm 180 tablet 11  . cetirizine (ZYRTEC) 10 MG tablet Take 10 mg by mouth daily.    . Cholecalciferol (VITAMIN D3) 1000 units CAPS Take by mouth.    . diclofenac sodium (VOLTAREN) 1 % GEL as needed.    . donepezil (ARICEPT) 10 MG tablet Take 1 tablet (10 mg total) by mouth at bedtime. 30 tablet 11  . gabapentin (NEURONTIN) 300 MG capsule Take 1 capsule (300 mg total) by mouth 3 (three) times daily. Please deliver to patient. 270 capsule 1  . levothyroxine (SYNTHROID, LEVOTHROID) 75 MCG tablet Take 1 tablet (75 mcg total) by mouth daily before breakfast. 90 tablet 0  . lisinopril (PRINIVIL,ZESTRIL) 2.5 MG tablet Take 2.5 mg by mouth daily. Hold for SBP <120    . memantine (NAMENDA) 10 MG tablet Take 1 tablet (10 mg total) by mouth 2 (two) times daily. 60 tablet 11  . Menthol, Topical Analgesic, (BIOFREEZE EX) Apply topically 3 (three) times daily.    . Multiple Vitamins-Minerals (MULTIVITAMIN PO) Take by mouth daily.    . pantoprazole (PROTONIX) 40 MG tablet Take 40 mg by mouth daily.    Vladimir Faster Glycol-Propyl Glycol (SYSTANE) 0.4-0.3 % GEL ophthalmic gel Place 1 application  into both eyes 3 (three) times daily.     . sertraline (ZOLOFT) 50 MG tablet Take 1 tablet (50 mg total) by mouth daily. 30 tablet 11  . Vitamin D, Ergocalciferol, (DRISDOL) 50000 units CAPS capsule Take 50,000 Units by mouth every 30 (thirty) days.    . Wheat Dextrin (BENEFIBER) POWD Take by mouth daily.    Marland Kitchen acetaminophen (TYLENOL) 500 MG chewable tablet Chew 500 mg by mouth every 6 (six) hours as needed for pain.    . simvastatin (ZOCOR) 20 MG tablet Take 0.5 tablets (10 mg total) by mouth every evening. 30 tablet 3  . Wheat Dextrin (BENEFIBER PO) Take by mouth daily.       No facility-administered medications prior to visit.     PAST MEDICAL HISTORY: Past Medical History:  Diagnosis Date  . Dementia   . Dizziness   . GERD (gastroesophageal reflux disease)   . Hyperlipemia   . Neuromuscular disorder ([place])    alzeimer, and parkinson per wife.  . Neuropathy   . Ptosis 2013  . Sleep apnea   . Thyroid disease   . Weakness     PAST SURGICAL HISTORY: Past Surgical History:  Procedure Laterality Date  . CYST REMOVAL HAND    . LARYNX SURGERY    . THROAT SURGERY    . TONSILLECTOMY AND ADENOIDECTOMY    . TOTAL KNEE ARTHROPLASTY     Right    FAMILY HISTORY: Family History  Problem Relation Age of Onset  . Seizures Mother   . Cancer - Other Father   . Cancer - Other Maternal Grandmother     SOCIAL HISTORY: Social History   Socioeconomic History  . Marital status: Married    Spouse name: [name]  . Number of children: 1  . Years of education: masters  . Highest education level: Not on file  Occupational History    Comment: Retired Gaffer  Social Needs  . Financial resource strain: Not on file  . Food insecurity:    Worry: Not on file    Inability: Not on file  . Transportation needs:    Medical: Not on file    Non-medical: Not on file  Tobacco Use  . Smoking status: Never Smoker  . Smokeless tobacco: Never Used  Substance and Sexual Activity  . Alcohol use: No  . Drug use: No  . Sexual activity: Not on file  Lifestyle  . Physical activity:    Days per week: Not on file    Minutes per session: Not on file  . Stress: Not on file  Relationships  . Social connections:    Talks on phone: Not on file    Gets together: Not on file    Attends religious service: Not on file    Active member of club or organization: Not on file    Attends meetings of clubs or organizations: Not on file    Relationship status: Not on file  . Intimate partner violence:    Fear of current or ex partner: Not on file    Emotionally abused: Not on  file    Physically abused: Not on file    Forced sexual activity: Not on file  Other Topics Concern  . 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  Generalized: Well developed, in no acute distress, well-groomed  Head: normocephalic and atraumatic,. Oropharynx benign  Neck: Supple, no carotid bruits  Cardiac: Regular rate rhythm, no murmur  Musculoskeletal: No deformity   Neurological examination   Mentation: Alert  MMSE not repeated  MMSE - Mini Mental State Exam 03/16/2018 07/28/2017 04/27/2017  Orientation to time _0 Orientation to Place _1 Registration _2 Attention/ Calculation _3 Recall 1 2 0  Language- name 2 objects _4 Language- repeat _5 Language- follow 3 step command _6 Language- read & follow direction _7 Write a sentence _8 Copy design _9 Total score _10 animal naming 7.    Cranial nerve II-XII: .Pupils were equal round reactive to light extraocular movements were full, visual field were full on confrontational test.Static left ptosis Facial sensation and strength were normal. hearing was intact to finger rubbing bilaterally. Uvula tongue midline. head turning and shoulder shrug were normal and symmetric.Tongue protrusion into cheek strength was normal. Motor: No significant weakness noted, mild bilateral upper extremity rigidity, bradykinesia, Sensory: Decreased light touch, pinprick to above ankle level absent vibratory sensation to ankle level  Coordination: finger-nose-finger,normal difficulty performing  heel-to-shin bilaterally, Reflexes: Hypoactive and symmetric, plantar responses were flexor bilaterally. Gait and Station: He rely on his walker to get  up from sitting to position, unsteady, en bloc turning,  DIAGNOSTIC DATA (LABS, IMAGING, TESTING) - I reviewed patient records, labs, notes, testing and imaging myself where available.  Lab Results  Component Value Date   WBC 12.4 (H) 11/04/2016   HGB 14.4 11/04/2016   HCT 41.6 11/04/2016   MCV 90.2 11/04/2016   PLT 185 11/04/2016      Component Value Date/Time   NA 138 10/21/2016 1231   NA 141 12/12/2013 1427   K 4.3 10/21/2016 1231   CL 102 10/21/2016 1231   CO2 28 10/21/2016 1231   GLUCOSE 151 (H) 10/21/2016 1231   BUN 18 10/21/2016 1231   BUN 18 12/12/2013 1427   CREATININE 1.07 10/21/2016 1231   CALCIUM 9.4 10/21/2016 1231   PROT 7.5 10/21/2016 1231   PROT 7.2 12/12/2013 1427   ALBUMIN 4.0 10/21/2016 1231   ALBUMIN 4.5 12/12/2013 1427   AST 22 10/21/2016 1231   ALT 10 10/21/2016 1231   ALKPHOS 61 10/21/2016 1231   BILITOT 0.6 10/21/2016 1231   GFRNONAA 75 12/12/2013 1427   GFRAA 87 12/12/2013 1427   Lab Results  Component Value Date   CHOL 137 12/09/2015   HDL 47.80 12/09/2015   LDLCALC 58 12/09/2015   TRIG 155.0 (H) 12/09/2015   CHOLHDL 3 12/09/2015   Lab Results  Component Value Date   HGBA1C 5.7 10/22/2016   Lab Results  Component Value Date   VITAMINB12 693 12/12/2013   Lab Results  Component Value Date   TSH 2.11 10/21/2016      ASSESSMENT AND PLAN  77 y.o. year old male  has a past medical history of  Progressive memory loss, gait abnormality  I had extensive discussion with his wife, and his daughter, his clinical progression, strong family history, and MRI progressions are most consistent with central nervous system degenerative disorder,  There was  no treatable etiology found based on previous extensive laboratory evaluations,  Continue current Aricept 10 mg daily,add on Namenda 16m bid.   Gait abnormality:  He does have mild parkinsonian features  Mild improvement with Sinemet, keep on current dose 25/100 mg 2 tablets 3 times a  day  Continue PT OT  YMarcial Pacas M.D. Ph.D.  [facility] [address] [place] [address]Phone: [phone]:      [phone] Face to face time was 45 minutes, greater than 50% of the time was spent in counseling and coordination of care with the patient

## 2018-06-01 DIAGNOSIS — R1310 Dysphagia, unspecified: Secondary | ICD-10-CM | POA: Diagnosis not present

## 2018-06-01 DIAGNOSIS — F0390 Unspecified dementia without behavioral disturbance: Secondary | ICD-10-CM | POA: Diagnosis not present

## 2018-06-01 DIAGNOSIS — Z9181 History of falling: Secondary | ICD-10-CM | POA: Diagnosis not present

## 2018-06-01 DIAGNOSIS — G20 Parkinson's disease: Secondary | ICD-10-CM | POA: Diagnosis not present

## 2018-06-01 DIAGNOSIS — G7000 Myasthenia gravis without (acute) exacerbation: Secondary | ICD-10-CM | POA: Diagnosis not present

## 2018-06-01 DIAGNOSIS — R634 Abnormal weight loss: Secondary | ICD-10-CM | POA: Diagnosis not present

## 2018-06-03 DIAGNOSIS — Z9181 History of falling: Secondary | ICD-10-CM | POA: Diagnosis not present

## 2018-06-03 DIAGNOSIS — G20 Parkinson's disease: Secondary | ICD-10-CM | POA: Diagnosis not present

## 2018-06-03 DIAGNOSIS — R1310 Dysphagia, unspecified: Secondary | ICD-10-CM | POA: Diagnosis not present

## 2018-06-03 DIAGNOSIS — G7000 Myasthenia gravis without (acute) exacerbation: Secondary | ICD-10-CM | POA: Diagnosis not present

## 2018-06-03 DIAGNOSIS — R634 Abnormal weight loss: Secondary | ICD-10-CM | POA: Diagnosis not present

## 2018-06-03 DIAGNOSIS — F0390 Unspecified dementia without behavioral disturbance: Secondary | ICD-10-CM | POA: Diagnosis not present

## 2018-06-05 DIAGNOSIS — R1310 Dysphagia, unspecified: Secondary | ICD-10-CM | POA: Diagnosis not present

## 2018-06-05 DIAGNOSIS — G20 Parkinson's disease: Secondary | ICD-10-CM | POA: Diagnosis not present

## 2018-06-05 DIAGNOSIS — Z9181 History of falling: Secondary | ICD-10-CM | POA: Diagnosis not present

## 2018-06-05 DIAGNOSIS — G7000 Myasthenia gravis without (acute) exacerbation: Secondary | ICD-10-CM | POA: Diagnosis not present

## 2018-06-05 DIAGNOSIS — R634 Abnormal weight loss: Secondary | ICD-10-CM | POA: Diagnosis not present

## 2018-06-05 DIAGNOSIS — F0390 Unspecified dementia without behavioral disturbance: Secondary | ICD-10-CM | POA: Diagnosis not present

## 2018-06-07 DIAGNOSIS — G20 Parkinson's disease: Secondary | ICD-10-CM | POA: Diagnosis not present

## 2018-06-07 DIAGNOSIS — Z9181 History of falling: Secondary | ICD-10-CM | POA: Diagnosis not present

## 2018-06-07 DIAGNOSIS — R634 Abnormal weight loss: Secondary | ICD-10-CM | POA: Diagnosis not present

## 2018-06-07 DIAGNOSIS — R1310 Dysphagia, unspecified: Secondary | ICD-10-CM | POA: Diagnosis not present

## 2018-06-07 DIAGNOSIS — F0390 Unspecified dementia without behavioral disturbance: Secondary | ICD-10-CM | POA: Diagnosis not present

## 2018-06-07 DIAGNOSIS — G7000 Myasthenia gravis without (acute) exacerbation: Secondary | ICD-10-CM | POA: Diagnosis not present

## 2018-06-09 DIAGNOSIS — G20 Parkinson's disease: Secondary | ICD-10-CM | POA: Diagnosis not present

## 2018-06-09 DIAGNOSIS — G7000 Myasthenia gravis without (acute) exacerbation: Secondary | ICD-10-CM | POA: Diagnosis not present

## 2018-06-09 DIAGNOSIS — Z9181 History of falling: Secondary | ICD-10-CM | POA: Diagnosis not present

## 2018-06-09 DIAGNOSIS — R1310 Dysphagia, unspecified: Secondary | ICD-10-CM | POA: Diagnosis not present

## 2018-06-09 DIAGNOSIS — R634 Abnormal weight loss: Secondary | ICD-10-CM | POA: Diagnosis not present

## 2018-06-09 DIAGNOSIS — F0390 Unspecified dementia without behavioral disturbance: Secondary | ICD-10-CM | POA: Diagnosis not present

## 2018-06-11 DIAGNOSIS — R1310 Dysphagia, unspecified: Secondary | ICD-10-CM | POA: Diagnosis not present

## 2018-06-11 DIAGNOSIS — Z9181 History of falling: Secondary | ICD-10-CM | POA: Diagnosis not present

## 2018-06-11 DIAGNOSIS — G7000 Myasthenia gravis without (acute) exacerbation: Secondary | ICD-10-CM | POA: Diagnosis not present

## 2018-06-11 DIAGNOSIS — G20 Parkinson's disease: Secondary | ICD-10-CM | POA: Diagnosis not present

## 2018-06-11 DIAGNOSIS — F0390 Unspecified dementia without behavioral disturbance: Secondary | ICD-10-CM | POA: Diagnosis not present

## 2018-06-11 DIAGNOSIS — R634 Abnormal weight loss: Secondary | ICD-10-CM | POA: Diagnosis not present

## 2018-06-12 DIAGNOSIS — F0390 Unspecified dementia without behavioral disturbance: Secondary | ICD-10-CM | POA: Diagnosis not present

## 2018-06-12 DIAGNOSIS — Z9181 History of falling: Secondary | ICD-10-CM | POA: Diagnosis not present

## 2018-06-12 DIAGNOSIS — R634 Abnormal weight loss: Secondary | ICD-10-CM | POA: Diagnosis not present

## 2018-06-12 DIAGNOSIS — G20 Parkinson's disease: Secondary | ICD-10-CM | POA: Diagnosis not present

## 2018-06-12 DIAGNOSIS — G7000 Myasthenia gravis without (acute) exacerbation: Secondary | ICD-10-CM | POA: Diagnosis not present

## 2018-06-12 DIAGNOSIS — R1310 Dysphagia, unspecified: Secondary | ICD-10-CM | POA: Diagnosis not present

## 2018-06-14 DIAGNOSIS — R634 Abnormal weight loss: Secondary | ICD-10-CM | POA: Diagnosis not present

## 2018-06-14 DIAGNOSIS — R1310 Dysphagia, unspecified: Secondary | ICD-10-CM | POA: Diagnosis not present

## 2018-06-14 DIAGNOSIS — G7000 Myasthenia gravis without (acute) exacerbation: Secondary | ICD-10-CM | POA: Diagnosis not present

## 2018-06-14 DIAGNOSIS — G20 Parkinson's disease: Secondary | ICD-10-CM | POA: Diagnosis not present

## 2018-06-14 DIAGNOSIS — Z9181 History of falling: Secondary | ICD-10-CM | POA: Diagnosis not present

## 2018-06-14 DIAGNOSIS — F0390 Unspecified dementia without behavioral disturbance: Secondary | ICD-10-CM | POA: Diagnosis not present

## 2018-06-15 DIAGNOSIS — K219 Gastro-esophageal reflux disease without esophagitis: Secondary | ICD-10-CM | POA: Diagnosis not present

## 2018-06-15 DIAGNOSIS — R634 Abnormal weight loss: Secondary | ICD-10-CM | POA: Diagnosis not present

## 2018-06-15 DIAGNOSIS — R1310 Dysphagia, unspecified: Secondary | ICD-10-CM | POA: Diagnosis not present

## 2018-06-15 DIAGNOSIS — G7000 Myasthenia gravis without (acute) exacerbation: Secondary | ICD-10-CM | POA: Diagnosis not present

## 2018-06-15 DIAGNOSIS — Z9181 History of falling: Secondary | ICD-10-CM | POA: Diagnosis not present

## 2018-06-15 DIAGNOSIS — G20 Parkinson's disease: Secondary | ICD-10-CM | POA: Diagnosis not present

## 2018-06-15 DIAGNOSIS — F0390 Unspecified dementia without behavioral disturbance: Secondary | ICD-10-CM | POA: Diagnosis not present

## 2018-06-15 DIAGNOSIS — R42 Dizziness and giddiness: Secondary | ICD-10-CM | POA: Diagnosis not present

## 2018-06-15 DIAGNOSIS — H109 Unspecified conjunctivitis: Secondary | ICD-10-CM | POA: Diagnosis not present

## 2018-06-16 DIAGNOSIS — Z9181 History of falling: Secondary | ICD-10-CM | POA: Diagnosis not present

## 2018-06-16 DIAGNOSIS — G20 Parkinson's disease: Secondary | ICD-10-CM | POA: Diagnosis not present

## 2018-06-16 DIAGNOSIS — R1310 Dysphagia, unspecified: Secondary | ICD-10-CM | POA: Diagnosis not present

## 2018-06-16 DIAGNOSIS — F0390 Unspecified dementia without behavioral disturbance: Secondary | ICD-10-CM | POA: Diagnosis not present

## 2018-06-16 DIAGNOSIS — R634 Abnormal weight loss: Secondary | ICD-10-CM | POA: Diagnosis not present

## 2018-06-16 DIAGNOSIS — G7000 Myasthenia gravis without (acute) exacerbation: Secondary | ICD-10-CM | POA: Diagnosis not present

## 2018-06-19 DIAGNOSIS — G7000 Myasthenia gravis without (acute) exacerbation: Secondary | ICD-10-CM | POA: Diagnosis not present

## 2018-06-19 DIAGNOSIS — R634 Abnormal weight loss: Secondary | ICD-10-CM | POA: Diagnosis not present

## 2018-06-19 DIAGNOSIS — G20 Parkinson's disease: Secondary | ICD-10-CM | POA: Diagnosis not present

## 2018-06-19 DIAGNOSIS — F0390 Unspecified dementia without behavioral disturbance: Secondary | ICD-10-CM | POA: Diagnosis not present

## 2018-06-19 DIAGNOSIS — R1310 Dysphagia, unspecified: Secondary | ICD-10-CM | POA: Diagnosis not present

## 2018-06-19 DIAGNOSIS — Z9181 History of falling: Secondary | ICD-10-CM | POA: Diagnosis not present

## 2018-06-20 DIAGNOSIS — F329 Major depressive disorder, single episode, unspecified: Secondary | ICD-10-CM | POA: Diagnosis not present

## 2018-06-20 DIAGNOSIS — K219 Gastro-esophageal reflux disease without esophagitis: Secondary | ICD-10-CM | POA: Diagnosis not present

## 2018-06-20 DIAGNOSIS — I739 Peripheral vascular disease, unspecified: Secondary | ICD-10-CM | POA: Diagnosis not present

## 2018-06-20 DIAGNOSIS — H109 Unspecified conjunctivitis: Secondary | ICD-10-CM | POA: Diagnosis not present

## 2018-06-20 DIAGNOSIS — G7000 Myasthenia gravis without (acute) exacerbation: Secondary | ICD-10-CM | POA: Diagnosis not present

## 2018-06-21 DIAGNOSIS — R1310 Dysphagia, unspecified: Secondary | ICD-10-CM | POA: Diagnosis not present

## 2018-06-21 DIAGNOSIS — F0390 Unspecified dementia without behavioral disturbance: Secondary | ICD-10-CM | POA: Diagnosis not present

## 2018-06-21 DIAGNOSIS — G7000 Myasthenia gravis without (acute) exacerbation: Secondary | ICD-10-CM | POA: Diagnosis not present

## 2018-06-21 DIAGNOSIS — R634 Abnormal weight loss: Secondary | ICD-10-CM | POA: Diagnosis not present

## 2018-06-21 DIAGNOSIS — G20 Parkinson's disease: Secondary | ICD-10-CM | POA: Diagnosis not present

## 2018-06-21 DIAGNOSIS — Z9181 History of falling: Secondary | ICD-10-CM | POA: Diagnosis not present

## 2018-06-22 DIAGNOSIS — G7000 Myasthenia gravis without (acute) exacerbation: Secondary | ICD-10-CM | POA: Diagnosis not present

## 2018-06-22 DIAGNOSIS — Z9181 History of falling: Secondary | ICD-10-CM | POA: Diagnosis not present

## 2018-06-22 DIAGNOSIS — F0390 Unspecified dementia without behavioral disturbance: Secondary | ICD-10-CM | POA: Diagnosis not present

## 2018-06-22 DIAGNOSIS — R1310 Dysphagia, unspecified: Secondary | ICD-10-CM | POA: Diagnosis not present

## 2018-06-22 DIAGNOSIS — G20 Parkinson's disease: Secondary | ICD-10-CM | POA: Diagnosis not present

## 2018-06-22 DIAGNOSIS — R634 Abnormal weight loss: Secondary | ICD-10-CM | POA: Diagnosis not present

## 2018-06-23 DIAGNOSIS — Z9181 History of falling: Secondary | ICD-10-CM | POA: Diagnosis not present

## 2018-06-23 DIAGNOSIS — R1310 Dysphagia, unspecified: Secondary | ICD-10-CM | POA: Diagnosis not present

## 2018-06-23 DIAGNOSIS — F0390 Unspecified dementia without behavioral disturbance: Secondary | ICD-10-CM | POA: Diagnosis not present

## 2018-06-23 DIAGNOSIS — G20 Parkinson's disease: Secondary | ICD-10-CM | POA: Diagnosis not present

## 2018-06-23 DIAGNOSIS — G7000 Myasthenia gravis without (acute) exacerbation: Secondary | ICD-10-CM | POA: Diagnosis not present

## 2018-06-23 DIAGNOSIS — R634 Abnormal weight loss: Secondary | ICD-10-CM | POA: Diagnosis not present

## 2018-06-26 DIAGNOSIS — Z9181 History of falling: Secondary | ICD-10-CM | POA: Diagnosis not present

## 2018-06-26 DIAGNOSIS — F0390 Unspecified dementia without behavioral disturbance: Secondary | ICD-10-CM | POA: Diagnosis not present

## 2018-06-26 DIAGNOSIS — R1310 Dysphagia, unspecified: Secondary | ICD-10-CM | POA: Diagnosis not present

## 2018-06-26 DIAGNOSIS — R634 Abnormal weight loss: Secondary | ICD-10-CM | POA: Diagnosis not present

## 2018-06-26 DIAGNOSIS — G20 Parkinson's disease: Secondary | ICD-10-CM | POA: Diagnosis not present

## 2018-06-26 DIAGNOSIS — G7000 Myasthenia gravis without (acute) exacerbation: Secondary | ICD-10-CM | POA: Diagnosis not present

## 2018-06-28 DIAGNOSIS — Z9181 History of falling: Secondary | ICD-10-CM | POA: Diagnosis not present

## 2018-06-28 DIAGNOSIS — G20 Parkinson's disease: Secondary | ICD-10-CM | POA: Diagnosis not present

## 2018-06-28 DIAGNOSIS — R634 Abnormal weight loss: Secondary | ICD-10-CM | POA: Diagnosis not present

## 2018-06-28 DIAGNOSIS — R1310 Dysphagia, unspecified: Secondary | ICD-10-CM | POA: Diagnosis not present

## 2018-06-28 DIAGNOSIS — G7000 Myasthenia gravis without (acute) exacerbation: Secondary | ICD-10-CM | POA: Diagnosis not present

## 2018-06-28 DIAGNOSIS — F0390 Unspecified dementia without behavioral disturbance: Secondary | ICD-10-CM | POA: Diagnosis not present

## 2018-06-29 DIAGNOSIS — G20 Parkinson's disease: Secondary | ICD-10-CM | POA: Diagnosis not present

## 2018-06-29 DIAGNOSIS — Z9181 History of falling: Secondary | ICD-10-CM | POA: Diagnosis not present

## 2018-06-29 DIAGNOSIS — R1310 Dysphagia, unspecified: Secondary | ICD-10-CM | POA: Diagnosis not present

## 2018-06-29 DIAGNOSIS — F0390 Unspecified dementia without behavioral disturbance: Secondary | ICD-10-CM | POA: Diagnosis not present

## 2018-06-29 DIAGNOSIS — R634 Abnormal weight loss: Secondary | ICD-10-CM | POA: Diagnosis not present

## 2018-06-29 DIAGNOSIS — G7000 Myasthenia gravis without (acute) exacerbation: Secondary | ICD-10-CM | POA: Diagnosis not present

## 2018-06-30 DIAGNOSIS — G20 Parkinson's disease: Secondary | ICD-10-CM | POA: Diagnosis not present

## 2018-06-30 DIAGNOSIS — F0390 Unspecified dementia without behavioral disturbance: Secondary | ICD-10-CM | POA: Diagnosis not present

## 2018-06-30 DIAGNOSIS — R634 Abnormal weight loss: Secondary | ICD-10-CM | POA: Diagnosis not present

## 2018-06-30 DIAGNOSIS — G7000 Myasthenia gravis without (acute) exacerbation: Secondary | ICD-10-CM | POA: Diagnosis not present

## 2018-06-30 DIAGNOSIS — Z9181 History of falling: Secondary | ICD-10-CM | POA: Diagnosis not present

## 2018-06-30 DIAGNOSIS — R1310 Dysphagia, unspecified: Secondary | ICD-10-CM | POA: Diagnosis not present

## 2018-07-03 DIAGNOSIS — F0390 Unspecified dementia without behavioral disturbance: Secondary | ICD-10-CM | POA: Diagnosis not present

## 2018-07-03 DIAGNOSIS — Z9181 History of falling: Secondary | ICD-10-CM | POA: Diagnosis not present

## 2018-07-03 DIAGNOSIS — R634 Abnormal weight loss: Secondary | ICD-10-CM | POA: Diagnosis not present

## 2018-07-03 DIAGNOSIS — G7000 Myasthenia gravis without (acute) exacerbation: Secondary | ICD-10-CM | POA: Diagnosis not present

## 2018-07-03 DIAGNOSIS — G20 Parkinson's disease: Secondary | ICD-10-CM | POA: Diagnosis not present

## 2018-07-03 DIAGNOSIS — R1310 Dysphagia, unspecified: Secondary | ICD-10-CM | POA: Diagnosis not present

## 2018-07-05 DIAGNOSIS — R634 Abnormal weight loss: Secondary | ICD-10-CM | POA: Diagnosis not present

## 2018-07-05 DIAGNOSIS — G20 Parkinson's disease: Secondary | ICD-10-CM | POA: Diagnosis not present

## 2018-07-05 DIAGNOSIS — R1310 Dysphagia, unspecified: Secondary | ICD-10-CM | POA: Diagnosis not present

## 2018-07-05 DIAGNOSIS — G7000 Myasthenia gravis without (acute) exacerbation: Secondary | ICD-10-CM | POA: Diagnosis not present

## 2018-07-05 DIAGNOSIS — Z9181 History of falling: Secondary | ICD-10-CM | POA: Diagnosis not present

## 2018-07-05 DIAGNOSIS — F0390 Unspecified dementia without behavioral disturbance: Secondary | ICD-10-CM | POA: Diagnosis not present

## 2018-07-06 DIAGNOSIS — F0390 Unspecified dementia without behavioral disturbance: Secondary | ICD-10-CM | POA: Diagnosis not present

## 2018-07-06 DIAGNOSIS — G7000 Myasthenia gravis without (acute) exacerbation: Secondary | ICD-10-CM | POA: Diagnosis not present

## 2018-07-06 DIAGNOSIS — G20 Parkinson's disease: Secondary | ICD-10-CM | POA: Diagnosis not present

## 2018-07-06 DIAGNOSIS — R634 Abnormal weight loss: Secondary | ICD-10-CM | POA: Diagnosis not present

## 2018-07-06 DIAGNOSIS — R1310 Dysphagia, unspecified: Secondary | ICD-10-CM | POA: Diagnosis not present

## 2018-07-06 DIAGNOSIS — Z9181 History of falling: Secondary | ICD-10-CM | POA: Diagnosis not present

## 2018-07-07 DIAGNOSIS — G7000 Myasthenia gravis without (acute) exacerbation: Secondary | ICD-10-CM | POA: Diagnosis not present

## 2018-07-07 DIAGNOSIS — F0390 Unspecified dementia without behavioral disturbance: Secondary | ICD-10-CM | POA: Diagnosis not present

## 2018-07-07 DIAGNOSIS — R1310 Dysphagia, unspecified: Secondary | ICD-10-CM | POA: Diagnosis not present

## 2018-07-07 DIAGNOSIS — R634 Abnormal weight loss: Secondary | ICD-10-CM | POA: Diagnosis not present

## 2018-07-07 DIAGNOSIS — G20 Parkinson's disease: Secondary | ICD-10-CM | POA: Diagnosis not present

## 2018-07-07 DIAGNOSIS — Z9181 History of falling: Secondary | ICD-10-CM | POA: Diagnosis not present

## 2018-07-10 DIAGNOSIS — R1310 Dysphagia, unspecified: Secondary | ICD-10-CM | POA: Diagnosis not present

## 2018-07-10 DIAGNOSIS — F0390 Unspecified dementia without behavioral disturbance: Secondary | ICD-10-CM | POA: Diagnosis not present

## 2018-07-10 DIAGNOSIS — G7000 Myasthenia gravis without (acute) exacerbation: Secondary | ICD-10-CM | POA: Diagnosis not present

## 2018-07-10 DIAGNOSIS — Z9181 History of falling: Secondary | ICD-10-CM | POA: Diagnosis not present

## 2018-07-10 DIAGNOSIS — R634 Abnormal weight loss: Secondary | ICD-10-CM | POA: Diagnosis not present

## 2018-07-10 DIAGNOSIS — G20 Parkinson's disease: Secondary | ICD-10-CM | POA: Diagnosis not present

## 2018-07-11 DIAGNOSIS — G20 Parkinson's disease: Secondary | ICD-10-CM | POA: Diagnosis not present

## 2018-07-11 DIAGNOSIS — F0390 Unspecified dementia without behavioral disturbance: Secondary | ICD-10-CM | POA: Diagnosis not present

## 2018-07-11 DIAGNOSIS — R634 Abnormal weight loss: Secondary | ICD-10-CM | POA: Diagnosis not present

## 2018-07-11 DIAGNOSIS — G7000 Myasthenia gravis without (acute) exacerbation: Secondary | ICD-10-CM | POA: Diagnosis not present

## 2018-07-11 DIAGNOSIS — R1310 Dysphagia, unspecified: Secondary | ICD-10-CM | POA: Diagnosis not present

## 2018-07-11 DIAGNOSIS — Z9181 History of falling: Secondary | ICD-10-CM | POA: Diagnosis not present

## 2018-07-12 DIAGNOSIS — G7000 Myasthenia gravis without (acute) exacerbation: Secondary | ICD-10-CM | POA: Diagnosis not present

## 2018-07-12 DIAGNOSIS — F0390 Unspecified dementia without behavioral disturbance: Secondary | ICD-10-CM | POA: Diagnosis not present

## 2018-07-12 DIAGNOSIS — G20 Parkinson's disease: Secondary | ICD-10-CM | POA: Diagnosis not present

## 2018-07-12 DIAGNOSIS — R634 Abnormal weight loss: Secondary | ICD-10-CM | POA: Diagnosis not present

## 2018-07-12 DIAGNOSIS — R1310 Dysphagia, unspecified: Secondary | ICD-10-CM | POA: Diagnosis not present

## 2018-07-12 DIAGNOSIS — Z9181 History of falling: Secondary | ICD-10-CM | POA: Diagnosis not present

## 2018-07-13 DIAGNOSIS — K219 Gastro-esophageal reflux disease without esophagitis: Secondary | ICD-10-CM | POA: Diagnosis not present

## 2018-07-13 DIAGNOSIS — Z9181 History of falling: Secondary | ICD-10-CM | POA: Diagnosis not present

## 2018-07-13 DIAGNOSIS — G4733 Obstructive sleep apnea (adult) (pediatric): Secondary | ICD-10-CM | POA: Diagnosis not present

## 2018-07-13 DIAGNOSIS — R1310 Dysphagia, unspecified: Secondary | ICD-10-CM | POA: Diagnosis not present

## 2018-07-13 DIAGNOSIS — R634 Abnormal weight loss: Secondary | ICD-10-CM | POA: Diagnosis not present

## 2018-07-13 DIAGNOSIS — H109 Unspecified conjunctivitis: Secondary | ICD-10-CM | POA: Diagnosis not present

## 2018-07-13 DIAGNOSIS — G20 Parkinson's disease: Secondary | ICD-10-CM | POA: Diagnosis not present

## 2018-07-13 DIAGNOSIS — I10 Essential (primary) hypertension: Secondary | ICD-10-CM | POA: Diagnosis not present

## 2018-07-13 DIAGNOSIS — F0390 Unspecified dementia without behavioral disturbance: Secondary | ICD-10-CM | POA: Diagnosis not present

## 2018-07-13 DIAGNOSIS — G7000 Myasthenia gravis without (acute) exacerbation: Secondary | ICD-10-CM | POA: Diagnosis not present

## 2018-07-14 DIAGNOSIS — R634 Abnormal weight loss: Secondary | ICD-10-CM | POA: Diagnosis not present

## 2018-07-14 DIAGNOSIS — G20 Parkinson's disease: Secondary | ICD-10-CM | POA: Diagnosis not present

## 2018-07-14 DIAGNOSIS — R1310 Dysphagia, unspecified: Secondary | ICD-10-CM | POA: Diagnosis not present

## 2018-07-14 DIAGNOSIS — Z9181 History of falling: Secondary | ICD-10-CM | POA: Diagnosis not present

## 2018-07-14 DIAGNOSIS — F0390 Unspecified dementia without behavioral disturbance: Secondary | ICD-10-CM | POA: Diagnosis not present

## 2018-07-14 DIAGNOSIS — G7000 Myasthenia gravis without (acute) exacerbation: Secondary | ICD-10-CM | POA: Diagnosis not present

## 2018-07-17 DIAGNOSIS — R634 Abnormal weight loss: Secondary | ICD-10-CM | POA: Diagnosis not present

## 2018-07-17 DIAGNOSIS — G7000 Myasthenia gravis without (acute) exacerbation: Secondary | ICD-10-CM | POA: Diagnosis not present

## 2018-07-17 DIAGNOSIS — Z9181 History of falling: Secondary | ICD-10-CM | POA: Diagnosis not present

## 2018-07-17 DIAGNOSIS — F0390 Unspecified dementia without behavioral disturbance: Secondary | ICD-10-CM | POA: Diagnosis not present

## 2018-07-17 DIAGNOSIS — G20 Parkinson's disease: Secondary | ICD-10-CM | POA: Diagnosis not present

## 2018-07-17 DIAGNOSIS — R1310 Dysphagia, unspecified: Secondary | ICD-10-CM | POA: Diagnosis not present

## 2018-07-18 DIAGNOSIS — E039 Hypothyroidism, unspecified: Secondary | ICD-10-CM | POA: Diagnosis not present

## 2018-07-18 DIAGNOSIS — F0391 Unspecified dementia with behavioral disturbance: Secondary | ICD-10-CM | POA: Diagnosis not present

## 2018-07-18 DIAGNOSIS — F329 Major depressive disorder, single episode, unspecified: Secondary | ICD-10-CM | POA: Diagnosis not present

## 2018-07-18 DIAGNOSIS — I10 Essential (primary) hypertension: Secondary | ICD-10-CM | POA: Diagnosis not present

## 2018-07-19 DIAGNOSIS — G7000 Myasthenia gravis without (acute) exacerbation: Secondary | ICD-10-CM | POA: Diagnosis not present

## 2018-07-19 DIAGNOSIS — Z9181 History of falling: Secondary | ICD-10-CM | POA: Diagnosis not present

## 2018-07-19 DIAGNOSIS — R634 Abnormal weight loss: Secondary | ICD-10-CM | POA: Diagnosis not present

## 2018-07-19 DIAGNOSIS — F0390 Unspecified dementia without behavioral disturbance: Secondary | ICD-10-CM | POA: Diagnosis not present

## 2018-07-19 DIAGNOSIS — R1310 Dysphagia, unspecified: Secondary | ICD-10-CM | POA: Diagnosis not present

## 2018-07-19 DIAGNOSIS — G20 Parkinson's disease: Secondary | ICD-10-CM | POA: Diagnosis not present

## 2018-07-20 DIAGNOSIS — R1310 Dysphagia, unspecified: Secondary | ICD-10-CM | POA: Diagnosis not present

## 2018-07-20 DIAGNOSIS — Z9181 History of falling: Secondary | ICD-10-CM | POA: Diagnosis not present

## 2018-07-20 DIAGNOSIS — F0390 Unspecified dementia without behavioral disturbance: Secondary | ICD-10-CM | POA: Diagnosis not present

## 2018-07-20 DIAGNOSIS — R634 Abnormal weight loss: Secondary | ICD-10-CM | POA: Diagnosis not present

## 2018-07-20 DIAGNOSIS — G20 Parkinson's disease: Secondary | ICD-10-CM | POA: Diagnosis not present

## 2018-07-20 DIAGNOSIS — G7000 Myasthenia gravis without (acute) exacerbation: Secondary | ICD-10-CM | POA: Diagnosis not present

## 2018-07-21 DIAGNOSIS — G7000 Myasthenia gravis without (acute) exacerbation: Secondary | ICD-10-CM | POA: Diagnosis not present

## 2018-07-21 DIAGNOSIS — Z9181 History of falling: Secondary | ICD-10-CM | POA: Diagnosis not present

## 2018-07-21 DIAGNOSIS — R1310 Dysphagia, unspecified: Secondary | ICD-10-CM | POA: Diagnosis not present

## 2018-07-21 DIAGNOSIS — F0390 Unspecified dementia without behavioral disturbance: Secondary | ICD-10-CM | POA: Diagnosis not present

## 2018-07-21 DIAGNOSIS — R634 Abnormal weight loss: Secondary | ICD-10-CM | POA: Diagnosis not present

## 2018-07-21 DIAGNOSIS — G20 Parkinson's disease: Secondary | ICD-10-CM | POA: Diagnosis not present

## 2018-07-24 DIAGNOSIS — Z9181 History of falling: Secondary | ICD-10-CM | POA: Diagnosis not present

## 2018-07-24 DIAGNOSIS — G20 Parkinson's disease: Secondary | ICD-10-CM | POA: Diagnosis not present

## 2018-07-24 DIAGNOSIS — R1310 Dysphagia, unspecified: Secondary | ICD-10-CM | POA: Diagnosis not present

## 2018-07-24 DIAGNOSIS — G7000 Myasthenia gravis without (acute) exacerbation: Secondary | ICD-10-CM | POA: Diagnosis not present

## 2018-07-24 DIAGNOSIS — R634 Abnormal weight loss: Secondary | ICD-10-CM | POA: Diagnosis not present

## 2018-07-24 DIAGNOSIS — F0390 Unspecified dementia without behavioral disturbance: Secondary | ICD-10-CM | POA: Diagnosis not present

## 2018-07-25 DIAGNOSIS — R634 Abnormal weight loss: Secondary | ICD-10-CM | POA: Diagnosis not present

## 2018-07-25 DIAGNOSIS — Z9181 History of falling: Secondary | ICD-10-CM | POA: Diagnosis not present

## 2018-07-25 DIAGNOSIS — F0390 Unspecified dementia without behavioral disturbance: Secondary | ICD-10-CM | POA: Diagnosis not present

## 2018-07-25 DIAGNOSIS — G7000 Myasthenia gravis without (acute) exacerbation: Secondary | ICD-10-CM | POA: Diagnosis not present

## 2018-07-25 DIAGNOSIS — R1310 Dysphagia, unspecified: Secondary | ICD-10-CM | POA: Diagnosis not present

## 2018-07-25 DIAGNOSIS — G20 Parkinson's disease: Secondary | ICD-10-CM | POA: Diagnosis not present

## 2018-07-26 DIAGNOSIS — R634 Abnormal weight loss: Secondary | ICD-10-CM | POA: Diagnosis not present

## 2018-07-26 DIAGNOSIS — G7000 Myasthenia gravis without (acute) exacerbation: Secondary | ICD-10-CM | POA: Diagnosis not present

## 2018-07-26 DIAGNOSIS — G20 Parkinson's disease: Secondary | ICD-10-CM | POA: Diagnosis not present

## 2018-07-26 DIAGNOSIS — F0390 Unspecified dementia without behavioral disturbance: Secondary | ICD-10-CM | POA: Diagnosis not present

## 2018-07-26 DIAGNOSIS — Z9181 History of falling: Secondary | ICD-10-CM | POA: Diagnosis not present

## 2018-07-26 DIAGNOSIS — R1310 Dysphagia, unspecified: Secondary | ICD-10-CM | POA: Diagnosis not present

## 2018-07-28 DIAGNOSIS — R1310 Dysphagia, unspecified: Secondary | ICD-10-CM | POA: Diagnosis not present

## 2018-07-28 DIAGNOSIS — R634 Abnormal weight loss: Secondary | ICD-10-CM | POA: Diagnosis not present

## 2018-07-28 DIAGNOSIS — F0390 Unspecified dementia without behavioral disturbance: Secondary | ICD-10-CM | POA: Diagnosis not present

## 2018-07-28 DIAGNOSIS — Z9181 History of falling: Secondary | ICD-10-CM | POA: Diagnosis not present

## 2018-07-28 DIAGNOSIS — G20 Parkinson's disease: Secondary | ICD-10-CM | POA: Diagnosis not present

## 2018-07-28 DIAGNOSIS — G7000 Myasthenia gravis without (acute) exacerbation: Secondary | ICD-10-CM | POA: Diagnosis not present

## 2018-07-31 DIAGNOSIS — R634 Abnormal weight loss: Secondary | ICD-10-CM | POA: Diagnosis not present

## 2018-07-31 DIAGNOSIS — G20 Parkinson's disease: Secondary | ICD-10-CM | POA: Diagnosis not present

## 2018-07-31 DIAGNOSIS — Z9181 History of falling: Secondary | ICD-10-CM | POA: Diagnosis not present

## 2018-07-31 DIAGNOSIS — G7000 Myasthenia gravis without (acute) exacerbation: Secondary | ICD-10-CM | POA: Diagnosis not present

## 2018-07-31 DIAGNOSIS — F0390 Unspecified dementia without behavioral disturbance: Secondary | ICD-10-CM | POA: Diagnosis not present

## 2018-07-31 DIAGNOSIS — R1310 Dysphagia, unspecified: Secondary | ICD-10-CM | POA: Diagnosis not present

## 2018-08-02 DIAGNOSIS — Z9181 History of falling: Secondary | ICD-10-CM | POA: Diagnosis not present

## 2018-08-02 DIAGNOSIS — F0390 Unspecified dementia without behavioral disturbance: Secondary | ICD-10-CM | POA: Diagnosis not present

## 2018-08-02 DIAGNOSIS — G20 Parkinson's disease: Secondary | ICD-10-CM | POA: Diagnosis not present

## 2018-08-02 DIAGNOSIS — R634 Abnormal weight loss: Secondary | ICD-10-CM | POA: Diagnosis not present

## 2018-08-02 DIAGNOSIS — R1310 Dysphagia, unspecified: Secondary | ICD-10-CM | POA: Diagnosis not present

## 2018-08-02 DIAGNOSIS — G7000 Myasthenia gravis without (acute) exacerbation: Secondary | ICD-10-CM | POA: Diagnosis not present

## 2018-08-03 DIAGNOSIS — Z9181 History of falling: Secondary | ICD-10-CM | POA: Diagnosis not present

## 2018-08-03 DIAGNOSIS — R1310 Dysphagia, unspecified: Secondary | ICD-10-CM | POA: Diagnosis not present

## 2018-08-03 DIAGNOSIS — R634 Abnormal weight loss: Secondary | ICD-10-CM | POA: Diagnosis not present

## 2018-08-03 DIAGNOSIS — G7000 Myasthenia gravis without (acute) exacerbation: Secondary | ICD-10-CM | POA: Diagnosis not present

## 2018-08-03 DIAGNOSIS — F0390 Unspecified dementia without behavioral disturbance: Secondary | ICD-10-CM | POA: Diagnosis not present

## 2018-08-03 DIAGNOSIS — G20 Parkinson's disease: Secondary | ICD-10-CM | POA: Diagnosis not present

## 2018-08-04 DIAGNOSIS — R634 Abnormal weight loss: Secondary | ICD-10-CM | POA: Diagnosis not present

## 2018-08-04 DIAGNOSIS — G20 Parkinson's disease: Secondary | ICD-10-CM | POA: Diagnosis not present

## 2018-08-04 DIAGNOSIS — F0390 Unspecified dementia without behavioral disturbance: Secondary | ICD-10-CM | POA: Diagnosis not present

## 2018-08-04 DIAGNOSIS — G7000 Myasthenia gravis without (acute) exacerbation: Secondary | ICD-10-CM | POA: Diagnosis not present

## 2018-08-04 DIAGNOSIS — Z9181 History of falling: Secondary | ICD-10-CM | POA: Diagnosis not present

## 2018-08-04 DIAGNOSIS — R1310 Dysphagia, unspecified: Secondary | ICD-10-CM | POA: Diagnosis not present

## 2018-08-07 DIAGNOSIS — Z9181 History of falling: Secondary | ICD-10-CM | POA: Diagnosis not present

## 2018-08-07 DIAGNOSIS — F0390 Unspecified dementia without behavioral disturbance: Secondary | ICD-10-CM | POA: Diagnosis not present

## 2018-08-07 DIAGNOSIS — R1310 Dysphagia, unspecified: Secondary | ICD-10-CM | POA: Diagnosis not present

## 2018-08-07 DIAGNOSIS — G7000 Myasthenia gravis without (acute) exacerbation: Secondary | ICD-10-CM | POA: Diagnosis not present

## 2018-08-07 DIAGNOSIS — R634 Abnormal weight loss: Secondary | ICD-10-CM | POA: Diagnosis not present

## 2018-08-07 DIAGNOSIS — G20 Parkinson's disease: Secondary | ICD-10-CM | POA: Diagnosis not present

## 2018-08-08 DIAGNOSIS — F0390 Unspecified dementia without behavioral disturbance: Secondary | ICD-10-CM | POA: Diagnosis not present

## 2018-08-08 DIAGNOSIS — G7000 Myasthenia gravis without (acute) exacerbation: Secondary | ICD-10-CM | POA: Diagnosis not present

## 2018-08-08 DIAGNOSIS — K219 Gastro-esophageal reflux disease without esophagitis: Secondary | ICD-10-CM | POA: Diagnosis not present

## 2018-08-08 DIAGNOSIS — M1990 Unspecified osteoarthritis, unspecified site: Secondary | ICD-10-CM | POA: Diagnosis not present

## 2018-08-09 DIAGNOSIS — Z9181 History of falling: Secondary | ICD-10-CM | POA: Diagnosis not present

## 2018-08-09 DIAGNOSIS — G20 Parkinson's disease: Secondary | ICD-10-CM | POA: Diagnosis not present

## 2018-08-09 DIAGNOSIS — R1310 Dysphagia, unspecified: Secondary | ICD-10-CM | POA: Diagnosis not present

## 2018-08-09 DIAGNOSIS — R634 Abnormal weight loss: Secondary | ICD-10-CM | POA: Diagnosis not present

## 2018-08-09 DIAGNOSIS — F0390 Unspecified dementia without behavioral disturbance: Secondary | ICD-10-CM | POA: Diagnosis not present

## 2018-08-09 DIAGNOSIS — G7000 Myasthenia gravis without (acute) exacerbation: Secondary | ICD-10-CM | POA: Diagnosis not present

## 2018-08-10 DIAGNOSIS — R1310 Dysphagia, unspecified: Secondary | ICD-10-CM | POA: Diagnosis not present

## 2018-08-10 DIAGNOSIS — G7000 Myasthenia gravis without (acute) exacerbation: Secondary | ICD-10-CM | POA: Diagnosis not present

## 2018-08-10 DIAGNOSIS — F0390 Unspecified dementia without behavioral disturbance: Secondary | ICD-10-CM | POA: Diagnosis not present

## 2018-08-10 DIAGNOSIS — R634 Abnormal weight loss: Secondary | ICD-10-CM | POA: Diagnosis not present

## 2018-08-10 DIAGNOSIS — G20 Parkinson's disease: Secondary | ICD-10-CM | POA: Diagnosis not present

## 2018-08-10 DIAGNOSIS — Z9181 History of falling: Secondary | ICD-10-CM | POA: Diagnosis not present

## 2018-08-11 DIAGNOSIS — R634 Abnormal weight loss: Secondary | ICD-10-CM | POA: Diagnosis not present

## 2018-08-11 DIAGNOSIS — G20 Parkinson's disease: Secondary | ICD-10-CM | POA: Diagnosis not present

## 2018-08-11 DIAGNOSIS — F0390 Unspecified dementia without behavioral disturbance: Secondary | ICD-10-CM | POA: Diagnosis not present

## 2018-08-11 DIAGNOSIS — R1310 Dysphagia, unspecified: Secondary | ICD-10-CM | POA: Diagnosis not present

## 2018-08-11 DIAGNOSIS — G7000 Myasthenia gravis without (acute) exacerbation: Secondary | ICD-10-CM | POA: Diagnosis not present

## 2018-08-11 DIAGNOSIS — Z9181 History of falling: Secondary | ICD-10-CM | POA: Diagnosis not present

## 2018-08-14 DIAGNOSIS — G20 Parkinson's disease: Secondary | ICD-10-CM | POA: Diagnosis not present

## 2018-08-14 DIAGNOSIS — F0390 Unspecified dementia without behavioral disturbance: Secondary | ICD-10-CM | POA: Diagnosis not present

## 2018-08-14 DIAGNOSIS — R634 Abnormal weight loss: Secondary | ICD-10-CM | POA: Diagnosis not present

## 2018-08-14 DIAGNOSIS — Z9181 History of falling: Secondary | ICD-10-CM | POA: Diagnosis not present

## 2018-08-14 DIAGNOSIS — G7000 Myasthenia gravis without (acute) exacerbation: Secondary | ICD-10-CM | POA: Diagnosis not present

## 2018-08-14 DIAGNOSIS — R1310 Dysphagia, unspecified: Secondary | ICD-10-CM | POA: Diagnosis not present

## 2018-08-16 DIAGNOSIS — R634 Abnormal weight loss: Secondary | ICD-10-CM | POA: Diagnosis not present

## 2018-08-16 DIAGNOSIS — F0390 Unspecified dementia without behavioral disturbance: Secondary | ICD-10-CM | POA: Diagnosis not present

## 2018-08-16 DIAGNOSIS — G7000 Myasthenia gravis without (acute) exacerbation: Secondary | ICD-10-CM | POA: Diagnosis not present

## 2018-08-16 DIAGNOSIS — Z9181 History of falling: Secondary | ICD-10-CM | POA: Diagnosis not present

## 2018-08-16 DIAGNOSIS — R1310 Dysphagia, unspecified: Secondary | ICD-10-CM | POA: Diagnosis not present

## 2018-08-16 DIAGNOSIS — G20 Parkinson's disease: Secondary | ICD-10-CM | POA: Diagnosis not present

## 2018-08-17 DIAGNOSIS — G7000 Myasthenia gravis without (acute) exacerbation: Secondary | ICD-10-CM | POA: Diagnosis not present

## 2018-08-17 DIAGNOSIS — F0391 Unspecified dementia with behavioral disturbance: Secondary | ICD-10-CM | POA: Diagnosis not present

## 2018-08-17 DIAGNOSIS — G20 Parkinson's disease: Secondary | ICD-10-CM | POA: Diagnosis not present

## 2018-08-17 DIAGNOSIS — Z9181 History of falling: Secondary | ICD-10-CM | POA: Diagnosis not present

## 2018-08-17 DIAGNOSIS — R1310 Dysphagia, unspecified: Secondary | ICD-10-CM | POA: Diagnosis not present

## 2018-08-17 DIAGNOSIS — F0390 Unspecified dementia without behavioral disturbance: Secondary | ICD-10-CM | POA: Diagnosis not present

## 2018-08-17 DIAGNOSIS — R634 Abnormal weight loss: Secondary | ICD-10-CM | POA: Diagnosis not present

## 2018-08-18 DIAGNOSIS — G7000 Myasthenia gravis without (acute) exacerbation: Secondary | ICD-10-CM | POA: Diagnosis not present

## 2018-08-18 DIAGNOSIS — F0390 Unspecified dementia without behavioral disturbance: Secondary | ICD-10-CM | POA: Diagnosis not present

## 2018-08-18 DIAGNOSIS — R1310 Dysphagia, unspecified: Secondary | ICD-10-CM | POA: Diagnosis not present

## 2018-08-18 DIAGNOSIS — G20 Parkinson's disease: Secondary | ICD-10-CM | POA: Diagnosis not present

## 2018-08-18 DIAGNOSIS — R634 Abnormal weight loss: Secondary | ICD-10-CM | POA: Diagnosis not present

## 2018-08-18 DIAGNOSIS — Z9181 History of falling: Secondary | ICD-10-CM | POA: Diagnosis not present

## 2018-08-21 DIAGNOSIS — F0390 Unspecified dementia without behavioral disturbance: Secondary | ICD-10-CM | POA: Diagnosis not present

## 2018-08-21 DIAGNOSIS — R1310 Dysphagia, unspecified: Secondary | ICD-10-CM | POA: Diagnosis not present

## 2018-08-21 DIAGNOSIS — G20 Parkinson's disease: Secondary | ICD-10-CM | POA: Diagnosis not present

## 2018-08-21 DIAGNOSIS — R634 Abnormal weight loss: Secondary | ICD-10-CM | POA: Diagnosis not present

## 2018-08-21 DIAGNOSIS — G7000 Myasthenia gravis without (acute) exacerbation: Secondary | ICD-10-CM | POA: Diagnosis not present

## 2018-08-21 DIAGNOSIS — Z9181 History of falling: Secondary | ICD-10-CM | POA: Diagnosis not present

## 2018-08-23 DIAGNOSIS — R634 Abnormal weight loss: Secondary | ICD-10-CM | POA: Diagnosis not present

## 2018-08-23 DIAGNOSIS — R1310 Dysphagia, unspecified: Secondary | ICD-10-CM | POA: Diagnosis not present

## 2018-08-23 DIAGNOSIS — F0390 Unspecified dementia without behavioral disturbance: Secondary | ICD-10-CM | POA: Diagnosis not present

## 2018-08-23 DIAGNOSIS — Z9181 History of falling: Secondary | ICD-10-CM | POA: Diagnosis not present

## 2018-08-23 DIAGNOSIS — G20 Parkinson's disease: Secondary | ICD-10-CM | POA: Diagnosis not present

## 2018-08-23 DIAGNOSIS — G7000 Myasthenia gravis without (acute) exacerbation: Secondary | ICD-10-CM | POA: Diagnosis not present

## 2018-08-24 DIAGNOSIS — R1310 Dysphagia, unspecified: Secondary | ICD-10-CM | POA: Diagnosis not present

## 2018-08-24 DIAGNOSIS — Z9181 History of falling: Secondary | ICD-10-CM | POA: Diagnosis not present

## 2018-08-24 DIAGNOSIS — R634 Abnormal weight loss: Secondary | ICD-10-CM | POA: Diagnosis not present

## 2018-08-24 DIAGNOSIS — G7000 Myasthenia gravis without (acute) exacerbation: Secondary | ICD-10-CM | POA: Diagnosis not present

## 2018-08-24 DIAGNOSIS — G20 Parkinson's disease: Secondary | ICD-10-CM | POA: Diagnosis not present

## 2018-08-24 DIAGNOSIS — F0390 Unspecified dementia without behavioral disturbance: Secondary | ICD-10-CM | POA: Diagnosis not present

## 2018-08-25 DIAGNOSIS — G20 Parkinson's disease: Secondary | ICD-10-CM | POA: Diagnosis not present

## 2018-08-25 DIAGNOSIS — F0390 Unspecified dementia without behavioral disturbance: Secondary | ICD-10-CM | POA: Diagnosis not present

## 2018-08-25 DIAGNOSIS — Z9181 History of falling: Secondary | ICD-10-CM | POA: Diagnosis not present

## 2018-08-25 DIAGNOSIS — G7000 Myasthenia gravis without (acute) exacerbation: Secondary | ICD-10-CM | POA: Diagnosis not present

## 2018-08-25 DIAGNOSIS — R1310 Dysphagia, unspecified: Secondary | ICD-10-CM | POA: Diagnosis not present

## 2018-08-25 DIAGNOSIS — R634 Abnormal weight loss: Secondary | ICD-10-CM | POA: Diagnosis not present

## 2018-08-28 DIAGNOSIS — G7000 Myasthenia gravis without (acute) exacerbation: Secondary | ICD-10-CM | POA: Diagnosis not present

## 2018-08-28 DIAGNOSIS — Z9181 History of falling: Secondary | ICD-10-CM | POA: Diagnosis not present

## 2018-08-28 DIAGNOSIS — G20 Parkinson's disease: Secondary | ICD-10-CM | POA: Diagnosis not present

## 2018-08-28 DIAGNOSIS — R634 Abnormal weight loss: Secondary | ICD-10-CM | POA: Diagnosis not present

## 2018-08-28 DIAGNOSIS — F0390 Unspecified dementia without behavioral disturbance: Secondary | ICD-10-CM | POA: Diagnosis not present

## 2018-08-28 DIAGNOSIS — R1310 Dysphagia, unspecified: Secondary | ICD-10-CM | POA: Diagnosis not present

## 2018-08-30 DIAGNOSIS — G7000 Myasthenia gravis without (acute) exacerbation: Secondary | ICD-10-CM | POA: Diagnosis not present

## 2018-08-30 DIAGNOSIS — R634 Abnormal weight loss: Secondary | ICD-10-CM | POA: Diagnosis not present

## 2018-08-30 DIAGNOSIS — F0390 Unspecified dementia without behavioral disturbance: Secondary | ICD-10-CM | POA: Diagnosis not present

## 2018-08-30 DIAGNOSIS — Z9181 History of falling: Secondary | ICD-10-CM | POA: Diagnosis not present

## 2018-08-30 DIAGNOSIS — R1310 Dysphagia, unspecified: Secondary | ICD-10-CM | POA: Diagnosis not present

## 2018-08-30 DIAGNOSIS — G20 Parkinson's disease: Secondary | ICD-10-CM | POA: Diagnosis not present

## 2018-08-31 DIAGNOSIS — G20 Parkinson's disease: Secondary | ICD-10-CM | POA: Diagnosis not present

## 2018-08-31 DIAGNOSIS — R634 Abnormal weight loss: Secondary | ICD-10-CM | POA: Diagnosis not present

## 2018-08-31 DIAGNOSIS — F0390 Unspecified dementia without behavioral disturbance: Secondary | ICD-10-CM | POA: Diagnosis not present

## 2018-08-31 DIAGNOSIS — R1310 Dysphagia, unspecified: Secondary | ICD-10-CM | POA: Diagnosis not present

## 2018-08-31 DIAGNOSIS — Z9181 History of falling: Secondary | ICD-10-CM | POA: Diagnosis not present

## 2018-08-31 DIAGNOSIS — G7000 Myasthenia gravis without (acute) exacerbation: Secondary | ICD-10-CM | POA: Diagnosis not present

## 2018-09-01 DIAGNOSIS — G20 Parkinson's disease: Secondary | ICD-10-CM | POA: Diagnosis not present

## 2018-09-01 DIAGNOSIS — R634 Abnormal weight loss: Secondary | ICD-10-CM | POA: Diagnosis not present

## 2018-09-01 DIAGNOSIS — F0390 Unspecified dementia without behavioral disturbance: Secondary | ICD-10-CM | POA: Diagnosis not present

## 2018-09-01 DIAGNOSIS — Z9181 History of falling: Secondary | ICD-10-CM | POA: Diagnosis not present

## 2018-09-01 DIAGNOSIS — G7000 Myasthenia gravis without (acute) exacerbation: Secondary | ICD-10-CM | POA: Diagnosis not present

## 2018-09-01 DIAGNOSIS — R1310 Dysphagia, unspecified: Secondary | ICD-10-CM | POA: Diagnosis not present

## 2018-09-04 DIAGNOSIS — F0390 Unspecified dementia without behavioral disturbance: Secondary | ICD-10-CM | POA: Diagnosis not present

## 2018-09-04 DIAGNOSIS — R634 Abnormal weight loss: Secondary | ICD-10-CM | POA: Diagnosis not present

## 2018-09-04 DIAGNOSIS — G20 Parkinson's disease: Secondary | ICD-10-CM | POA: Diagnosis not present

## 2018-09-04 DIAGNOSIS — G7000 Myasthenia gravis without (acute) exacerbation: Secondary | ICD-10-CM | POA: Diagnosis not present

## 2018-09-04 DIAGNOSIS — R1310 Dysphagia, unspecified: Secondary | ICD-10-CM | POA: Diagnosis not present

## 2018-09-04 DIAGNOSIS — Z9181 History of falling: Secondary | ICD-10-CM | POA: Diagnosis not present

## 2018-09-05 DIAGNOSIS — K219 Gastro-esophageal reflux disease without esophagitis: Secondary | ICD-10-CM | POA: Diagnosis not present

## 2018-09-05 DIAGNOSIS — G7000 Myasthenia gravis without (acute) exacerbation: Secondary | ICD-10-CM | POA: Diagnosis not present

## 2018-09-05 DIAGNOSIS — F0390 Unspecified dementia without behavioral disturbance: Secondary | ICD-10-CM | POA: Diagnosis not present

## 2018-09-05 DIAGNOSIS — M1990 Unspecified osteoarthritis, unspecified site: Secondary | ICD-10-CM | POA: Diagnosis not present

## 2018-09-06 DIAGNOSIS — G20 Parkinson's disease: Secondary | ICD-10-CM | POA: Diagnosis not present

## 2018-09-06 DIAGNOSIS — R634 Abnormal weight loss: Secondary | ICD-10-CM | POA: Diagnosis not present

## 2018-09-06 DIAGNOSIS — G7000 Myasthenia gravis without (acute) exacerbation: Secondary | ICD-10-CM | POA: Diagnosis not present

## 2018-09-06 DIAGNOSIS — R1310 Dysphagia, unspecified: Secondary | ICD-10-CM | POA: Diagnosis not present

## 2018-09-06 DIAGNOSIS — Z9181 History of falling: Secondary | ICD-10-CM | POA: Diagnosis not present

## 2018-09-06 DIAGNOSIS — F0390 Unspecified dementia without behavioral disturbance: Secondary | ICD-10-CM | POA: Diagnosis not present

## 2018-09-08 DIAGNOSIS — F0390 Unspecified dementia without behavioral disturbance: Secondary | ICD-10-CM | POA: Diagnosis not present

## 2018-09-08 DIAGNOSIS — G7000 Myasthenia gravis without (acute) exacerbation: Secondary | ICD-10-CM | POA: Diagnosis not present

## 2018-09-08 DIAGNOSIS — R634 Abnormal weight loss: Secondary | ICD-10-CM | POA: Diagnosis not present

## 2018-09-08 DIAGNOSIS — G20 Parkinson's disease: Secondary | ICD-10-CM | POA: Diagnosis not present

## 2018-09-08 DIAGNOSIS — Z9181 History of falling: Secondary | ICD-10-CM | POA: Diagnosis not present

## 2018-09-08 DIAGNOSIS — R1310 Dysphagia, unspecified: Secondary | ICD-10-CM | POA: Diagnosis not present

## 2018-09-10 DIAGNOSIS — R1310 Dysphagia, unspecified: Secondary | ICD-10-CM | POA: Diagnosis not present

## 2018-09-10 DIAGNOSIS — G7000 Myasthenia gravis without (acute) exacerbation: Secondary | ICD-10-CM | POA: Diagnosis not present

## 2018-09-10 DIAGNOSIS — R634 Abnormal weight loss: Secondary | ICD-10-CM | POA: Diagnosis not present

## 2018-09-10 DIAGNOSIS — Z9181 History of falling: Secondary | ICD-10-CM | POA: Diagnosis not present

## 2018-09-10 DIAGNOSIS — G20 Parkinson's disease: Secondary | ICD-10-CM | POA: Diagnosis not present

## 2018-09-10 DIAGNOSIS — F0390 Unspecified dementia without behavioral disturbance: Secondary | ICD-10-CM | POA: Diagnosis not present

## 2018-09-11 DIAGNOSIS — Z9181 History of falling: Secondary | ICD-10-CM | POA: Diagnosis not present

## 2018-09-11 DIAGNOSIS — G20 Parkinson's disease: Secondary | ICD-10-CM | POA: Diagnosis not present

## 2018-09-11 DIAGNOSIS — F0390 Unspecified dementia without behavioral disturbance: Secondary | ICD-10-CM | POA: Diagnosis not present

## 2018-09-11 DIAGNOSIS — R1310 Dysphagia, unspecified: Secondary | ICD-10-CM | POA: Diagnosis not present

## 2018-09-11 DIAGNOSIS — R634 Abnormal weight loss: Secondary | ICD-10-CM | POA: Diagnosis not present

## 2018-09-11 DIAGNOSIS — G7000 Myasthenia gravis without (acute) exacerbation: Secondary | ICD-10-CM | POA: Diagnosis not present

## 2018-09-13 DIAGNOSIS — G7000 Myasthenia gravis without (acute) exacerbation: Secondary | ICD-10-CM | POA: Diagnosis not present

## 2018-09-13 DIAGNOSIS — F0390 Unspecified dementia without behavioral disturbance: Secondary | ICD-10-CM | POA: Diagnosis not present

## 2018-09-13 DIAGNOSIS — Z9181 History of falling: Secondary | ICD-10-CM | POA: Diagnosis not present

## 2018-09-13 DIAGNOSIS — G20 Parkinson's disease: Secondary | ICD-10-CM | POA: Diagnosis not present

## 2018-09-13 DIAGNOSIS — R1310 Dysphagia, unspecified: Secondary | ICD-10-CM | POA: Diagnosis not present

## 2018-09-13 DIAGNOSIS — R634 Abnormal weight loss: Secondary | ICD-10-CM | POA: Diagnosis not present

## 2018-09-14 DIAGNOSIS — G20 Parkinson's disease: Secondary | ICD-10-CM | POA: Diagnosis not present

## 2018-09-14 DIAGNOSIS — R634 Abnormal weight loss: Secondary | ICD-10-CM | POA: Diagnosis not present

## 2018-09-14 DIAGNOSIS — F0390 Unspecified dementia without behavioral disturbance: Secondary | ICD-10-CM | POA: Diagnosis not present

## 2018-09-14 DIAGNOSIS — Z9181 History of falling: Secondary | ICD-10-CM | POA: Diagnosis not present

## 2018-09-14 DIAGNOSIS — R1310 Dysphagia, unspecified: Secondary | ICD-10-CM | POA: Diagnosis not present

## 2018-09-14 DIAGNOSIS — G7000 Myasthenia gravis without (acute) exacerbation: Secondary | ICD-10-CM | POA: Diagnosis not present

## 2018-09-15 DIAGNOSIS — F0390 Unspecified dementia without behavioral disturbance: Secondary | ICD-10-CM | POA: Diagnosis not present

## 2018-09-15 DIAGNOSIS — G7000 Myasthenia gravis without (acute) exacerbation: Secondary | ICD-10-CM | POA: Diagnosis not present

## 2018-09-15 DIAGNOSIS — R1310 Dysphagia, unspecified: Secondary | ICD-10-CM | POA: Diagnosis not present

## 2018-09-15 DIAGNOSIS — Z9181 History of falling: Secondary | ICD-10-CM | POA: Diagnosis not present

## 2018-09-15 DIAGNOSIS — G20 Parkinson's disease: Secondary | ICD-10-CM | POA: Diagnosis not present

## 2018-09-15 DIAGNOSIS — R634 Abnormal weight loss: Secondary | ICD-10-CM | POA: Diagnosis not present

## 2018-09-18 DIAGNOSIS — F0390 Unspecified dementia without behavioral disturbance: Secondary | ICD-10-CM | POA: Diagnosis not present

## 2018-09-18 DIAGNOSIS — G20 Parkinson's disease: Secondary | ICD-10-CM | POA: Diagnosis not present

## 2018-09-18 DIAGNOSIS — G7000 Myasthenia gravis without (acute) exacerbation: Secondary | ICD-10-CM | POA: Diagnosis not present

## 2018-09-18 DIAGNOSIS — Z9181 History of falling: Secondary | ICD-10-CM | POA: Diagnosis not present

## 2018-09-18 DIAGNOSIS — R1310 Dysphagia, unspecified: Secondary | ICD-10-CM | POA: Diagnosis not present

## 2018-09-18 DIAGNOSIS — R634 Abnormal weight loss: Secondary | ICD-10-CM | POA: Diagnosis not present

## 2018-09-20 DIAGNOSIS — Z9181 History of falling: Secondary | ICD-10-CM | POA: Diagnosis not present

## 2018-09-20 DIAGNOSIS — R1310 Dysphagia, unspecified: Secondary | ICD-10-CM | POA: Diagnosis not present

## 2018-09-20 DIAGNOSIS — R634 Abnormal weight loss: Secondary | ICD-10-CM | POA: Diagnosis not present

## 2018-09-20 DIAGNOSIS — G20 Parkinson's disease: Secondary | ICD-10-CM | POA: Diagnosis not present

## 2018-09-20 DIAGNOSIS — F0390 Unspecified dementia without behavioral disturbance: Secondary | ICD-10-CM | POA: Diagnosis not present

## 2018-09-20 DIAGNOSIS — G7000 Myasthenia gravis without (acute) exacerbation: Secondary | ICD-10-CM | POA: Diagnosis not present

## 2018-09-21 DIAGNOSIS — F0390 Unspecified dementia without behavioral disturbance: Secondary | ICD-10-CM | POA: Diagnosis not present

## 2018-09-21 DIAGNOSIS — R1310 Dysphagia, unspecified: Secondary | ICD-10-CM | POA: Diagnosis not present

## 2018-09-21 DIAGNOSIS — Z9181 History of falling: Secondary | ICD-10-CM | POA: Diagnosis not present

## 2018-09-21 DIAGNOSIS — G7000 Myasthenia gravis without (acute) exacerbation: Secondary | ICD-10-CM | POA: Diagnosis not present

## 2018-09-21 DIAGNOSIS — G20 Parkinson's disease: Secondary | ICD-10-CM | POA: Diagnosis not present

## 2018-09-21 DIAGNOSIS — R634 Abnormal weight loss: Secondary | ICD-10-CM | POA: Diagnosis not present

## 2018-09-22 DIAGNOSIS — R634 Abnormal weight loss: Secondary | ICD-10-CM | POA: Diagnosis not present

## 2018-09-22 DIAGNOSIS — G20 Parkinson's disease: Secondary | ICD-10-CM | POA: Diagnosis not present

## 2018-09-22 DIAGNOSIS — Z9181 History of falling: Secondary | ICD-10-CM | POA: Diagnosis not present

## 2018-09-22 DIAGNOSIS — R1310 Dysphagia, unspecified: Secondary | ICD-10-CM | POA: Diagnosis not present

## 2018-09-22 DIAGNOSIS — G7000 Myasthenia gravis without (acute) exacerbation: Secondary | ICD-10-CM | POA: Diagnosis not present

## 2018-09-22 DIAGNOSIS — F0390 Unspecified dementia without behavioral disturbance: Secondary | ICD-10-CM | POA: Diagnosis not present

## 2018-09-25 DIAGNOSIS — Z9181 History of falling: Secondary | ICD-10-CM | POA: Diagnosis not present

## 2018-09-25 DIAGNOSIS — F0390 Unspecified dementia without behavioral disturbance: Secondary | ICD-10-CM | POA: Diagnosis not present

## 2018-09-25 DIAGNOSIS — R1310 Dysphagia, unspecified: Secondary | ICD-10-CM | POA: Diagnosis not present

## 2018-09-25 DIAGNOSIS — G7000 Myasthenia gravis without (acute) exacerbation: Secondary | ICD-10-CM | POA: Diagnosis not present

## 2018-09-25 DIAGNOSIS — R634 Abnormal weight loss: Secondary | ICD-10-CM | POA: Diagnosis not present

## 2018-09-25 DIAGNOSIS — G20 Parkinson's disease: Secondary | ICD-10-CM | POA: Diagnosis not present

## 2018-09-26 DIAGNOSIS — K219 Gastro-esophageal reflux disease without esophagitis: Secondary | ICD-10-CM | POA: Diagnosis not present

## 2018-09-26 DIAGNOSIS — G7000 Myasthenia gravis without (acute) exacerbation: Secondary | ICD-10-CM | POA: Diagnosis not present

## 2018-09-26 DIAGNOSIS — I739 Peripheral vascular disease, unspecified: Secondary | ICD-10-CM | POA: Diagnosis not present

## 2018-09-26 DIAGNOSIS — I10 Essential (primary) hypertension: Secondary | ICD-10-CM | POA: Diagnosis not present

## 2018-09-27 ENCOUNTER — Ambulatory Visit: Payer: Medicare Other | Admitting: Nurse Practitioner

## 2018-09-27 DIAGNOSIS — G7000 Myasthenia gravis without (acute) exacerbation: Secondary | ICD-10-CM | POA: Diagnosis not present

## 2018-09-27 DIAGNOSIS — F0390 Unspecified dementia without behavioral disturbance: Secondary | ICD-10-CM | POA: Diagnosis not present

## 2018-09-27 DIAGNOSIS — R1310 Dysphagia, unspecified: Secondary | ICD-10-CM | POA: Diagnosis not present

## 2018-09-27 DIAGNOSIS — R634 Abnormal weight loss: Secondary | ICD-10-CM | POA: Diagnosis not present

## 2018-09-27 DIAGNOSIS — Z9181 History of falling: Secondary | ICD-10-CM | POA: Diagnosis not present

## 2018-09-27 DIAGNOSIS — G20 Parkinson's disease: Secondary | ICD-10-CM | POA: Diagnosis not present

## 2018-09-28 DIAGNOSIS — R634 Abnormal weight loss: Secondary | ICD-10-CM | POA: Diagnosis not present

## 2018-09-28 DIAGNOSIS — G7000 Myasthenia gravis without (acute) exacerbation: Secondary | ICD-10-CM | POA: Diagnosis not present

## 2018-09-28 DIAGNOSIS — G20 Parkinson's disease: Secondary | ICD-10-CM | POA: Diagnosis not present

## 2018-09-28 DIAGNOSIS — R1310 Dysphagia, unspecified: Secondary | ICD-10-CM | POA: Diagnosis not present

## 2018-09-28 DIAGNOSIS — F0390 Unspecified dementia without behavioral disturbance: Secondary | ICD-10-CM | POA: Diagnosis not present

## 2018-09-28 DIAGNOSIS — Z9181 History of falling: Secondary | ICD-10-CM | POA: Diagnosis not present

## 2018-09-29 DIAGNOSIS — G20 Parkinson's disease: Secondary | ICD-10-CM | POA: Diagnosis not present

## 2018-09-29 DIAGNOSIS — F0390 Unspecified dementia without behavioral disturbance: Secondary | ICD-10-CM | POA: Diagnosis not present

## 2018-09-29 DIAGNOSIS — R634 Abnormal weight loss: Secondary | ICD-10-CM | POA: Diagnosis not present

## 2018-09-29 DIAGNOSIS — Z9181 History of falling: Secondary | ICD-10-CM | POA: Diagnosis not present

## 2018-09-29 DIAGNOSIS — G7000 Myasthenia gravis without (acute) exacerbation: Secondary | ICD-10-CM | POA: Diagnosis not present

## 2018-09-29 DIAGNOSIS — R1310 Dysphagia, unspecified: Secondary | ICD-10-CM | POA: Diagnosis not present

## 2018-09-29 IMAGING — MR MR HEAD W/O CM
8 of 10 series · 37 of 48 positions shown · non-contrast
Comparison: MRI of the head December 19, 2013

CLINICAL DATA: Dementia, gait disturbance and general weakness with
decreased muscle tone. Altered mental status for 3 weeks. Ortho
static hypotension.

EXAM:
MRI HEAD WITHOUT CONTRAST
TECHNIQUE: Multiplanar, multiecho pulse sequences of the brain and surrounding
structures were obtained without intravenous contrast.

[Series 4: T1 · sagittal · 5.0mm · 0.47mm/px · 2 of 24 slices shown]
[im 1/24]
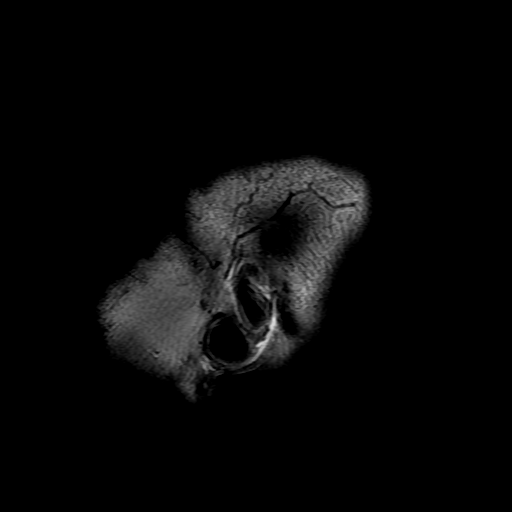
[im 24/24]
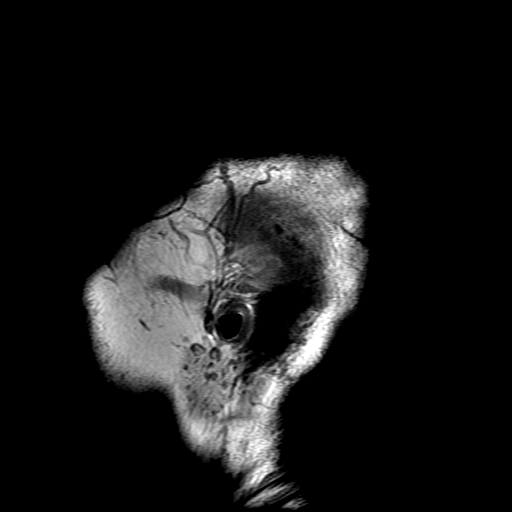

[Series 5: DWI · axial · 3.0mm · 1.09mm/px · z∈[-109,+47]mm · 8 of 108 slices shown (1 of 4)]
[im 1/108]
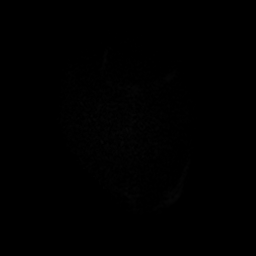
[im 12/108]
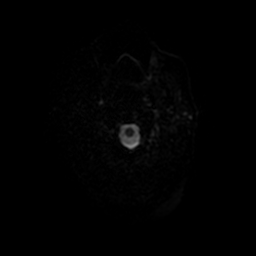
[im 36/108]
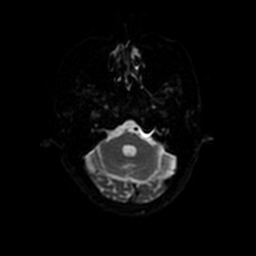
[im 48/108]
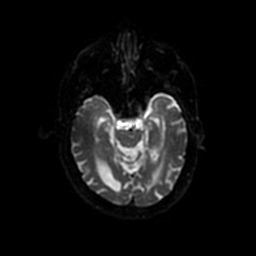
[im 60/108]
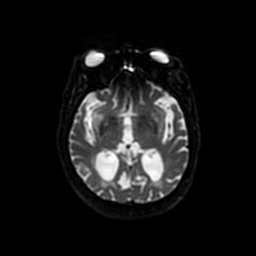
[im 72/108]
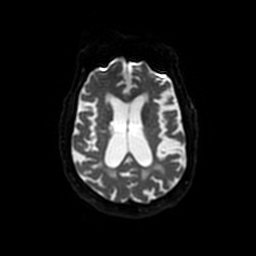
[im 96/108]
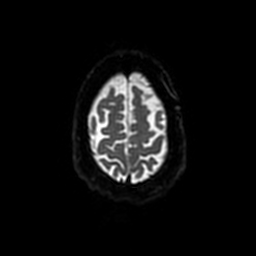
[im 108/108]
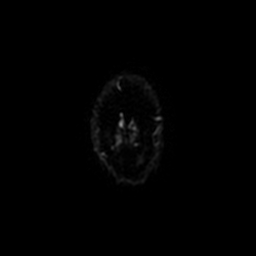

[Series 6: DWI · coronal · 5.0mm · 1.09mm/px · 8 of 72 slices shown (2 of 4)]
[im 1/72]
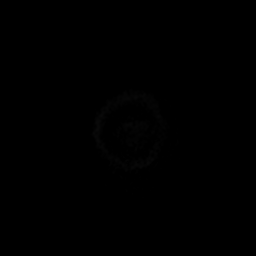
[im 11/72]
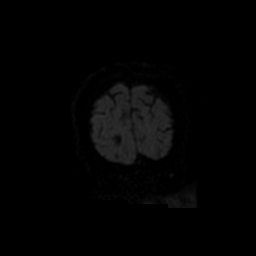
[im 21/72]
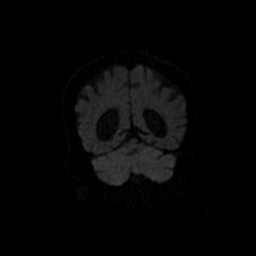
[im 31/72]
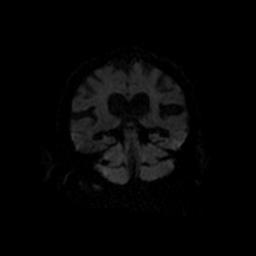
[im 41/72]
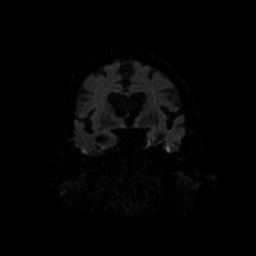
[im 51/72]
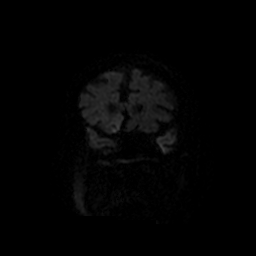
[im 61/72]
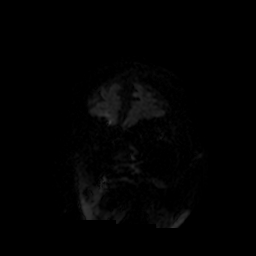
[im 72/72]
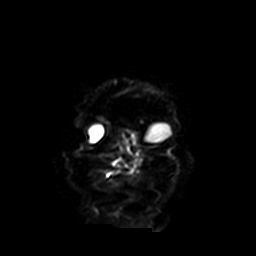

[Series 7: T2 · axial · 5.0mm · 0.43mm/px · z∈[-110,+49]mm · 3 of 24 slices shown (1 of 2)]
[im 1/24]
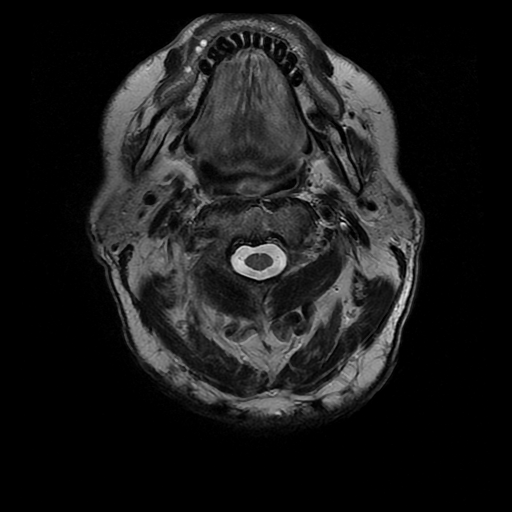
[im 12/24]
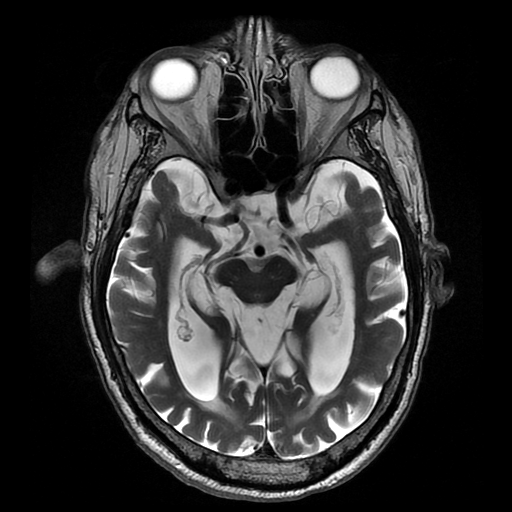
[im 24/24]
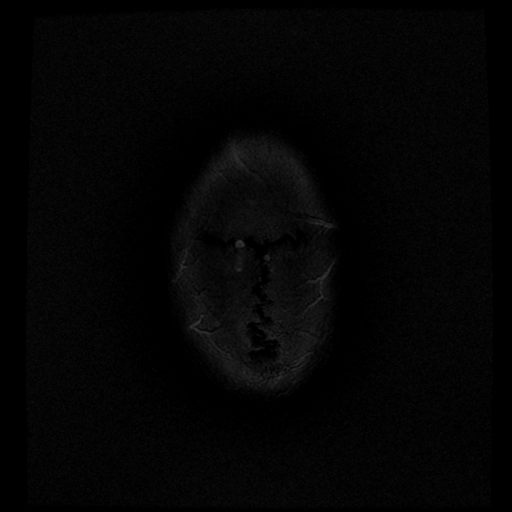

[Series 8: FLAIR · axial · 3.0mm · 0.43mm/px · z∈[-110,+49]mm · 3 of 28 slices shown]
[im 1/28]
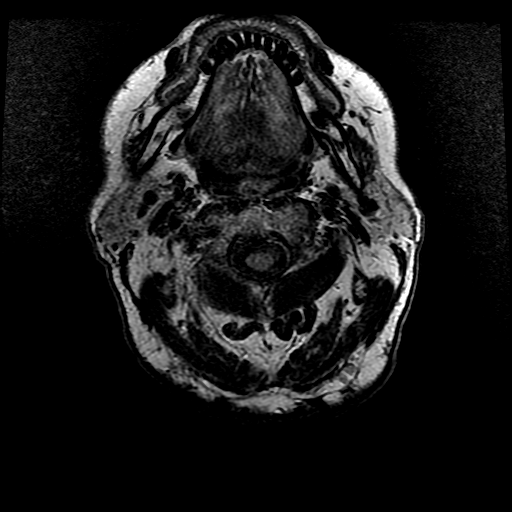
[im 14/28]
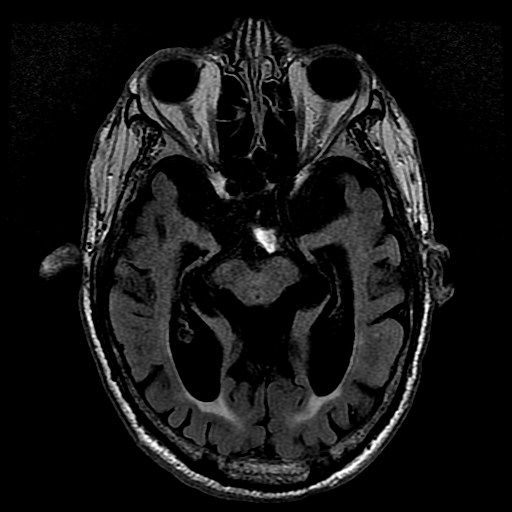
[im 28/28]
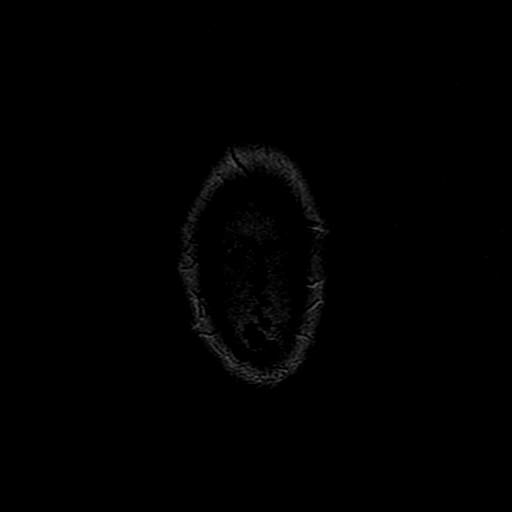

[Series 11: T2 · coronal · 5.0mm · 0.45mm/px · 3 of 28 slices shown (2 of 2)]
[im 1/28]
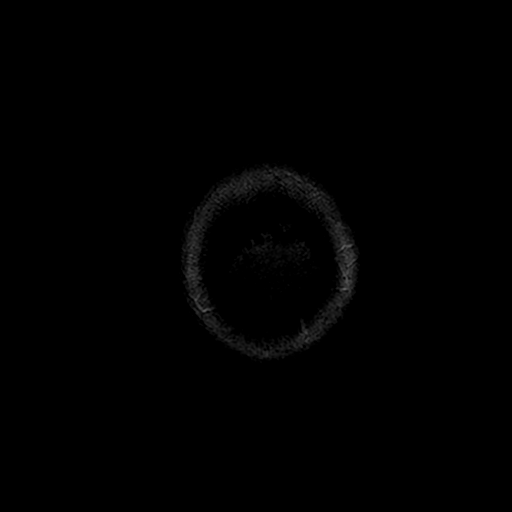
[im 14/28]
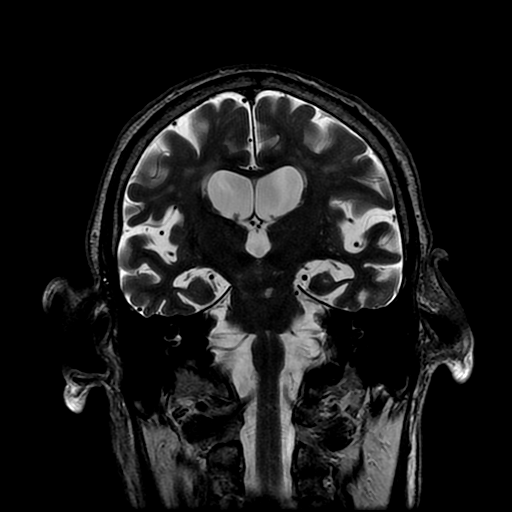
[im 28/28]
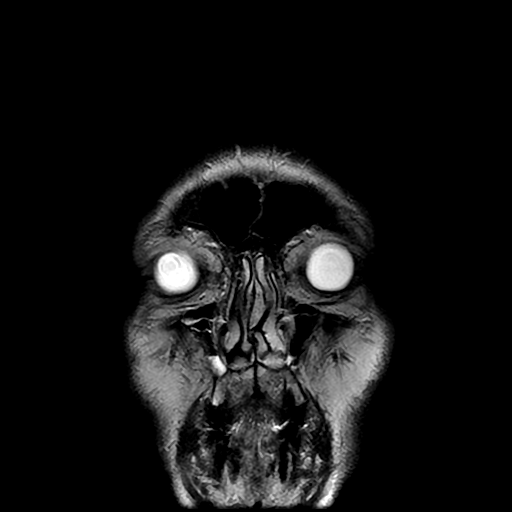

[Series 500: DWI · axial · 3.0mm · 1.09mm/px · z∈[-109,+47]mm · 6 of 54 slices shown (3 of 4)]
[im 1/54]
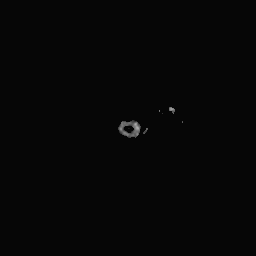
[im 11/54]
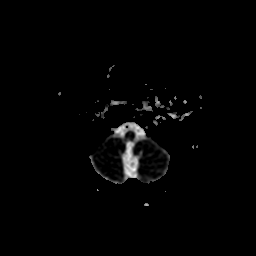
[im 22/54]
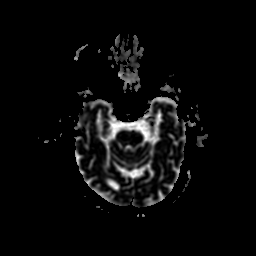
[im 32/54]
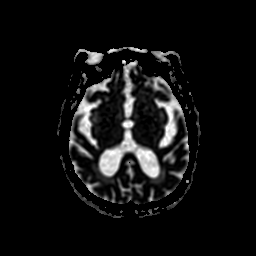
[im 43/54]
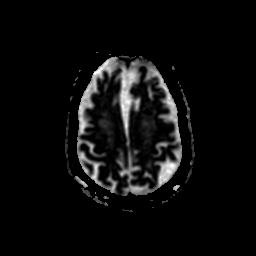
[im 54/54]
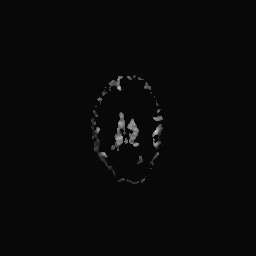

[Series 600: DWI · coronal · 5.0mm · 1.09mm/px · 4 of 36 slices shown (4 of 4)]
[im 1/36]
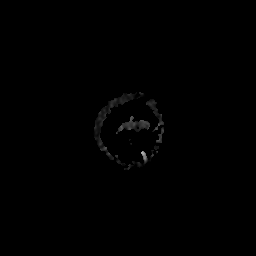
[im 12/36]
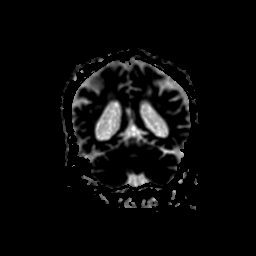
[im 24/36]
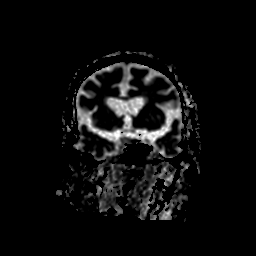
[im 36/36]
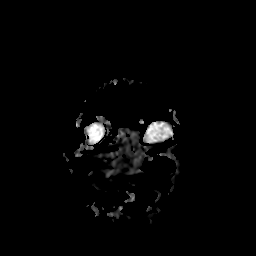

[37 of 48 positions shown; findings below may reference images not displayed]

FINDINGS: BRAIN: Moderate to severe ventriculomegaly on the basis of global
parenchymal brain volume loss as there is overall commensurate
enlargement of cerebral sulci and cerebellar folia. Patchy to
confluent supratentorial and pontine white matter FLAIR T2
hyperintensities. Old bilateral basal ganglia lacunar infarcts.

VASCULAR: Normal major intracranial vascular flow voids present at
skull base, dolichoectasia associated with chronic hypertension.

SKULL AND UPPER CERVICAL SPINE: No abnormal sellar expansion. No
suspicious calvarial bone marrow signal. Craniocervical junction
maintained.

SINUSES/ORBITS: Mild maxillary sinus mucosal thickening without
paranasal sinus air-fluid levels. Mastoid air cells are well
aerated. The included ocular globes and orbital contents are
non-suspicious. Status post bilateral ocular lens implants.

OTHER: Subcentimeter sister lymph node RIGHT parotid gland.
IMPRESSION: No acute intracranial process.

Stable examination including moderate to severe atrophy, moderate to
severe chronic small vessel ischemic disease and old basal ganglia
lacunar infarcts.

## 2018-10-02 DIAGNOSIS — R1310 Dysphagia, unspecified: Secondary | ICD-10-CM | POA: Diagnosis not present

## 2018-10-02 DIAGNOSIS — F0390 Unspecified dementia without behavioral disturbance: Secondary | ICD-10-CM | POA: Diagnosis not present

## 2018-10-02 DIAGNOSIS — Z9181 History of falling: Secondary | ICD-10-CM | POA: Diagnosis not present

## 2018-10-02 DIAGNOSIS — G20 Parkinson's disease: Secondary | ICD-10-CM | POA: Diagnosis not present

## 2018-10-02 DIAGNOSIS — R634 Abnormal weight loss: Secondary | ICD-10-CM | POA: Diagnosis not present

## 2018-10-02 DIAGNOSIS — G7000 Myasthenia gravis without (acute) exacerbation: Secondary | ICD-10-CM | POA: Diagnosis not present

## 2018-10-03 DIAGNOSIS — I10 Essential (primary) hypertension: Secondary | ICD-10-CM | POA: Diagnosis not present

## 2018-10-03 DIAGNOSIS — I739 Peripheral vascular disease, unspecified: Secondary | ICD-10-CM | POA: Diagnosis not present

## 2018-10-03 DIAGNOSIS — K219 Gastro-esophageal reflux disease without esophagitis: Secondary | ICD-10-CM | POA: Diagnosis not present

## 2018-10-03 DIAGNOSIS — G7000 Myasthenia gravis without (acute) exacerbation: Secondary | ICD-10-CM | POA: Diagnosis not present

## 2018-10-05 DIAGNOSIS — F0390 Unspecified dementia without behavioral disturbance: Secondary | ICD-10-CM | POA: Diagnosis not present

## 2018-10-05 DIAGNOSIS — G7000 Myasthenia gravis without (acute) exacerbation: Secondary | ICD-10-CM | POA: Diagnosis not present

## 2018-10-05 DIAGNOSIS — Z9181 History of falling: Secondary | ICD-10-CM | POA: Diagnosis not present

## 2018-10-05 DIAGNOSIS — R634 Abnormal weight loss: Secondary | ICD-10-CM | POA: Diagnosis not present

## 2018-10-05 DIAGNOSIS — G20 Parkinson's disease: Secondary | ICD-10-CM | POA: Diagnosis not present

## 2018-10-05 DIAGNOSIS — R1310 Dysphagia, unspecified: Secondary | ICD-10-CM | POA: Diagnosis not present

## 2018-10-06 DIAGNOSIS — G20 Parkinson's disease: Secondary | ICD-10-CM | POA: Diagnosis not present

## 2018-10-06 DIAGNOSIS — R1310 Dysphagia, unspecified: Secondary | ICD-10-CM | POA: Diagnosis not present

## 2018-10-06 DIAGNOSIS — G7000 Myasthenia gravis without (acute) exacerbation: Secondary | ICD-10-CM | POA: Diagnosis not present

## 2018-10-06 DIAGNOSIS — R634 Abnormal weight loss: Secondary | ICD-10-CM | POA: Diagnosis not present

## 2018-10-06 DIAGNOSIS — F0390 Unspecified dementia without behavioral disturbance: Secondary | ICD-10-CM | POA: Diagnosis not present

## 2018-10-06 DIAGNOSIS — Z9181 History of falling: Secondary | ICD-10-CM | POA: Diagnosis not present

## 2018-10-09 DIAGNOSIS — R634 Abnormal weight loss: Secondary | ICD-10-CM | POA: Diagnosis not present

## 2018-10-09 DIAGNOSIS — Z9181 History of falling: Secondary | ICD-10-CM | POA: Diagnosis not present

## 2018-10-09 DIAGNOSIS — R1310 Dysphagia, unspecified: Secondary | ICD-10-CM | POA: Diagnosis not present

## 2018-10-09 DIAGNOSIS — F0390 Unspecified dementia without behavioral disturbance: Secondary | ICD-10-CM | POA: Diagnosis not present

## 2018-10-09 DIAGNOSIS — G20 Parkinson's disease: Secondary | ICD-10-CM | POA: Diagnosis not present

## 2018-10-09 DIAGNOSIS — G7000 Myasthenia gravis without (acute) exacerbation: Secondary | ICD-10-CM | POA: Diagnosis not present

## 2018-10-11 DIAGNOSIS — G7000 Myasthenia gravis without (acute) exacerbation: Secondary | ICD-10-CM | POA: Diagnosis not present

## 2018-10-11 DIAGNOSIS — F0390 Unspecified dementia without behavioral disturbance: Secondary | ICD-10-CM | POA: Diagnosis not present

## 2018-10-11 DIAGNOSIS — R634 Abnormal weight loss: Secondary | ICD-10-CM | POA: Diagnosis not present

## 2018-10-11 DIAGNOSIS — Z9181 History of falling: Secondary | ICD-10-CM | POA: Diagnosis not present

## 2018-10-11 DIAGNOSIS — G20 Parkinson's disease: Secondary | ICD-10-CM | POA: Diagnosis not present

## 2018-10-11 DIAGNOSIS — R1310 Dysphagia, unspecified: Secondary | ICD-10-CM | POA: Diagnosis not present

## 2018-10-13 DIAGNOSIS — G20 Parkinson's disease: Secondary | ICD-10-CM | POA: Diagnosis not present

## 2018-10-13 DIAGNOSIS — G7000 Myasthenia gravis without (acute) exacerbation: Secondary | ICD-10-CM | POA: Diagnosis not present

## 2018-10-13 DIAGNOSIS — F0390 Unspecified dementia without behavioral disturbance: Secondary | ICD-10-CM | POA: Diagnosis not present

## 2018-10-13 DIAGNOSIS — R634 Abnormal weight loss: Secondary | ICD-10-CM | POA: Diagnosis not present

## 2018-10-13 DIAGNOSIS — Z9181 History of falling: Secondary | ICD-10-CM | POA: Diagnosis not present

## 2018-10-13 DIAGNOSIS — R1310 Dysphagia, unspecified: Secondary | ICD-10-CM | POA: Diagnosis not present

## 2018-10-16 DIAGNOSIS — G20 Parkinson's disease: Secondary | ICD-10-CM | POA: Diagnosis not present

## 2018-10-16 DIAGNOSIS — Z9181 History of falling: Secondary | ICD-10-CM | POA: Diagnosis not present

## 2018-10-16 DIAGNOSIS — R634 Abnormal weight loss: Secondary | ICD-10-CM | POA: Diagnosis not present

## 2018-10-16 DIAGNOSIS — F0390 Unspecified dementia without behavioral disturbance: Secondary | ICD-10-CM | POA: Diagnosis not present

## 2018-10-16 DIAGNOSIS — R1310 Dysphagia, unspecified: Secondary | ICD-10-CM | POA: Diagnosis not present

## 2018-10-16 DIAGNOSIS — G7000 Myasthenia gravis without (acute) exacerbation: Secondary | ICD-10-CM | POA: Diagnosis not present

## 2018-10-18 DIAGNOSIS — R1310 Dysphagia, unspecified: Secondary | ICD-10-CM | POA: Diagnosis not present

## 2018-10-18 DIAGNOSIS — F0390 Unspecified dementia without behavioral disturbance: Secondary | ICD-10-CM | POA: Diagnosis not present

## 2018-10-18 DIAGNOSIS — G7000 Myasthenia gravis without (acute) exacerbation: Secondary | ICD-10-CM | POA: Diagnosis not present

## 2018-10-18 DIAGNOSIS — Z9181 History of falling: Secondary | ICD-10-CM | POA: Diagnosis not present

## 2018-10-18 DIAGNOSIS — R634 Abnormal weight loss: Secondary | ICD-10-CM | POA: Diagnosis not present

## 2018-10-18 DIAGNOSIS — G20 Parkinson's disease: Secondary | ICD-10-CM | POA: Diagnosis not present

## 2018-10-19 DIAGNOSIS — G7000 Myasthenia gravis without (acute) exacerbation: Secondary | ICD-10-CM | POA: Diagnosis not present

## 2018-10-19 DIAGNOSIS — Z9181 History of falling: Secondary | ICD-10-CM | POA: Diagnosis not present

## 2018-10-19 DIAGNOSIS — R634 Abnormal weight loss: Secondary | ICD-10-CM | POA: Diagnosis not present

## 2018-10-19 DIAGNOSIS — G20 Parkinson's disease: Secondary | ICD-10-CM | POA: Diagnosis not present

## 2018-10-19 DIAGNOSIS — F0390 Unspecified dementia without behavioral disturbance: Secondary | ICD-10-CM | POA: Diagnosis not present

## 2018-10-19 DIAGNOSIS — R1310 Dysphagia, unspecified: Secondary | ICD-10-CM | POA: Diagnosis not present

## 2018-10-20 DIAGNOSIS — R1310 Dysphagia, unspecified: Secondary | ICD-10-CM | POA: Diagnosis not present

## 2018-10-20 DIAGNOSIS — F0390 Unspecified dementia without behavioral disturbance: Secondary | ICD-10-CM | POA: Diagnosis not present

## 2018-10-20 DIAGNOSIS — G20 Parkinson's disease: Secondary | ICD-10-CM | POA: Diagnosis not present

## 2018-10-20 DIAGNOSIS — R634 Abnormal weight loss: Secondary | ICD-10-CM | POA: Diagnosis not present

## 2018-10-20 DIAGNOSIS — Z9181 History of falling: Secondary | ICD-10-CM | POA: Diagnosis not present

## 2018-10-20 DIAGNOSIS — G7000 Myasthenia gravis without (acute) exacerbation: Secondary | ICD-10-CM | POA: Diagnosis not present

## 2018-10-23 DIAGNOSIS — G20 Parkinson's disease: Secondary | ICD-10-CM | POA: Diagnosis not present

## 2018-10-23 DIAGNOSIS — Z9181 History of falling: Secondary | ICD-10-CM | POA: Diagnosis not present

## 2018-10-23 DIAGNOSIS — R1310 Dysphagia, unspecified: Secondary | ICD-10-CM | POA: Diagnosis not present

## 2018-10-23 DIAGNOSIS — R634 Abnormal weight loss: Secondary | ICD-10-CM | POA: Diagnosis not present

## 2018-10-23 DIAGNOSIS — F0390 Unspecified dementia without behavioral disturbance: Secondary | ICD-10-CM | POA: Diagnosis not present

## 2018-10-23 DIAGNOSIS — G7000 Myasthenia gravis without (acute) exacerbation: Secondary | ICD-10-CM | POA: Diagnosis not present

## 2018-10-24 DIAGNOSIS — I10 Essential (primary) hypertension: Secondary | ICD-10-CM | POA: Diagnosis not present

## 2018-10-24 DIAGNOSIS — I739 Peripheral vascular disease, unspecified: Secondary | ICD-10-CM | POA: Diagnosis not present

## 2018-10-24 DIAGNOSIS — K219 Gastro-esophageal reflux disease without esophagitis: Secondary | ICD-10-CM | POA: Diagnosis not present

## 2018-10-24 DIAGNOSIS — G7000 Myasthenia gravis without (acute) exacerbation: Secondary | ICD-10-CM | POA: Diagnosis not present

## 2018-10-25 DIAGNOSIS — Z9181 History of falling: Secondary | ICD-10-CM | POA: Diagnosis not present

## 2018-10-25 DIAGNOSIS — G7000 Myasthenia gravis without (acute) exacerbation: Secondary | ICD-10-CM | POA: Diagnosis not present

## 2018-10-25 DIAGNOSIS — F0390 Unspecified dementia without behavioral disturbance: Secondary | ICD-10-CM | POA: Diagnosis not present

## 2018-10-25 DIAGNOSIS — R634 Abnormal weight loss: Secondary | ICD-10-CM | POA: Diagnosis not present

## 2018-10-25 DIAGNOSIS — G20 Parkinson's disease: Secondary | ICD-10-CM | POA: Diagnosis not present

## 2018-10-25 DIAGNOSIS — R1310 Dysphagia, unspecified: Secondary | ICD-10-CM | POA: Diagnosis not present

## 2018-10-27 DIAGNOSIS — Z9181 History of falling: Secondary | ICD-10-CM | POA: Diagnosis not present

## 2018-10-27 DIAGNOSIS — R1310 Dysphagia, unspecified: Secondary | ICD-10-CM | POA: Diagnosis not present

## 2018-10-27 DIAGNOSIS — G7000 Myasthenia gravis without (acute) exacerbation: Secondary | ICD-10-CM | POA: Diagnosis not present

## 2018-10-27 DIAGNOSIS — R634 Abnormal weight loss: Secondary | ICD-10-CM | POA: Diagnosis not present

## 2018-10-27 DIAGNOSIS — G20 Parkinson's disease: Secondary | ICD-10-CM | POA: Diagnosis not present

## 2018-10-27 DIAGNOSIS — F0390 Unspecified dementia without behavioral disturbance: Secondary | ICD-10-CM | POA: Diagnosis not present

## 2018-10-30 DIAGNOSIS — Z9181 History of falling: Secondary | ICD-10-CM | POA: Diagnosis not present

## 2018-10-30 DIAGNOSIS — R634 Abnormal weight loss: Secondary | ICD-10-CM | POA: Diagnosis not present

## 2018-10-30 DIAGNOSIS — R1310 Dysphagia, unspecified: Secondary | ICD-10-CM | POA: Diagnosis not present

## 2018-10-30 DIAGNOSIS — G7000 Myasthenia gravis without (acute) exacerbation: Secondary | ICD-10-CM | POA: Diagnosis not present

## 2018-10-30 DIAGNOSIS — F0390 Unspecified dementia without behavioral disturbance: Secondary | ICD-10-CM | POA: Diagnosis not present

## 2018-10-30 DIAGNOSIS — G20 Parkinson's disease: Secondary | ICD-10-CM | POA: Diagnosis not present

## 2018-10-31 DIAGNOSIS — I10 Essential (primary) hypertension: Secondary | ICD-10-CM | POA: Diagnosis not present

## 2018-10-31 DIAGNOSIS — I739 Peripheral vascular disease, unspecified: Secondary | ICD-10-CM | POA: Diagnosis not present

## 2018-10-31 DIAGNOSIS — G7000 Myasthenia gravis without (acute) exacerbation: Secondary | ICD-10-CM | POA: Diagnosis not present

## 2018-10-31 DIAGNOSIS — F0390 Unspecified dementia without behavioral disturbance: Secondary | ICD-10-CM | POA: Diagnosis not present

## 2018-11-01 DIAGNOSIS — R1310 Dysphagia, unspecified: Secondary | ICD-10-CM | POA: Diagnosis not present

## 2018-11-01 DIAGNOSIS — Z9181 History of falling: Secondary | ICD-10-CM | POA: Diagnosis not present

## 2018-11-01 DIAGNOSIS — G20 Parkinson's disease: Secondary | ICD-10-CM | POA: Diagnosis not present

## 2018-11-01 DIAGNOSIS — R634 Abnormal weight loss: Secondary | ICD-10-CM | POA: Diagnosis not present

## 2018-11-01 DIAGNOSIS — F0390 Unspecified dementia without behavioral disturbance: Secondary | ICD-10-CM | POA: Diagnosis not present

## 2018-11-01 DIAGNOSIS — G7000 Myasthenia gravis without (acute) exacerbation: Secondary | ICD-10-CM | POA: Diagnosis not present

## 2018-11-02 DIAGNOSIS — Z9181 History of falling: Secondary | ICD-10-CM | POA: Diagnosis not present

## 2018-11-02 DIAGNOSIS — F0390 Unspecified dementia without behavioral disturbance: Secondary | ICD-10-CM | POA: Diagnosis not present

## 2018-11-02 DIAGNOSIS — R634 Abnormal weight loss: Secondary | ICD-10-CM | POA: Diagnosis not present

## 2018-11-02 DIAGNOSIS — G7000 Myasthenia gravis without (acute) exacerbation: Secondary | ICD-10-CM | POA: Diagnosis not present

## 2018-11-02 DIAGNOSIS — G20 Parkinson's disease: Secondary | ICD-10-CM | POA: Diagnosis not present

## 2018-11-02 DIAGNOSIS — R1310 Dysphagia, unspecified: Secondary | ICD-10-CM | POA: Diagnosis not present

## 2018-11-03 DIAGNOSIS — F0390 Unspecified dementia without behavioral disturbance: Secondary | ICD-10-CM | POA: Diagnosis not present

## 2018-11-03 DIAGNOSIS — Z9181 History of falling: Secondary | ICD-10-CM | POA: Diagnosis not present

## 2018-11-03 DIAGNOSIS — R634 Abnormal weight loss: Secondary | ICD-10-CM | POA: Diagnosis not present

## 2018-11-03 DIAGNOSIS — G20 Parkinson's disease: Secondary | ICD-10-CM | POA: Diagnosis not present

## 2018-11-03 DIAGNOSIS — G7000 Myasthenia gravis without (acute) exacerbation: Secondary | ICD-10-CM | POA: Diagnosis not present

## 2018-11-03 DIAGNOSIS — R1310 Dysphagia, unspecified: Secondary | ICD-10-CM | POA: Diagnosis not present

## 2018-11-06 DIAGNOSIS — R1310 Dysphagia, unspecified: Secondary | ICD-10-CM | POA: Diagnosis not present

## 2018-11-06 DIAGNOSIS — F0390 Unspecified dementia without behavioral disturbance: Secondary | ICD-10-CM | POA: Diagnosis not present

## 2018-11-06 DIAGNOSIS — G20 Parkinson's disease: Secondary | ICD-10-CM | POA: Diagnosis not present

## 2018-11-06 DIAGNOSIS — Z9181 History of falling: Secondary | ICD-10-CM | POA: Diagnosis not present

## 2018-11-06 DIAGNOSIS — G7000 Myasthenia gravis without (acute) exacerbation: Secondary | ICD-10-CM | POA: Diagnosis not present

## 2018-11-06 DIAGNOSIS — R634 Abnormal weight loss: Secondary | ICD-10-CM | POA: Diagnosis not present

## 2018-11-08 DIAGNOSIS — G20 Parkinson's disease: Secondary | ICD-10-CM | POA: Diagnosis not present

## 2018-11-08 DIAGNOSIS — G7000 Myasthenia gravis without (acute) exacerbation: Secondary | ICD-10-CM | POA: Diagnosis not present

## 2018-11-08 DIAGNOSIS — R634 Abnormal weight loss: Secondary | ICD-10-CM | POA: Diagnosis not present

## 2018-11-08 DIAGNOSIS — Z9181 History of falling: Secondary | ICD-10-CM | POA: Diagnosis not present

## 2018-11-08 DIAGNOSIS — F0390 Unspecified dementia without behavioral disturbance: Secondary | ICD-10-CM | POA: Diagnosis not present

## 2018-11-08 DIAGNOSIS — R1310 Dysphagia, unspecified: Secondary | ICD-10-CM | POA: Diagnosis not present

## 2018-11-09 DIAGNOSIS — F0390 Unspecified dementia without behavioral disturbance: Secondary | ICD-10-CM | POA: Diagnosis not present

## 2018-11-09 DIAGNOSIS — G20 Parkinson's disease: Secondary | ICD-10-CM | POA: Diagnosis not present

## 2018-11-09 DIAGNOSIS — R634 Abnormal weight loss: Secondary | ICD-10-CM | POA: Diagnosis not present

## 2018-11-09 DIAGNOSIS — Z9181 History of falling: Secondary | ICD-10-CM | POA: Diagnosis not present

## 2018-11-09 DIAGNOSIS — R1310 Dysphagia, unspecified: Secondary | ICD-10-CM | POA: Diagnosis not present

## 2018-11-09 DIAGNOSIS — G7000 Myasthenia gravis without (acute) exacerbation: Secondary | ICD-10-CM | POA: Diagnosis not present

## 2018-11-10 DIAGNOSIS — F0390 Unspecified dementia without behavioral disturbance: Secondary | ICD-10-CM | POA: Diagnosis not present

## 2018-11-10 DIAGNOSIS — G20 Parkinson's disease: Secondary | ICD-10-CM | POA: Diagnosis not present

## 2018-11-10 DIAGNOSIS — Z9181 History of falling: Secondary | ICD-10-CM | POA: Diagnosis not present

## 2018-11-10 DIAGNOSIS — R634 Abnormal weight loss: Secondary | ICD-10-CM | POA: Diagnosis not present

## 2018-11-10 DIAGNOSIS — R1310 Dysphagia, unspecified: Secondary | ICD-10-CM | POA: Diagnosis not present

## 2018-11-10 DIAGNOSIS — G7000 Myasthenia gravis without (acute) exacerbation: Secondary | ICD-10-CM | POA: Diagnosis not present

## 2018-11-11 DIAGNOSIS — I10 Essential (primary) hypertension: Secondary | ICD-10-CM | POA: Diagnosis not present

## 2018-11-11 DIAGNOSIS — F0390 Unspecified dementia without behavioral disturbance: Secondary | ICD-10-CM | POA: Diagnosis not present

## 2018-11-11 DIAGNOSIS — G20 Parkinson's disease: Secondary | ICD-10-CM | POA: Diagnosis not present

## 2018-11-11 DIAGNOSIS — G7000 Myasthenia gravis without (acute) exacerbation: Secondary | ICD-10-CM | POA: Diagnosis not present

## 2018-11-11 DIAGNOSIS — R1310 Dysphagia, unspecified: Secondary | ICD-10-CM | POA: Diagnosis not present

## 2018-11-11 DIAGNOSIS — Z9181 History of falling: Secondary | ICD-10-CM | POA: Diagnosis not present

## 2018-11-11 DIAGNOSIS — R634 Abnormal weight loss: Secondary | ICD-10-CM | POA: Diagnosis not present

## 2018-11-13 DIAGNOSIS — R1310 Dysphagia, unspecified: Secondary | ICD-10-CM | POA: Diagnosis not present

## 2018-11-13 DIAGNOSIS — G7000 Myasthenia gravis without (acute) exacerbation: Secondary | ICD-10-CM | POA: Diagnosis not present

## 2018-11-13 DIAGNOSIS — Z9181 History of falling: Secondary | ICD-10-CM | POA: Diagnosis not present

## 2018-11-13 DIAGNOSIS — R634 Abnormal weight loss: Secondary | ICD-10-CM | POA: Diagnosis not present

## 2018-11-13 DIAGNOSIS — G20 Parkinson's disease: Secondary | ICD-10-CM | POA: Diagnosis not present

## 2018-11-13 DIAGNOSIS — F0390 Unspecified dementia without behavioral disturbance: Secondary | ICD-10-CM | POA: Diagnosis not present

## 2018-11-16 DIAGNOSIS — R1310 Dysphagia, unspecified: Secondary | ICD-10-CM | POA: Diagnosis not present

## 2018-11-16 DIAGNOSIS — F0390 Unspecified dementia without behavioral disturbance: Secondary | ICD-10-CM | POA: Diagnosis not present

## 2018-11-16 DIAGNOSIS — R634 Abnormal weight loss: Secondary | ICD-10-CM | POA: Diagnosis not present

## 2018-11-16 DIAGNOSIS — Z9181 History of falling: Secondary | ICD-10-CM | POA: Diagnosis not present

## 2018-11-16 DIAGNOSIS — G7000 Myasthenia gravis without (acute) exacerbation: Secondary | ICD-10-CM | POA: Diagnosis not present

## 2018-11-16 DIAGNOSIS — G20 Parkinson's disease: Secondary | ICD-10-CM | POA: Diagnosis not present

## 2018-11-17 DIAGNOSIS — R634 Abnormal weight loss: Secondary | ICD-10-CM | POA: Diagnosis not present

## 2018-11-17 DIAGNOSIS — G20 Parkinson's disease: Secondary | ICD-10-CM | POA: Diagnosis not present

## 2018-11-17 DIAGNOSIS — F0390 Unspecified dementia without behavioral disturbance: Secondary | ICD-10-CM | POA: Diagnosis not present

## 2018-11-17 DIAGNOSIS — R1310 Dysphagia, unspecified: Secondary | ICD-10-CM | POA: Diagnosis not present

## 2018-11-17 DIAGNOSIS — G7000 Myasthenia gravis without (acute) exacerbation: Secondary | ICD-10-CM | POA: Diagnosis not present

## 2018-11-17 DIAGNOSIS — Z9181 History of falling: Secondary | ICD-10-CM | POA: Diagnosis not present

## 2018-11-20 DIAGNOSIS — Z9181 History of falling: Secondary | ICD-10-CM | POA: Diagnosis not present

## 2018-11-20 DIAGNOSIS — F0390 Unspecified dementia without behavioral disturbance: Secondary | ICD-10-CM | POA: Diagnosis not present

## 2018-11-20 DIAGNOSIS — G20 Parkinson's disease: Secondary | ICD-10-CM | POA: Diagnosis not present

## 2018-11-20 DIAGNOSIS — M79675 Pain in left toe(s): Secondary | ICD-10-CM | POA: Diagnosis not present

## 2018-11-20 DIAGNOSIS — B351 Tinea unguium: Secondary | ICD-10-CM | POA: Diagnosis not present

## 2018-11-20 DIAGNOSIS — R634 Abnormal weight loss: Secondary | ICD-10-CM | POA: Diagnosis not present

## 2018-11-20 DIAGNOSIS — R1310 Dysphagia, unspecified: Secondary | ICD-10-CM | POA: Diagnosis not present

## 2018-11-20 DIAGNOSIS — I739 Peripheral vascular disease, unspecified: Secondary | ICD-10-CM | POA: Diagnosis not present

## 2018-11-20 DIAGNOSIS — G7000 Myasthenia gravis without (acute) exacerbation: Secondary | ICD-10-CM | POA: Diagnosis not present

## 2018-11-21 DIAGNOSIS — F0390 Unspecified dementia without behavioral disturbance: Secondary | ICD-10-CM | POA: Diagnosis not present

## 2018-11-21 DIAGNOSIS — I739 Peripheral vascular disease, unspecified: Secondary | ICD-10-CM | POA: Diagnosis not present

## 2018-11-21 DIAGNOSIS — G7000 Myasthenia gravis without (acute) exacerbation: Secondary | ICD-10-CM | POA: Diagnosis not present

## 2018-11-21 DIAGNOSIS — I10 Essential (primary) hypertension: Secondary | ICD-10-CM | POA: Diagnosis not present

## 2018-11-22 DIAGNOSIS — Z9181 History of falling: Secondary | ICD-10-CM | POA: Diagnosis not present

## 2018-11-22 DIAGNOSIS — G7000 Myasthenia gravis without (acute) exacerbation: Secondary | ICD-10-CM | POA: Diagnosis not present

## 2018-11-22 DIAGNOSIS — G20 Parkinson's disease: Secondary | ICD-10-CM | POA: Diagnosis not present

## 2018-11-22 DIAGNOSIS — R634 Abnormal weight loss: Secondary | ICD-10-CM | POA: Diagnosis not present

## 2018-11-22 DIAGNOSIS — F0390 Unspecified dementia without behavioral disturbance: Secondary | ICD-10-CM | POA: Diagnosis not present

## 2018-11-22 DIAGNOSIS — R1310 Dysphagia, unspecified: Secondary | ICD-10-CM | POA: Diagnosis not present

## 2018-11-23 DIAGNOSIS — G20 Parkinson's disease: Secondary | ICD-10-CM | POA: Diagnosis not present

## 2018-11-23 DIAGNOSIS — F0390 Unspecified dementia without behavioral disturbance: Secondary | ICD-10-CM | POA: Diagnosis not present

## 2018-11-23 DIAGNOSIS — R634 Abnormal weight loss: Secondary | ICD-10-CM | POA: Diagnosis not present

## 2018-11-23 DIAGNOSIS — G7000 Myasthenia gravis without (acute) exacerbation: Secondary | ICD-10-CM | POA: Diagnosis not present

## 2018-11-23 DIAGNOSIS — Z9181 History of falling: Secondary | ICD-10-CM | POA: Diagnosis not present

## 2018-11-23 DIAGNOSIS — R1310 Dysphagia, unspecified: Secondary | ICD-10-CM | POA: Diagnosis not present

## 2018-11-24 DIAGNOSIS — Z9181 History of falling: Secondary | ICD-10-CM | POA: Diagnosis not present

## 2018-11-24 DIAGNOSIS — G20 Parkinson's disease: Secondary | ICD-10-CM | POA: Diagnosis not present

## 2018-11-24 DIAGNOSIS — G7000 Myasthenia gravis without (acute) exacerbation: Secondary | ICD-10-CM | POA: Diagnosis not present

## 2018-11-24 DIAGNOSIS — R1310 Dysphagia, unspecified: Secondary | ICD-10-CM | POA: Diagnosis not present

## 2018-11-24 DIAGNOSIS — F0390 Unspecified dementia without behavioral disturbance: Secondary | ICD-10-CM | POA: Diagnosis not present

## 2018-11-24 DIAGNOSIS — R634 Abnormal weight loss: Secondary | ICD-10-CM | POA: Diagnosis not present

## 2018-11-27 DIAGNOSIS — G7000 Myasthenia gravis without (acute) exacerbation: Secondary | ICD-10-CM | POA: Diagnosis not present

## 2018-11-27 DIAGNOSIS — Z9181 History of falling: Secondary | ICD-10-CM | POA: Diagnosis not present

## 2018-11-27 DIAGNOSIS — G20 Parkinson's disease: Secondary | ICD-10-CM | POA: Diagnosis not present

## 2018-11-27 DIAGNOSIS — R634 Abnormal weight loss: Secondary | ICD-10-CM | POA: Diagnosis not present

## 2018-11-27 DIAGNOSIS — F0390 Unspecified dementia without behavioral disturbance: Secondary | ICD-10-CM | POA: Diagnosis not present

## 2018-11-27 DIAGNOSIS — R1310 Dysphagia, unspecified: Secondary | ICD-10-CM | POA: Diagnosis not present

## 2018-11-28 DIAGNOSIS — I10 Essential (primary) hypertension: Secondary | ICD-10-CM | POA: Diagnosis not present

## 2018-11-28 DIAGNOSIS — G7000 Myasthenia gravis without (acute) exacerbation: Secondary | ICD-10-CM | POA: Diagnosis not present

## 2018-11-28 DIAGNOSIS — I739 Peripheral vascular disease, unspecified: Secondary | ICD-10-CM | POA: Diagnosis not present

## 2018-11-28 DIAGNOSIS — F0390 Unspecified dementia without behavioral disturbance: Secondary | ICD-10-CM | POA: Diagnosis not present

## 2018-11-29 DIAGNOSIS — F0390 Unspecified dementia without behavioral disturbance: Secondary | ICD-10-CM | POA: Diagnosis not present

## 2018-11-29 DIAGNOSIS — Z9181 History of falling: Secondary | ICD-10-CM | POA: Diagnosis not present

## 2018-11-29 DIAGNOSIS — R1310 Dysphagia, unspecified: Secondary | ICD-10-CM | POA: Diagnosis not present

## 2018-11-29 DIAGNOSIS — G7000 Myasthenia gravis without (acute) exacerbation: Secondary | ICD-10-CM | POA: Diagnosis not present

## 2018-11-29 DIAGNOSIS — G20 Parkinson's disease: Secondary | ICD-10-CM | POA: Diagnosis not present

## 2018-11-29 DIAGNOSIS — R634 Abnormal weight loss: Secondary | ICD-10-CM | POA: Diagnosis not present

## 2018-11-30 DIAGNOSIS — G7000 Myasthenia gravis without (acute) exacerbation: Secondary | ICD-10-CM | POA: Diagnosis not present

## 2018-11-30 DIAGNOSIS — F0390 Unspecified dementia without behavioral disturbance: Secondary | ICD-10-CM | POA: Diagnosis not present

## 2018-11-30 DIAGNOSIS — Z9181 History of falling: Secondary | ICD-10-CM | POA: Diagnosis not present

## 2018-11-30 DIAGNOSIS — G20 Parkinson's disease: Secondary | ICD-10-CM | POA: Diagnosis not present

## 2018-11-30 DIAGNOSIS — R634 Abnormal weight loss: Secondary | ICD-10-CM | POA: Diagnosis not present

## 2018-11-30 DIAGNOSIS — R1310 Dysphagia, unspecified: Secondary | ICD-10-CM | POA: Diagnosis not present

## 2018-12-01 DIAGNOSIS — R1310 Dysphagia, unspecified: Secondary | ICD-10-CM | POA: Diagnosis not present

## 2018-12-01 DIAGNOSIS — F0390 Unspecified dementia without behavioral disturbance: Secondary | ICD-10-CM | POA: Diagnosis not present

## 2018-12-01 DIAGNOSIS — G7000 Myasthenia gravis without (acute) exacerbation: Secondary | ICD-10-CM | POA: Diagnosis not present

## 2018-12-01 DIAGNOSIS — Z9181 History of falling: Secondary | ICD-10-CM | POA: Diagnosis not present

## 2018-12-01 DIAGNOSIS — G20 Parkinson's disease: Secondary | ICD-10-CM | POA: Diagnosis not present

## 2018-12-01 DIAGNOSIS — R634 Abnormal weight loss: Secondary | ICD-10-CM | POA: Diagnosis not present

## 2018-12-04 DIAGNOSIS — R1310 Dysphagia, unspecified: Secondary | ICD-10-CM | POA: Diagnosis not present

## 2018-12-04 DIAGNOSIS — F0390 Unspecified dementia without behavioral disturbance: Secondary | ICD-10-CM | POA: Diagnosis not present

## 2018-12-04 DIAGNOSIS — R634 Abnormal weight loss: Secondary | ICD-10-CM | POA: Diagnosis not present

## 2018-12-04 DIAGNOSIS — Z9181 History of falling: Secondary | ICD-10-CM | POA: Diagnosis not present

## 2018-12-04 DIAGNOSIS — G7000 Myasthenia gravis without (acute) exacerbation: Secondary | ICD-10-CM | POA: Diagnosis not present

## 2018-12-04 DIAGNOSIS — G20 Parkinson's disease: Secondary | ICD-10-CM | POA: Diagnosis not present

## 2018-12-06 DIAGNOSIS — R634 Abnormal weight loss: Secondary | ICD-10-CM | POA: Diagnosis not present

## 2018-12-06 DIAGNOSIS — Z9181 History of falling: Secondary | ICD-10-CM | POA: Diagnosis not present

## 2018-12-06 DIAGNOSIS — R1310 Dysphagia, unspecified: Secondary | ICD-10-CM | POA: Diagnosis not present

## 2018-12-06 DIAGNOSIS — F0390 Unspecified dementia without behavioral disturbance: Secondary | ICD-10-CM | POA: Diagnosis not present

## 2018-12-06 DIAGNOSIS — G20 Parkinson's disease: Secondary | ICD-10-CM | POA: Diagnosis not present

## 2018-12-06 DIAGNOSIS — G7000 Myasthenia gravis without (acute) exacerbation: Secondary | ICD-10-CM | POA: Diagnosis not present

## 2018-12-07 DIAGNOSIS — G20 Parkinson's disease: Secondary | ICD-10-CM | POA: Diagnosis not present

## 2018-12-07 DIAGNOSIS — Z9181 History of falling: Secondary | ICD-10-CM | POA: Diagnosis not present

## 2018-12-07 DIAGNOSIS — R634 Abnormal weight loss: Secondary | ICD-10-CM | POA: Diagnosis not present

## 2018-12-07 DIAGNOSIS — R1310 Dysphagia, unspecified: Secondary | ICD-10-CM | POA: Diagnosis not present

## 2018-12-07 DIAGNOSIS — G7000 Myasthenia gravis without (acute) exacerbation: Secondary | ICD-10-CM | POA: Diagnosis not present

## 2018-12-07 DIAGNOSIS — F0390 Unspecified dementia without behavioral disturbance: Secondary | ICD-10-CM | POA: Diagnosis not present

## 2018-12-08 DIAGNOSIS — G7000 Myasthenia gravis without (acute) exacerbation: Secondary | ICD-10-CM | POA: Diagnosis not present

## 2018-12-08 DIAGNOSIS — R634 Abnormal weight loss: Secondary | ICD-10-CM | POA: Diagnosis not present

## 2018-12-08 DIAGNOSIS — Z9181 History of falling: Secondary | ICD-10-CM | POA: Diagnosis not present

## 2018-12-08 DIAGNOSIS — F0390 Unspecified dementia without behavioral disturbance: Secondary | ICD-10-CM | POA: Diagnosis not present

## 2018-12-08 DIAGNOSIS — R1310 Dysphagia, unspecified: Secondary | ICD-10-CM | POA: Diagnosis not present

## 2018-12-08 DIAGNOSIS — G20 Parkinson's disease: Secondary | ICD-10-CM | POA: Diagnosis not present

## 2018-12-10 DIAGNOSIS — Z9181 History of falling: Secondary | ICD-10-CM | POA: Diagnosis not present

## 2018-12-10 DIAGNOSIS — G7000 Myasthenia gravis without (acute) exacerbation: Secondary | ICD-10-CM | POA: Diagnosis not present

## 2018-12-10 DIAGNOSIS — R634 Abnormal weight loss: Secondary | ICD-10-CM | POA: Diagnosis not present

## 2018-12-10 DIAGNOSIS — F0390 Unspecified dementia without behavioral disturbance: Secondary | ICD-10-CM | POA: Diagnosis not present

## 2018-12-10 DIAGNOSIS — G20 Parkinson's disease: Secondary | ICD-10-CM | POA: Diagnosis not present

## 2018-12-10 DIAGNOSIS — R1310 Dysphagia, unspecified: Secondary | ICD-10-CM | POA: Diagnosis not present

## 2018-12-10 DIAGNOSIS — I10 Essential (primary) hypertension: Secondary | ICD-10-CM | POA: Diagnosis not present

## 2018-12-11 DIAGNOSIS — R634 Abnormal weight loss: Secondary | ICD-10-CM | POA: Diagnosis not present

## 2018-12-11 DIAGNOSIS — R1310 Dysphagia, unspecified: Secondary | ICD-10-CM | POA: Diagnosis not present

## 2018-12-11 DIAGNOSIS — Z9181 History of falling: Secondary | ICD-10-CM | POA: Diagnosis not present

## 2018-12-11 DIAGNOSIS — G20 Parkinson's disease: Secondary | ICD-10-CM | POA: Diagnosis not present

## 2018-12-11 DIAGNOSIS — G7000 Myasthenia gravis without (acute) exacerbation: Secondary | ICD-10-CM | POA: Diagnosis not present

## 2018-12-11 DIAGNOSIS — F0390 Unspecified dementia without behavioral disturbance: Secondary | ICD-10-CM | POA: Diagnosis not present

## 2018-12-12 DIAGNOSIS — Z9181 History of falling: Secondary | ICD-10-CM | POA: Diagnosis not present

## 2018-12-12 DIAGNOSIS — F0390 Unspecified dementia without behavioral disturbance: Secondary | ICD-10-CM | POA: Diagnosis not present

## 2018-12-12 DIAGNOSIS — R634 Abnormal weight loss: Secondary | ICD-10-CM | POA: Diagnosis not present

## 2018-12-12 DIAGNOSIS — R1310 Dysphagia, unspecified: Secondary | ICD-10-CM | POA: Diagnosis not present

## 2018-12-12 DIAGNOSIS — G7000 Myasthenia gravis without (acute) exacerbation: Secondary | ICD-10-CM | POA: Diagnosis not present

## 2018-12-12 DIAGNOSIS — G20 Parkinson's disease: Secondary | ICD-10-CM | POA: Diagnosis not present

## 2018-12-13 DIAGNOSIS — R634 Abnormal weight loss: Secondary | ICD-10-CM | POA: Diagnosis not present

## 2018-12-13 DIAGNOSIS — R1310 Dysphagia, unspecified: Secondary | ICD-10-CM | POA: Diagnosis not present

## 2018-12-13 DIAGNOSIS — G7000 Myasthenia gravis without (acute) exacerbation: Secondary | ICD-10-CM | POA: Diagnosis not present

## 2018-12-13 DIAGNOSIS — G20 Parkinson's disease: Secondary | ICD-10-CM | POA: Diagnosis not present

## 2018-12-13 DIAGNOSIS — Z9181 History of falling: Secondary | ICD-10-CM | POA: Diagnosis not present

## 2018-12-13 DIAGNOSIS — F0390 Unspecified dementia without behavioral disturbance: Secondary | ICD-10-CM | POA: Diagnosis not present

## 2018-12-15 DIAGNOSIS — F0390 Unspecified dementia without behavioral disturbance: Secondary | ICD-10-CM | POA: Diagnosis not present

## 2018-12-15 DIAGNOSIS — Z9181 History of falling: Secondary | ICD-10-CM | POA: Diagnosis not present

## 2018-12-15 DIAGNOSIS — G20 Parkinson's disease: Secondary | ICD-10-CM | POA: Diagnosis not present

## 2018-12-15 DIAGNOSIS — R634 Abnormal weight loss: Secondary | ICD-10-CM | POA: Diagnosis not present

## 2018-12-15 DIAGNOSIS — R1310 Dysphagia, unspecified: Secondary | ICD-10-CM | POA: Diagnosis not present

## 2018-12-15 DIAGNOSIS — G7000 Myasthenia gravis without (acute) exacerbation: Secondary | ICD-10-CM | POA: Diagnosis not present

## 2018-12-18 DIAGNOSIS — R1310 Dysphagia, unspecified: Secondary | ICD-10-CM | POA: Diagnosis not present

## 2018-12-18 DIAGNOSIS — R634 Abnormal weight loss: Secondary | ICD-10-CM | POA: Diagnosis not present

## 2018-12-18 DIAGNOSIS — G20 Parkinson's disease: Secondary | ICD-10-CM | POA: Diagnosis not present

## 2018-12-18 DIAGNOSIS — G7000 Myasthenia gravis without (acute) exacerbation: Secondary | ICD-10-CM | POA: Diagnosis not present

## 2018-12-18 DIAGNOSIS — F0390 Unspecified dementia without behavioral disturbance: Secondary | ICD-10-CM | POA: Diagnosis not present

## 2018-12-18 DIAGNOSIS — Z9181 History of falling: Secondary | ICD-10-CM | POA: Diagnosis not present

## 2018-12-19 DIAGNOSIS — F0390 Unspecified dementia without behavioral disturbance: Secondary | ICD-10-CM | POA: Diagnosis not present

## 2018-12-19 DIAGNOSIS — I10 Essential (primary) hypertension: Secondary | ICD-10-CM | POA: Diagnosis not present

## 2018-12-19 DIAGNOSIS — G7000 Myasthenia gravis without (acute) exacerbation: Secondary | ICD-10-CM | POA: Diagnosis not present

## 2018-12-19 DIAGNOSIS — I739 Peripheral vascular disease, unspecified: Secondary | ICD-10-CM | POA: Diagnosis not present

## 2018-12-20 DIAGNOSIS — R634 Abnormal weight loss: Secondary | ICD-10-CM | POA: Diagnosis not present

## 2018-12-20 DIAGNOSIS — G20 Parkinson's disease: Secondary | ICD-10-CM | POA: Diagnosis not present

## 2018-12-20 DIAGNOSIS — Z9181 History of falling: Secondary | ICD-10-CM | POA: Diagnosis not present

## 2018-12-20 DIAGNOSIS — F0390 Unspecified dementia without behavioral disturbance: Secondary | ICD-10-CM | POA: Diagnosis not present

## 2018-12-20 DIAGNOSIS — G7000 Myasthenia gravis without (acute) exacerbation: Secondary | ICD-10-CM | POA: Diagnosis not present

## 2018-12-20 DIAGNOSIS — R1310 Dysphagia, unspecified: Secondary | ICD-10-CM | POA: Diagnosis not present

## 2018-12-21 DIAGNOSIS — R634 Abnormal weight loss: Secondary | ICD-10-CM | POA: Diagnosis not present

## 2018-12-21 DIAGNOSIS — G20 Parkinson's disease: Secondary | ICD-10-CM | POA: Diagnosis not present

## 2018-12-21 DIAGNOSIS — Z9181 History of falling: Secondary | ICD-10-CM | POA: Diagnosis not present

## 2018-12-21 DIAGNOSIS — G7000 Myasthenia gravis without (acute) exacerbation: Secondary | ICD-10-CM | POA: Diagnosis not present

## 2018-12-21 DIAGNOSIS — R1310 Dysphagia, unspecified: Secondary | ICD-10-CM | POA: Diagnosis not present

## 2018-12-21 DIAGNOSIS — F0390 Unspecified dementia without behavioral disturbance: Secondary | ICD-10-CM | POA: Diagnosis not present

## 2018-12-22 DIAGNOSIS — G7000 Myasthenia gravis without (acute) exacerbation: Secondary | ICD-10-CM | POA: Diagnosis not present

## 2018-12-22 DIAGNOSIS — G20 Parkinson's disease: Secondary | ICD-10-CM | POA: Diagnosis not present

## 2018-12-22 DIAGNOSIS — F0390 Unspecified dementia without behavioral disturbance: Secondary | ICD-10-CM | POA: Diagnosis not present

## 2018-12-22 DIAGNOSIS — R1310 Dysphagia, unspecified: Secondary | ICD-10-CM | POA: Diagnosis not present

## 2018-12-22 DIAGNOSIS — Z9181 History of falling: Secondary | ICD-10-CM | POA: Diagnosis not present

## 2018-12-22 DIAGNOSIS — R634 Abnormal weight loss: Secondary | ICD-10-CM | POA: Diagnosis not present

## 2018-12-25 DIAGNOSIS — F0390 Unspecified dementia without behavioral disturbance: Secondary | ICD-10-CM | POA: Diagnosis not present

## 2018-12-25 DIAGNOSIS — Z9181 History of falling: Secondary | ICD-10-CM | POA: Diagnosis not present

## 2018-12-25 DIAGNOSIS — G7000 Myasthenia gravis without (acute) exacerbation: Secondary | ICD-10-CM | POA: Diagnosis not present

## 2018-12-25 DIAGNOSIS — R634 Abnormal weight loss: Secondary | ICD-10-CM | POA: Diagnosis not present

## 2018-12-25 DIAGNOSIS — G20 Parkinson's disease: Secondary | ICD-10-CM | POA: Diagnosis not present

## 2018-12-25 DIAGNOSIS — R1310 Dysphagia, unspecified: Secondary | ICD-10-CM | POA: Diagnosis not present

## 2018-12-26 DIAGNOSIS — F0390 Unspecified dementia without behavioral disturbance: Secondary | ICD-10-CM | POA: Diagnosis not present

## 2018-12-26 DIAGNOSIS — I10 Essential (primary) hypertension: Secondary | ICD-10-CM | POA: Diagnosis not present

## 2018-12-26 DIAGNOSIS — I739 Peripheral vascular disease, unspecified: Secondary | ICD-10-CM | POA: Diagnosis not present

## 2018-12-26 DIAGNOSIS — G7000 Myasthenia gravis without (acute) exacerbation: Secondary | ICD-10-CM | POA: Diagnosis not present

## 2018-12-27 DIAGNOSIS — R1310 Dysphagia, unspecified: Secondary | ICD-10-CM | POA: Diagnosis not present

## 2018-12-27 DIAGNOSIS — Z9181 History of falling: Secondary | ICD-10-CM | POA: Diagnosis not present

## 2018-12-27 DIAGNOSIS — R634 Abnormal weight loss: Secondary | ICD-10-CM | POA: Diagnosis not present

## 2018-12-27 DIAGNOSIS — F0390 Unspecified dementia without behavioral disturbance: Secondary | ICD-10-CM | POA: Diagnosis not present

## 2018-12-27 DIAGNOSIS — G20 Parkinson's disease: Secondary | ICD-10-CM | POA: Diagnosis not present

## 2018-12-27 DIAGNOSIS — G7000 Myasthenia gravis without (acute) exacerbation: Secondary | ICD-10-CM | POA: Diagnosis not present

## 2018-12-28 DIAGNOSIS — G7000 Myasthenia gravis without (acute) exacerbation: Secondary | ICD-10-CM | POA: Diagnosis not present

## 2018-12-28 DIAGNOSIS — F0390 Unspecified dementia without behavioral disturbance: Secondary | ICD-10-CM | POA: Diagnosis not present

## 2018-12-28 DIAGNOSIS — Z9181 History of falling: Secondary | ICD-10-CM | POA: Diagnosis not present

## 2018-12-28 DIAGNOSIS — G20 Parkinson's disease: Secondary | ICD-10-CM | POA: Diagnosis not present

## 2018-12-28 DIAGNOSIS — R1310 Dysphagia, unspecified: Secondary | ICD-10-CM | POA: Diagnosis not present

## 2018-12-28 DIAGNOSIS — R634 Abnormal weight loss: Secondary | ICD-10-CM | POA: Diagnosis not present

## 2018-12-29 DIAGNOSIS — R634 Abnormal weight loss: Secondary | ICD-10-CM | POA: Diagnosis not present

## 2018-12-29 DIAGNOSIS — G7000 Myasthenia gravis without (acute) exacerbation: Secondary | ICD-10-CM | POA: Diagnosis not present

## 2018-12-29 DIAGNOSIS — G20 Parkinson's disease: Secondary | ICD-10-CM | POA: Diagnosis not present

## 2018-12-29 DIAGNOSIS — F0390 Unspecified dementia without behavioral disturbance: Secondary | ICD-10-CM | POA: Diagnosis not present

## 2018-12-29 DIAGNOSIS — R1310 Dysphagia, unspecified: Secondary | ICD-10-CM | POA: Diagnosis not present

## 2018-12-29 DIAGNOSIS — Z9181 History of falling: Secondary | ICD-10-CM | POA: Diagnosis not present

## 2019-01-01 DIAGNOSIS — R1310 Dysphagia, unspecified: Secondary | ICD-10-CM | POA: Diagnosis not present

## 2019-01-01 DIAGNOSIS — R634 Abnormal weight loss: Secondary | ICD-10-CM | POA: Diagnosis not present

## 2019-01-01 DIAGNOSIS — Z9181 History of falling: Secondary | ICD-10-CM | POA: Diagnosis not present

## 2019-01-01 DIAGNOSIS — F0390 Unspecified dementia without behavioral disturbance: Secondary | ICD-10-CM | POA: Diagnosis not present

## 2019-01-01 DIAGNOSIS — G7000 Myasthenia gravis without (acute) exacerbation: Secondary | ICD-10-CM | POA: Diagnosis not present

## 2019-01-01 DIAGNOSIS — G20 Parkinson's disease: Secondary | ICD-10-CM | POA: Diagnosis not present

## 2019-01-05 DIAGNOSIS — G20 Parkinson's disease: Secondary | ICD-10-CM | POA: Diagnosis not present

## 2019-01-05 DIAGNOSIS — R634 Abnormal weight loss: Secondary | ICD-10-CM | POA: Diagnosis not present

## 2019-01-05 DIAGNOSIS — Z9181 History of falling: Secondary | ICD-10-CM | POA: Diagnosis not present

## 2019-01-05 DIAGNOSIS — F0390 Unspecified dementia without behavioral disturbance: Secondary | ICD-10-CM | POA: Diagnosis not present

## 2019-01-05 DIAGNOSIS — R1310 Dysphagia, unspecified: Secondary | ICD-10-CM | POA: Diagnosis not present

## 2019-01-05 DIAGNOSIS — G7000 Myasthenia gravis without (acute) exacerbation: Secondary | ICD-10-CM | POA: Diagnosis not present

## 2019-01-08 DIAGNOSIS — R1310 Dysphagia, unspecified: Secondary | ICD-10-CM | POA: Diagnosis not present

## 2019-01-08 DIAGNOSIS — R634 Abnormal weight loss: Secondary | ICD-10-CM | POA: Diagnosis not present

## 2019-01-08 DIAGNOSIS — G20 Parkinson's disease: Secondary | ICD-10-CM | POA: Diagnosis not present

## 2019-01-08 DIAGNOSIS — G7000 Myasthenia gravis without (acute) exacerbation: Secondary | ICD-10-CM | POA: Diagnosis not present

## 2019-01-08 DIAGNOSIS — Z9181 History of falling: Secondary | ICD-10-CM | POA: Diagnosis not present

## 2019-01-08 DIAGNOSIS — F0390 Unspecified dementia without behavioral disturbance: Secondary | ICD-10-CM | POA: Diagnosis not present

## 2019-01-10 DIAGNOSIS — R1310 Dysphagia, unspecified: Secondary | ICD-10-CM | POA: Diagnosis not present

## 2019-01-10 DIAGNOSIS — Z9181 History of falling: Secondary | ICD-10-CM | POA: Diagnosis not present

## 2019-01-10 DIAGNOSIS — F0390 Unspecified dementia without behavioral disturbance: Secondary | ICD-10-CM | POA: Diagnosis not present

## 2019-01-10 DIAGNOSIS — R634 Abnormal weight loss: Secondary | ICD-10-CM | POA: Diagnosis not present

## 2019-01-10 DIAGNOSIS — I10 Essential (primary) hypertension: Secondary | ICD-10-CM | POA: Diagnosis not present

## 2019-01-10 DIAGNOSIS — G7000 Myasthenia gravis without (acute) exacerbation: Secondary | ICD-10-CM | POA: Diagnosis not present

## 2019-01-10 DIAGNOSIS — G20 Parkinson's disease: Secondary | ICD-10-CM | POA: Diagnosis not present

## 2019-01-12 DIAGNOSIS — R634 Abnormal weight loss: Secondary | ICD-10-CM | POA: Diagnosis not present

## 2019-01-12 DIAGNOSIS — R1310 Dysphagia, unspecified: Secondary | ICD-10-CM | POA: Diagnosis not present

## 2019-01-12 DIAGNOSIS — Z9181 History of falling: Secondary | ICD-10-CM | POA: Diagnosis not present

## 2019-01-12 DIAGNOSIS — G20 Parkinson's disease: Secondary | ICD-10-CM | POA: Diagnosis not present

## 2019-01-12 DIAGNOSIS — F0390 Unspecified dementia without behavioral disturbance: Secondary | ICD-10-CM | POA: Diagnosis not present

## 2019-01-12 DIAGNOSIS — G7000 Myasthenia gravis without (acute) exacerbation: Secondary | ICD-10-CM | POA: Diagnosis not present

## 2019-01-16 DIAGNOSIS — G7000 Myasthenia gravis without (acute) exacerbation: Secondary | ICD-10-CM | POA: Diagnosis not present

## 2019-01-16 DIAGNOSIS — I10 Essential (primary) hypertension: Secondary | ICD-10-CM | POA: Diagnosis not present

## 2019-01-16 DIAGNOSIS — I739 Peripheral vascular disease, unspecified: Secondary | ICD-10-CM | POA: Diagnosis not present

## 2019-01-16 DIAGNOSIS — K219 Gastro-esophageal reflux disease without esophagitis: Secondary | ICD-10-CM | POA: Diagnosis not present

## 2019-01-23 DIAGNOSIS — I10 Essential (primary) hypertension: Secondary | ICD-10-CM | POA: Diagnosis not present

## 2019-01-23 DIAGNOSIS — G7000 Myasthenia gravis without (acute) exacerbation: Secondary | ICD-10-CM | POA: Diagnosis not present

## 2019-01-23 DIAGNOSIS — K219 Gastro-esophageal reflux disease without esophagitis: Secondary | ICD-10-CM | POA: Diagnosis not present

## 2019-01-23 DIAGNOSIS — I739 Peripheral vascular disease, unspecified: Secondary | ICD-10-CM | POA: Diagnosis not present

## 2019-01-26 DIAGNOSIS — R1310 Dysphagia, unspecified: Secondary | ICD-10-CM | POA: Diagnosis not present

## 2019-01-26 DIAGNOSIS — F0390 Unspecified dementia without behavioral disturbance: Secondary | ICD-10-CM | POA: Diagnosis not present

## 2019-01-26 DIAGNOSIS — Z9181 History of falling: Secondary | ICD-10-CM | POA: Diagnosis not present

## 2019-01-26 DIAGNOSIS — G7000 Myasthenia gravis without (acute) exacerbation: Secondary | ICD-10-CM | POA: Diagnosis not present

## 2019-01-26 DIAGNOSIS — G20 Parkinson's disease: Secondary | ICD-10-CM | POA: Diagnosis not present

## 2019-01-26 DIAGNOSIS — R634 Abnormal weight loss: Secondary | ICD-10-CM | POA: Diagnosis not present

## 2019-02-02 DIAGNOSIS — G20 Parkinson's disease: Secondary | ICD-10-CM | POA: Diagnosis not present

## 2019-02-02 DIAGNOSIS — F0390 Unspecified dementia without behavioral disturbance: Secondary | ICD-10-CM | POA: Diagnosis not present

## 2019-02-02 DIAGNOSIS — R634 Abnormal weight loss: Secondary | ICD-10-CM | POA: Diagnosis not present

## 2019-02-02 DIAGNOSIS — Z9181 History of falling: Secondary | ICD-10-CM | POA: Diagnosis not present

## 2019-02-02 DIAGNOSIS — R1310 Dysphagia, unspecified: Secondary | ICD-10-CM | POA: Diagnosis not present

## 2019-02-02 DIAGNOSIS — G7000 Myasthenia gravis without (acute) exacerbation: Secondary | ICD-10-CM | POA: Diagnosis not present

## 2019-02-09 DIAGNOSIS — G7000 Myasthenia gravis without (acute) exacerbation: Secondary | ICD-10-CM | POA: Diagnosis not present

## 2019-02-09 DIAGNOSIS — R634 Abnormal weight loss: Secondary | ICD-10-CM | POA: Diagnosis not present

## 2019-02-09 DIAGNOSIS — G20 Parkinson's disease: Secondary | ICD-10-CM | POA: Diagnosis not present

## 2019-02-09 DIAGNOSIS — R1310 Dysphagia, unspecified: Secondary | ICD-10-CM | POA: Diagnosis not present

## 2019-02-09 DIAGNOSIS — F0390 Unspecified dementia without behavioral disturbance: Secondary | ICD-10-CM | POA: Diagnosis not present

## 2019-02-09 DIAGNOSIS — I10 Essential (primary) hypertension: Secondary | ICD-10-CM | POA: Diagnosis not present

## 2019-02-09 DIAGNOSIS — Z9181 History of falling: Secondary | ICD-10-CM | POA: Diagnosis not present

## 2019-02-13 DIAGNOSIS — K219 Gastro-esophageal reflux disease without esophagitis: Secondary | ICD-10-CM | POA: Diagnosis not present

## 2019-02-13 DIAGNOSIS — G7000 Myasthenia gravis without (acute) exacerbation: Secondary | ICD-10-CM | POA: Diagnosis not present

## 2019-02-13 DIAGNOSIS — I739 Peripheral vascular disease, unspecified: Secondary | ICD-10-CM | POA: Diagnosis not present

## 2019-02-13 DIAGNOSIS — I10 Essential (primary) hypertension: Secondary | ICD-10-CM | POA: Diagnosis not present

## 2019-02-16 DIAGNOSIS — F0390 Unspecified dementia without behavioral disturbance: Secondary | ICD-10-CM | POA: Diagnosis not present

## 2019-02-16 DIAGNOSIS — R634 Abnormal weight loss: Secondary | ICD-10-CM | POA: Diagnosis not present

## 2019-02-16 DIAGNOSIS — Z9181 History of falling: Secondary | ICD-10-CM | POA: Diagnosis not present

## 2019-02-16 DIAGNOSIS — R1310 Dysphagia, unspecified: Secondary | ICD-10-CM | POA: Diagnosis not present

## 2019-02-16 DIAGNOSIS — G7000 Myasthenia gravis without (acute) exacerbation: Secondary | ICD-10-CM | POA: Diagnosis not present

## 2019-02-16 DIAGNOSIS — G20 Parkinson's disease: Secondary | ICD-10-CM | POA: Diagnosis not present

## 2019-02-19 DIAGNOSIS — R634 Abnormal weight loss: Secondary | ICD-10-CM | POA: Diagnosis not present

## 2019-02-19 DIAGNOSIS — G7000 Myasthenia gravis without (acute) exacerbation: Secondary | ICD-10-CM | POA: Diagnosis not present

## 2019-02-19 DIAGNOSIS — R1310 Dysphagia, unspecified: Secondary | ICD-10-CM | POA: Diagnosis not present

## 2019-02-19 DIAGNOSIS — F0390 Unspecified dementia without behavioral disturbance: Secondary | ICD-10-CM | POA: Diagnosis not present

## 2019-02-19 DIAGNOSIS — Z9181 History of falling: Secondary | ICD-10-CM | POA: Diagnosis not present

## 2019-02-19 DIAGNOSIS — G20 Parkinson's disease: Secondary | ICD-10-CM | POA: Diagnosis not present

## 2019-02-20 DIAGNOSIS — I739 Peripheral vascular disease, unspecified: Secondary | ICD-10-CM | POA: Diagnosis not present

## 2019-02-20 DIAGNOSIS — I10 Essential (primary) hypertension: Secondary | ICD-10-CM | POA: Diagnosis not present

## 2019-02-20 DIAGNOSIS — G7000 Myasthenia gravis without (acute) exacerbation: Secondary | ICD-10-CM | POA: Diagnosis not present

## 2019-02-20 DIAGNOSIS — K219 Gastro-esophageal reflux disease without esophagitis: Secondary | ICD-10-CM | POA: Diagnosis not present

## 2019-02-23 DIAGNOSIS — R634 Abnormal weight loss: Secondary | ICD-10-CM | POA: Diagnosis not present

## 2019-02-23 DIAGNOSIS — F0390 Unspecified dementia without behavioral disturbance: Secondary | ICD-10-CM | POA: Diagnosis not present

## 2019-02-23 DIAGNOSIS — G20 Parkinson's disease: Secondary | ICD-10-CM | POA: Diagnosis not present

## 2019-02-23 DIAGNOSIS — Z9181 History of falling: Secondary | ICD-10-CM | POA: Diagnosis not present

## 2019-02-23 DIAGNOSIS — R1310 Dysphagia, unspecified: Secondary | ICD-10-CM | POA: Diagnosis not present

## 2019-02-23 DIAGNOSIS — G7000 Myasthenia gravis without (acute) exacerbation: Secondary | ICD-10-CM | POA: Diagnosis not present

## 2019-03-01 DIAGNOSIS — F0390 Unspecified dementia without behavioral disturbance: Secondary | ICD-10-CM | POA: Diagnosis not present

## 2019-03-01 DIAGNOSIS — R634 Abnormal weight loss: Secondary | ICD-10-CM | POA: Diagnosis not present

## 2019-03-01 DIAGNOSIS — R1310 Dysphagia, unspecified: Secondary | ICD-10-CM | POA: Diagnosis not present

## 2019-03-01 DIAGNOSIS — Z9181 History of falling: Secondary | ICD-10-CM | POA: Diagnosis not present

## 2019-03-01 DIAGNOSIS — G7000 Myasthenia gravis without (acute) exacerbation: Secondary | ICD-10-CM | POA: Diagnosis not present

## 2019-03-01 DIAGNOSIS — G20 Parkinson's disease: Secondary | ICD-10-CM | POA: Diagnosis not present

## 2019-03-12 DIAGNOSIS — Z9181 History of falling: Secondary | ICD-10-CM | POA: Diagnosis not present

## 2019-03-12 DIAGNOSIS — R1310 Dysphagia, unspecified: Secondary | ICD-10-CM | POA: Diagnosis not present

## 2019-03-12 DIAGNOSIS — R64 Cachexia: Secondary | ICD-10-CM | POA: Diagnosis not present

## 2019-03-12 DIAGNOSIS — R634 Abnormal weight loss: Secondary | ICD-10-CM | POA: Diagnosis not present

## 2019-03-12 DIAGNOSIS — I10 Essential (primary) hypertension: Secondary | ICD-10-CM | POA: Diagnosis not present

## 2019-03-12 DIAGNOSIS — G7000 Myasthenia gravis without (acute) exacerbation: Secondary | ICD-10-CM | POA: Diagnosis not present

## 2019-03-12 DIAGNOSIS — G20 Parkinson's disease: Secondary | ICD-10-CM | POA: Diagnosis not present

## 2019-03-12 DIAGNOSIS — F0390 Unspecified dementia without behavioral disturbance: Secondary | ICD-10-CM | POA: Diagnosis not present

## 2019-03-13 DIAGNOSIS — G4700 Insomnia, unspecified: Secondary | ICD-10-CM | POA: Diagnosis not present

## 2019-03-13 DIAGNOSIS — I739 Peripheral vascular disease, unspecified: Secondary | ICD-10-CM | POA: Diagnosis not present

## 2019-03-13 DIAGNOSIS — L0390 Cellulitis, unspecified: Secondary | ICD-10-CM | POA: Diagnosis not present

## 2019-03-13 DIAGNOSIS — I10 Essential (primary) hypertension: Secondary | ICD-10-CM | POA: Diagnosis not present

## 2019-03-16 DIAGNOSIS — R1310 Dysphagia, unspecified: Secondary | ICD-10-CM | POA: Diagnosis not present

## 2019-03-16 DIAGNOSIS — Z9181 History of falling: Secondary | ICD-10-CM | POA: Diagnosis not present

## 2019-03-16 DIAGNOSIS — F0390 Unspecified dementia without behavioral disturbance: Secondary | ICD-10-CM | POA: Diagnosis not present

## 2019-03-16 DIAGNOSIS — G20 Parkinson's disease: Secondary | ICD-10-CM | POA: Diagnosis not present

## 2019-03-16 DIAGNOSIS — G7000 Myasthenia gravis without (acute) exacerbation: Secondary | ICD-10-CM | POA: Diagnosis not present

## 2019-03-16 DIAGNOSIS — R64 Cachexia: Secondary | ICD-10-CM | POA: Diagnosis not present

## 2019-03-19 DIAGNOSIS — R64 Cachexia: Secondary | ICD-10-CM | POA: Diagnosis not present

## 2019-03-19 DIAGNOSIS — G20 Parkinson's disease: Secondary | ICD-10-CM | POA: Diagnosis not present

## 2019-03-19 DIAGNOSIS — Z9181 History of falling: Secondary | ICD-10-CM | POA: Diagnosis not present

## 2019-03-19 DIAGNOSIS — G7000 Myasthenia gravis without (acute) exacerbation: Secondary | ICD-10-CM | POA: Diagnosis not present

## 2019-03-19 DIAGNOSIS — F0390 Unspecified dementia without behavioral disturbance: Secondary | ICD-10-CM | POA: Diagnosis not present

## 2019-03-19 DIAGNOSIS — R1310 Dysphagia, unspecified: Secondary | ICD-10-CM | POA: Diagnosis not present

## 2019-03-20 DIAGNOSIS — G7000 Myasthenia gravis without (acute) exacerbation: Secondary | ICD-10-CM | POA: Diagnosis not present

## 2019-03-20 DIAGNOSIS — G4700 Insomnia, unspecified: Secondary | ICD-10-CM | POA: Diagnosis not present

## 2019-03-20 DIAGNOSIS — I739 Peripheral vascular disease, unspecified: Secondary | ICD-10-CM | POA: Diagnosis not present

## 2019-03-20 DIAGNOSIS — L0390 Cellulitis, unspecified: Secondary | ICD-10-CM | POA: Diagnosis not present

## 2019-03-23 DIAGNOSIS — G7000 Myasthenia gravis without (acute) exacerbation: Secondary | ICD-10-CM | POA: Diagnosis not present

## 2019-03-23 DIAGNOSIS — G20 Parkinson's disease: Secondary | ICD-10-CM | POA: Diagnosis not present

## 2019-03-23 DIAGNOSIS — R1310 Dysphagia, unspecified: Secondary | ICD-10-CM | POA: Diagnosis not present

## 2019-03-23 DIAGNOSIS — R64 Cachexia: Secondary | ICD-10-CM | POA: Diagnosis not present

## 2019-03-23 DIAGNOSIS — Z9181 History of falling: Secondary | ICD-10-CM | POA: Diagnosis not present

## 2019-03-23 DIAGNOSIS — F0390 Unspecified dementia without behavioral disturbance: Secondary | ICD-10-CM | POA: Diagnosis not present

## 2019-03-29 DIAGNOSIS — G20 Parkinson's disease: Secondary | ICD-10-CM | POA: Diagnosis not present

## 2019-03-29 DIAGNOSIS — F0390 Unspecified dementia without behavioral disturbance: Secondary | ICD-10-CM | POA: Diagnosis not present

## 2019-03-29 DIAGNOSIS — Z9181 History of falling: Secondary | ICD-10-CM | POA: Diagnosis not present

## 2019-03-29 DIAGNOSIS — R64 Cachexia: Secondary | ICD-10-CM | POA: Diagnosis not present

## 2019-03-29 DIAGNOSIS — R1310 Dysphagia, unspecified: Secondary | ICD-10-CM | POA: Diagnosis not present

## 2019-03-29 DIAGNOSIS — G7000 Myasthenia gravis without (acute) exacerbation: Secondary | ICD-10-CM | POA: Diagnosis not present

## 2019-04-06 DIAGNOSIS — Z9181 History of falling: Secondary | ICD-10-CM | POA: Diagnosis not present

## 2019-04-06 DIAGNOSIS — F0390 Unspecified dementia without behavioral disturbance: Secondary | ICD-10-CM | POA: Diagnosis not present

## 2019-04-06 DIAGNOSIS — R1310 Dysphagia, unspecified: Secondary | ICD-10-CM | POA: Diagnosis not present

## 2019-04-06 DIAGNOSIS — G7000 Myasthenia gravis without (acute) exacerbation: Secondary | ICD-10-CM | POA: Diagnosis not present

## 2019-04-06 DIAGNOSIS — G20 Parkinson's disease: Secondary | ICD-10-CM | POA: Diagnosis not present

## 2019-04-06 DIAGNOSIS — R64 Cachexia: Secondary | ICD-10-CM | POA: Diagnosis not present

## 2019-04-10 DIAGNOSIS — I739 Peripheral vascular disease, unspecified: Secondary | ICD-10-CM | POA: Diagnosis not present

## 2019-04-10 DIAGNOSIS — G4700 Insomnia, unspecified: Secondary | ICD-10-CM | POA: Diagnosis not present

## 2019-04-10 DIAGNOSIS — G7000 Myasthenia gravis without (acute) exacerbation: Secondary | ICD-10-CM | POA: Diagnosis not present

## 2019-04-10 DIAGNOSIS — L0390 Cellulitis, unspecified: Secondary | ICD-10-CM | POA: Diagnosis not present

## 2019-04-11 DIAGNOSIS — G20 Parkinson's disease: Secondary | ICD-10-CM | POA: Diagnosis not present

## 2019-04-11 DIAGNOSIS — G7000 Myasthenia gravis without (acute) exacerbation: Secondary | ICD-10-CM | POA: Diagnosis not present

## 2019-04-11 DIAGNOSIS — I10 Essential (primary) hypertension: Secondary | ICD-10-CM | POA: Diagnosis not present

## 2019-04-11 DIAGNOSIS — Z9181 History of falling: Secondary | ICD-10-CM | POA: Diagnosis not present

## 2019-04-11 DIAGNOSIS — F0390 Unspecified dementia without behavioral disturbance: Secondary | ICD-10-CM | POA: Diagnosis not present

## 2019-04-11 DIAGNOSIS — R64 Cachexia: Secondary | ICD-10-CM | POA: Diagnosis not present

## 2019-04-11 DIAGNOSIS — R1310 Dysphagia, unspecified: Secondary | ICD-10-CM | POA: Diagnosis not present

## 2019-04-12 DIAGNOSIS — R64 Cachexia: Secondary | ICD-10-CM | POA: Diagnosis not present

## 2019-04-12 DIAGNOSIS — Z9181 History of falling: Secondary | ICD-10-CM | POA: Diagnosis not present

## 2019-04-12 DIAGNOSIS — G7000 Myasthenia gravis without (acute) exacerbation: Secondary | ICD-10-CM | POA: Diagnosis not present

## 2019-04-12 DIAGNOSIS — R1310 Dysphagia, unspecified: Secondary | ICD-10-CM | POA: Diagnosis not present

## 2019-04-12 DIAGNOSIS — F0390 Unspecified dementia without behavioral disturbance: Secondary | ICD-10-CM | POA: Diagnosis not present

## 2019-04-12 DIAGNOSIS — G20 Parkinson's disease: Secondary | ICD-10-CM | POA: Diagnosis not present

## 2019-04-17 DIAGNOSIS — G4700 Insomnia, unspecified: Secondary | ICD-10-CM | POA: Diagnosis not present

## 2019-04-17 DIAGNOSIS — L0390 Cellulitis, unspecified: Secondary | ICD-10-CM | POA: Diagnosis not present

## 2019-04-17 DIAGNOSIS — I739 Peripheral vascular disease, unspecified: Secondary | ICD-10-CM | POA: Diagnosis not present

## 2019-04-17 DIAGNOSIS — G7000 Myasthenia gravis without (acute) exacerbation: Secondary | ICD-10-CM | POA: Diagnosis not present

## 2019-04-19 DIAGNOSIS — R1310 Dysphagia, unspecified: Secondary | ICD-10-CM | POA: Diagnosis not present

## 2019-04-19 DIAGNOSIS — Z9181 History of falling: Secondary | ICD-10-CM | POA: Diagnosis not present

## 2019-04-19 DIAGNOSIS — G7000 Myasthenia gravis without (acute) exacerbation: Secondary | ICD-10-CM | POA: Diagnosis not present

## 2019-04-19 DIAGNOSIS — F0390 Unspecified dementia without behavioral disturbance: Secondary | ICD-10-CM | POA: Diagnosis not present

## 2019-04-19 DIAGNOSIS — G20 Parkinson's disease: Secondary | ICD-10-CM | POA: Diagnosis not present

## 2019-04-19 DIAGNOSIS — R64 Cachexia: Secondary | ICD-10-CM | POA: Diagnosis not present

## 2019-04-26 DIAGNOSIS — E039 Hypothyroidism, unspecified: Secondary | ICD-10-CM | POA: Diagnosis not present

## 2019-04-26 DIAGNOSIS — I10 Essential (primary) hypertension: Secondary | ICD-10-CM | POA: Diagnosis not present

## 2019-04-27 DIAGNOSIS — G7000 Myasthenia gravis without (acute) exacerbation: Secondary | ICD-10-CM | POA: Diagnosis not present

## 2019-04-27 DIAGNOSIS — R64 Cachexia: Secondary | ICD-10-CM | POA: Diagnosis not present

## 2019-04-27 DIAGNOSIS — R1310 Dysphagia, unspecified: Secondary | ICD-10-CM | POA: Diagnosis not present

## 2019-04-27 DIAGNOSIS — F0390 Unspecified dementia without behavioral disturbance: Secondary | ICD-10-CM | POA: Diagnosis not present

## 2019-04-27 DIAGNOSIS — G20 Parkinson's disease: Secondary | ICD-10-CM | POA: Diagnosis not present

## 2019-04-27 DIAGNOSIS — Z9181 History of falling: Secondary | ICD-10-CM | POA: Diagnosis not present

## 2019-05-04 DIAGNOSIS — R64 Cachexia: Secondary | ICD-10-CM | POA: Diagnosis not present

## 2019-05-04 DIAGNOSIS — Z9181 History of falling: Secondary | ICD-10-CM | POA: Diagnosis not present

## 2019-05-04 DIAGNOSIS — F0390 Unspecified dementia without behavioral disturbance: Secondary | ICD-10-CM | POA: Diagnosis not present

## 2019-05-04 DIAGNOSIS — G20 Parkinson's disease: Secondary | ICD-10-CM | POA: Diagnosis not present

## 2019-05-04 DIAGNOSIS — R1310 Dysphagia, unspecified: Secondary | ICD-10-CM | POA: Diagnosis not present

## 2019-05-04 DIAGNOSIS — G7000 Myasthenia gravis without (acute) exacerbation: Secondary | ICD-10-CM | POA: Diagnosis not present

## 2019-05-08 DIAGNOSIS — G7000 Myasthenia gravis without (acute) exacerbation: Secondary | ICD-10-CM | POA: Diagnosis not present

## 2019-05-08 DIAGNOSIS — I739 Peripheral vascular disease, unspecified: Secondary | ICD-10-CM | POA: Diagnosis not present

## 2019-05-08 DIAGNOSIS — I10 Essential (primary) hypertension: Secondary | ICD-10-CM | POA: Diagnosis not present

## 2019-05-08 DIAGNOSIS — G4700 Insomnia, unspecified: Secondary | ICD-10-CM | POA: Diagnosis not present

## 2019-05-11 DIAGNOSIS — Z9181 History of falling: Secondary | ICD-10-CM | POA: Diagnosis not present

## 2019-05-11 DIAGNOSIS — G20 Parkinson's disease: Secondary | ICD-10-CM | POA: Diagnosis not present

## 2019-05-11 DIAGNOSIS — R64 Cachexia: Secondary | ICD-10-CM | POA: Diagnosis not present

## 2019-05-11 DIAGNOSIS — R1310 Dysphagia, unspecified: Secondary | ICD-10-CM | POA: Diagnosis not present

## 2019-05-11 DIAGNOSIS — F0390 Unspecified dementia without behavioral disturbance: Secondary | ICD-10-CM | POA: Diagnosis not present

## 2019-05-11 DIAGNOSIS — G7000 Myasthenia gravis without (acute) exacerbation: Secondary | ICD-10-CM | POA: Diagnosis not present

## 2019-05-12 DIAGNOSIS — Z9181 History of falling: Secondary | ICD-10-CM | POA: Diagnosis not present

## 2019-05-12 DIAGNOSIS — F0390 Unspecified dementia without behavioral disturbance: Secondary | ICD-10-CM | POA: Diagnosis not present

## 2019-05-12 DIAGNOSIS — I10 Essential (primary) hypertension: Secondary | ICD-10-CM | POA: Diagnosis not present

## 2019-05-12 DIAGNOSIS — R64 Cachexia: Secondary | ICD-10-CM | POA: Diagnosis not present

## 2019-05-12 DIAGNOSIS — G7000 Myasthenia gravis without (acute) exacerbation: Secondary | ICD-10-CM | POA: Diagnosis not present

## 2019-05-12 DIAGNOSIS — R1310 Dysphagia, unspecified: Secondary | ICD-10-CM | POA: Diagnosis not present

## 2019-05-12 DIAGNOSIS — G20 Parkinson's disease: Secondary | ICD-10-CM | POA: Diagnosis not present

## 2019-05-15 DIAGNOSIS — I739 Peripheral vascular disease, unspecified: Secondary | ICD-10-CM | POA: Diagnosis not present

## 2019-05-15 DIAGNOSIS — G4700 Insomnia, unspecified: Secondary | ICD-10-CM | POA: Diagnosis not present

## 2019-05-15 DIAGNOSIS — I10 Essential (primary) hypertension: Secondary | ICD-10-CM | POA: Diagnosis not present

## 2019-05-15 DIAGNOSIS — G7000 Myasthenia gravis without (acute) exacerbation: Secondary | ICD-10-CM | POA: Diagnosis not present

## 2019-05-18 DIAGNOSIS — F0390 Unspecified dementia without behavioral disturbance: Secondary | ICD-10-CM | POA: Diagnosis not present

## 2019-05-18 DIAGNOSIS — R64 Cachexia: Secondary | ICD-10-CM | POA: Diagnosis not present

## 2019-05-18 DIAGNOSIS — Z9181 History of falling: Secondary | ICD-10-CM | POA: Diagnosis not present

## 2019-05-18 DIAGNOSIS — G7000 Myasthenia gravis without (acute) exacerbation: Secondary | ICD-10-CM | POA: Diagnosis not present

## 2019-05-18 DIAGNOSIS — R1310 Dysphagia, unspecified: Secondary | ICD-10-CM | POA: Diagnosis not present

## 2019-05-18 DIAGNOSIS — G20 Parkinson's disease: Secondary | ICD-10-CM | POA: Diagnosis not present

## 2019-05-23 DIAGNOSIS — E039 Hypothyroidism, unspecified: Secondary | ICD-10-CM | POA: Diagnosis not present

## 2019-05-23 DIAGNOSIS — I10 Essential (primary) hypertension: Secondary | ICD-10-CM | POA: Diagnosis not present

## 2019-05-25 DIAGNOSIS — R1310 Dysphagia, unspecified: Secondary | ICD-10-CM | POA: Diagnosis not present

## 2019-05-25 DIAGNOSIS — G7000 Myasthenia gravis without (acute) exacerbation: Secondary | ICD-10-CM | POA: Diagnosis not present

## 2019-05-25 DIAGNOSIS — Z9181 History of falling: Secondary | ICD-10-CM | POA: Diagnosis not present

## 2019-05-25 DIAGNOSIS — R64 Cachexia: Secondary | ICD-10-CM | POA: Diagnosis not present

## 2019-05-25 DIAGNOSIS — F0390 Unspecified dementia without behavioral disturbance: Secondary | ICD-10-CM | POA: Diagnosis not present

## 2019-05-25 DIAGNOSIS — G20 Parkinson's disease: Secondary | ICD-10-CM | POA: Diagnosis not present

## 2019-05-30 DIAGNOSIS — F0390 Unspecified dementia without behavioral disturbance: Secondary | ICD-10-CM | POA: Diagnosis not present

## 2019-05-30 DIAGNOSIS — R1310 Dysphagia, unspecified: Secondary | ICD-10-CM | POA: Diagnosis not present

## 2019-05-30 DIAGNOSIS — Z9181 History of falling: Secondary | ICD-10-CM | POA: Diagnosis not present

## 2019-05-30 DIAGNOSIS — R64 Cachexia: Secondary | ICD-10-CM | POA: Diagnosis not present

## 2019-05-30 DIAGNOSIS — G7000 Myasthenia gravis without (acute) exacerbation: Secondary | ICD-10-CM | POA: Diagnosis not present

## 2019-05-30 DIAGNOSIS — G20 Parkinson's disease: Secondary | ICD-10-CM | POA: Diagnosis not present

## 2019-06-01 DIAGNOSIS — F0390 Unspecified dementia without behavioral disturbance: Secondary | ICD-10-CM | POA: Diagnosis not present

## 2019-06-01 DIAGNOSIS — G7000 Myasthenia gravis without (acute) exacerbation: Secondary | ICD-10-CM | POA: Diagnosis not present

## 2019-06-01 DIAGNOSIS — G20 Parkinson's disease: Secondary | ICD-10-CM | POA: Diagnosis not present

## 2019-06-01 DIAGNOSIS — R1310 Dysphagia, unspecified: Secondary | ICD-10-CM | POA: Diagnosis not present

## 2019-06-01 DIAGNOSIS — Z9181 History of falling: Secondary | ICD-10-CM | POA: Diagnosis not present

## 2019-06-01 DIAGNOSIS — R64 Cachexia: Secondary | ICD-10-CM | POA: Diagnosis not present

## 2019-06-04 DIAGNOSIS — G20 Parkinson's disease: Secondary | ICD-10-CM | POA: Diagnosis not present

## 2019-06-04 DIAGNOSIS — G7000 Myasthenia gravis without (acute) exacerbation: Secondary | ICD-10-CM | POA: Diagnosis not present

## 2019-06-04 DIAGNOSIS — Z9181 History of falling: Secondary | ICD-10-CM | POA: Diagnosis not present

## 2019-06-04 DIAGNOSIS — R1310 Dysphagia, unspecified: Secondary | ICD-10-CM | POA: Diagnosis not present

## 2019-06-04 DIAGNOSIS — F0390 Unspecified dementia without behavioral disturbance: Secondary | ICD-10-CM | POA: Diagnosis not present

## 2019-06-04 DIAGNOSIS — R64 Cachexia: Secondary | ICD-10-CM | POA: Diagnosis not present

## 2019-06-05 DIAGNOSIS — Z9181 History of falling: Secondary | ICD-10-CM | POA: Diagnosis not present

## 2019-06-05 DIAGNOSIS — R1310 Dysphagia, unspecified: Secondary | ICD-10-CM | POA: Diagnosis not present

## 2019-06-05 DIAGNOSIS — I739 Peripheral vascular disease, unspecified: Secondary | ICD-10-CM | POA: Diagnosis not present

## 2019-06-05 DIAGNOSIS — R64 Cachexia: Secondary | ICD-10-CM | POA: Diagnosis not present

## 2019-06-05 DIAGNOSIS — F0390 Unspecified dementia without behavioral disturbance: Secondary | ICD-10-CM | POA: Diagnosis not present

## 2019-06-05 DIAGNOSIS — I10 Essential (primary) hypertension: Secondary | ICD-10-CM | POA: Diagnosis not present

## 2019-06-05 DIAGNOSIS — G7000 Myasthenia gravis without (acute) exacerbation: Secondary | ICD-10-CM | POA: Diagnosis not present

## 2019-06-05 DIAGNOSIS — G20 Parkinson's disease: Secondary | ICD-10-CM | POA: Diagnosis not present

## 2019-06-05 DIAGNOSIS — G4700 Insomnia, unspecified: Secondary | ICD-10-CM | POA: Diagnosis not present

## 2019-06-06 DIAGNOSIS — R64 Cachexia: Secondary | ICD-10-CM | POA: Diagnosis not present

## 2019-06-06 DIAGNOSIS — Z9181 History of falling: Secondary | ICD-10-CM | POA: Diagnosis not present

## 2019-06-06 DIAGNOSIS — R1310 Dysphagia, unspecified: Secondary | ICD-10-CM | POA: Diagnosis not present

## 2019-06-06 DIAGNOSIS — G20 Parkinson's disease: Secondary | ICD-10-CM | POA: Diagnosis not present

## 2019-06-06 DIAGNOSIS — F0390 Unspecified dementia without behavioral disturbance: Secondary | ICD-10-CM | POA: Diagnosis not present

## 2019-06-06 DIAGNOSIS — G7000 Myasthenia gravis without (acute) exacerbation: Secondary | ICD-10-CM | POA: Diagnosis not present

## 2019-06-07 DIAGNOSIS — R1310 Dysphagia, unspecified: Secondary | ICD-10-CM | POA: Diagnosis not present

## 2019-06-07 DIAGNOSIS — F0390 Unspecified dementia without behavioral disturbance: Secondary | ICD-10-CM | POA: Diagnosis not present

## 2019-06-07 DIAGNOSIS — R64 Cachexia: Secondary | ICD-10-CM | POA: Diagnosis not present

## 2019-06-07 DIAGNOSIS — G7000 Myasthenia gravis without (acute) exacerbation: Secondary | ICD-10-CM | POA: Diagnosis not present

## 2019-06-07 DIAGNOSIS — Z9181 History of falling: Secondary | ICD-10-CM | POA: Diagnosis not present

## 2019-06-07 DIAGNOSIS — G20 Parkinson's disease: Secondary | ICD-10-CM | POA: Diagnosis not present

## 2019-06-08 DIAGNOSIS — R64 Cachexia: Secondary | ICD-10-CM | POA: Diagnosis not present

## 2019-06-08 DIAGNOSIS — F0390 Unspecified dementia without behavioral disturbance: Secondary | ICD-10-CM | POA: Diagnosis not present

## 2019-06-08 DIAGNOSIS — Z9181 History of falling: Secondary | ICD-10-CM | POA: Diagnosis not present

## 2019-06-08 DIAGNOSIS — R1310 Dysphagia, unspecified: Secondary | ICD-10-CM | POA: Diagnosis not present

## 2019-06-08 DIAGNOSIS — G20 Parkinson's disease: Secondary | ICD-10-CM | POA: Diagnosis not present

## 2019-06-08 DIAGNOSIS — U071 COVID-19: Secondary | ICD-10-CM | POA: Diagnosis not present

## 2019-06-08 DIAGNOSIS — I739 Peripheral vascular disease, unspecified: Secondary | ICD-10-CM | POA: Diagnosis not present

## 2019-06-08 DIAGNOSIS — L84 Corns and callosities: Secondary | ICD-10-CM | POA: Diagnosis not present

## 2019-06-08 DIAGNOSIS — B351 Tinea unguium: Secondary | ICD-10-CM | POA: Diagnosis not present

## 2019-06-08 DIAGNOSIS — G7000 Myasthenia gravis without (acute) exacerbation: Secondary | ICD-10-CM | POA: Diagnosis not present

## 2019-06-12 DIAGNOSIS — G7000 Myasthenia gravis without (acute) exacerbation: Secondary | ICD-10-CM | POA: Diagnosis not present

## 2019-06-12 DIAGNOSIS — R1310 Dysphagia, unspecified: Secondary | ICD-10-CM | POA: Diagnosis not present

## 2019-06-12 DIAGNOSIS — G4700 Insomnia, unspecified: Secondary | ICD-10-CM | POA: Diagnosis not present

## 2019-06-12 DIAGNOSIS — Z9181 History of falling: Secondary | ICD-10-CM | POA: Diagnosis not present

## 2019-06-12 DIAGNOSIS — I739 Peripheral vascular disease, unspecified: Secondary | ICD-10-CM | POA: Diagnosis not present

## 2019-06-12 DIAGNOSIS — I10 Essential (primary) hypertension: Secondary | ICD-10-CM | POA: Diagnosis not present

## 2019-06-12 DIAGNOSIS — F0390 Unspecified dementia without behavioral disturbance: Secondary | ICD-10-CM | POA: Diagnosis not present

## 2019-06-12 DIAGNOSIS — G20 Parkinson's disease: Secondary | ICD-10-CM | POA: Diagnosis not present

## 2019-06-12 DIAGNOSIS — R64 Cachexia: Secondary | ICD-10-CM | POA: Diagnosis not present

## 2019-06-15 DIAGNOSIS — R1310 Dysphagia, unspecified: Secondary | ICD-10-CM | POA: Diagnosis not present

## 2019-06-15 DIAGNOSIS — G7000 Myasthenia gravis without (acute) exacerbation: Secondary | ICD-10-CM | POA: Diagnosis not present

## 2019-06-15 DIAGNOSIS — G20 Parkinson's disease: Secondary | ICD-10-CM | POA: Diagnosis not present

## 2019-06-15 DIAGNOSIS — R64 Cachexia: Secondary | ICD-10-CM | POA: Diagnosis not present

## 2019-06-15 DIAGNOSIS — F0390 Unspecified dementia without behavioral disturbance: Secondary | ICD-10-CM | POA: Diagnosis not present

## 2019-06-15 DIAGNOSIS — Z9181 History of falling: Secondary | ICD-10-CM | POA: Diagnosis not present

## 2019-06-22 DIAGNOSIS — R1310 Dysphagia, unspecified: Secondary | ICD-10-CM | POA: Diagnosis not present

## 2019-06-22 DIAGNOSIS — G20 Parkinson's disease: Secondary | ICD-10-CM | POA: Diagnosis not present

## 2019-06-22 DIAGNOSIS — Z9181 History of falling: Secondary | ICD-10-CM | POA: Diagnosis not present

## 2019-06-22 DIAGNOSIS — G7000 Myasthenia gravis without (acute) exacerbation: Secondary | ICD-10-CM | POA: Diagnosis not present

## 2019-06-22 DIAGNOSIS — F0390 Unspecified dementia without behavioral disturbance: Secondary | ICD-10-CM | POA: Diagnosis not present

## 2019-06-22 DIAGNOSIS — R64 Cachexia: Secondary | ICD-10-CM | POA: Diagnosis not present

## 2019-06-25 DIAGNOSIS — I10 Essential (primary) hypertension: Secondary | ICD-10-CM | POA: Diagnosis not present

## 2019-06-25 DIAGNOSIS — E039 Hypothyroidism, unspecified: Secondary | ICD-10-CM | POA: Diagnosis not present

## 2019-06-29 DIAGNOSIS — Z9181 History of falling: Secondary | ICD-10-CM | POA: Diagnosis not present

## 2019-06-29 DIAGNOSIS — R1310 Dysphagia, unspecified: Secondary | ICD-10-CM | POA: Diagnosis not present

## 2019-06-29 DIAGNOSIS — G20 Parkinson's disease: Secondary | ICD-10-CM | POA: Diagnosis not present

## 2019-06-29 DIAGNOSIS — R64 Cachexia: Secondary | ICD-10-CM | POA: Diagnosis not present

## 2019-06-29 DIAGNOSIS — F0390 Unspecified dementia without behavioral disturbance: Secondary | ICD-10-CM | POA: Diagnosis not present

## 2019-06-29 DIAGNOSIS — G7000 Myasthenia gravis without (acute) exacerbation: Secondary | ICD-10-CM | POA: Diagnosis not present

## 2019-07-03 DIAGNOSIS — G7000 Myasthenia gravis without (acute) exacerbation: Secondary | ICD-10-CM | POA: Diagnosis not present

## 2019-07-03 DIAGNOSIS — I739 Peripheral vascular disease, unspecified: Secondary | ICD-10-CM | POA: Diagnosis not present

## 2019-07-03 DIAGNOSIS — G4700 Insomnia, unspecified: Secondary | ICD-10-CM | POA: Diagnosis not present

## 2019-07-03 DIAGNOSIS — K219 Gastro-esophageal reflux disease without esophagitis: Secondary | ICD-10-CM | POA: Diagnosis not present

## 2019-07-06 DIAGNOSIS — G7000 Myasthenia gravis without (acute) exacerbation: Secondary | ICD-10-CM | POA: Diagnosis not present

## 2019-07-06 DIAGNOSIS — F0390 Unspecified dementia without behavioral disturbance: Secondary | ICD-10-CM | POA: Diagnosis not present

## 2019-07-06 DIAGNOSIS — G20 Parkinson's disease: Secondary | ICD-10-CM | POA: Diagnosis not present

## 2019-07-06 DIAGNOSIS — R1310 Dysphagia, unspecified: Secondary | ICD-10-CM | POA: Diagnosis not present

## 2019-07-06 DIAGNOSIS — R64 Cachexia: Secondary | ICD-10-CM | POA: Diagnosis not present

## 2019-07-06 DIAGNOSIS — Z9181 History of falling: Secondary | ICD-10-CM | POA: Diagnosis not present

## 2019-07-12 DIAGNOSIS — I10 Essential (primary) hypertension: Secondary | ICD-10-CM | POA: Diagnosis not present

## 2019-07-12 DIAGNOSIS — R64 Cachexia: Secondary | ICD-10-CM | POA: Diagnosis not present

## 2019-07-12 DIAGNOSIS — Z9181 History of falling: Secondary | ICD-10-CM | POA: Diagnosis not present

## 2019-07-12 DIAGNOSIS — R1310 Dysphagia, unspecified: Secondary | ICD-10-CM | POA: Diagnosis not present

## 2019-07-12 DIAGNOSIS — G7000 Myasthenia gravis without (acute) exacerbation: Secondary | ICD-10-CM | POA: Diagnosis not present

## 2019-07-12 DIAGNOSIS — F0390 Unspecified dementia without behavioral disturbance: Secondary | ICD-10-CM | POA: Diagnosis not present

## 2019-07-12 DIAGNOSIS — G20 Parkinson's disease: Secondary | ICD-10-CM | POA: Diagnosis not present

## 2019-07-13 DIAGNOSIS — R1310 Dysphagia, unspecified: Secondary | ICD-10-CM | POA: Diagnosis not present

## 2019-07-13 DIAGNOSIS — F0390 Unspecified dementia without behavioral disturbance: Secondary | ICD-10-CM | POA: Diagnosis not present

## 2019-07-13 DIAGNOSIS — Z9181 History of falling: Secondary | ICD-10-CM | POA: Diagnosis not present

## 2019-07-13 DIAGNOSIS — R64 Cachexia: Secondary | ICD-10-CM | POA: Diagnosis not present

## 2019-07-13 DIAGNOSIS — G7000 Myasthenia gravis without (acute) exacerbation: Secondary | ICD-10-CM | POA: Diagnosis not present

## 2019-07-13 DIAGNOSIS — G20 Parkinson's disease: Secondary | ICD-10-CM | POA: Diagnosis not present

## 2019-07-17 DIAGNOSIS — I739 Peripheral vascular disease, unspecified: Secondary | ICD-10-CM | POA: Diagnosis not present

## 2019-07-17 DIAGNOSIS — G4700 Insomnia, unspecified: Secondary | ICD-10-CM | POA: Diagnosis not present

## 2019-07-17 DIAGNOSIS — I10 Essential (primary) hypertension: Secondary | ICD-10-CM | POA: Diagnosis not present

## 2019-07-17 DIAGNOSIS — G7000 Myasthenia gravis without (acute) exacerbation: Secondary | ICD-10-CM | POA: Diagnosis not present

## 2019-07-20 DIAGNOSIS — Z9181 History of falling: Secondary | ICD-10-CM | POA: Diagnosis not present

## 2019-07-20 DIAGNOSIS — G20 Parkinson's disease: Secondary | ICD-10-CM | POA: Diagnosis not present

## 2019-07-20 DIAGNOSIS — R64 Cachexia: Secondary | ICD-10-CM | POA: Diagnosis not present

## 2019-07-20 DIAGNOSIS — F0390 Unspecified dementia without behavioral disturbance: Secondary | ICD-10-CM | POA: Diagnosis not present

## 2019-07-20 DIAGNOSIS — R1310 Dysphagia, unspecified: Secondary | ICD-10-CM | POA: Diagnosis not present

## 2019-07-20 DIAGNOSIS — G7000 Myasthenia gravis without (acute) exacerbation: Secondary | ICD-10-CM | POA: Diagnosis not present

## 2019-07-27 DIAGNOSIS — F0390 Unspecified dementia without behavioral disturbance: Secondary | ICD-10-CM | POA: Diagnosis not present

## 2019-07-27 DIAGNOSIS — G20 Parkinson's disease: Secondary | ICD-10-CM | POA: Diagnosis not present

## 2019-07-27 DIAGNOSIS — R1310 Dysphagia, unspecified: Secondary | ICD-10-CM | POA: Diagnosis not present

## 2019-07-27 DIAGNOSIS — G7000 Myasthenia gravis without (acute) exacerbation: Secondary | ICD-10-CM | POA: Diagnosis not present

## 2019-07-27 DIAGNOSIS — R64 Cachexia: Secondary | ICD-10-CM | POA: Diagnosis not present

## 2019-07-27 DIAGNOSIS — Z9181 History of falling: Secondary | ICD-10-CM | POA: Diagnosis not present

## 2019-07-31 DIAGNOSIS — I10 Essential (primary) hypertension: Secondary | ICD-10-CM | POA: Diagnosis not present

## 2019-07-31 DIAGNOSIS — G4700 Insomnia, unspecified: Secondary | ICD-10-CM | POA: Diagnosis not present

## 2019-07-31 DIAGNOSIS — I739 Peripheral vascular disease, unspecified: Secondary | ICD-10-CM | POA: Diagnosis not present

## 2019-07-31 DIAGNOSIS — G7000 Myasthenia gravis without (acute) exacerbation: Secondary | ICD-10-CM | POA: Diagnosis not present

## 2019-08-01 DIAGNOSIS — E785 Hyperlipidemia, unspecified: Secondary | ICD-10-CM | POA: Diagnosis not present

## 2019-08-01 DIAGNOSIS — Z79899 Other long term (current) drug therapy: Secondary | ICD-10-CM | POA: Diagnosis not present

## 2019-08-01 DIAGNOSIS — E039 Hypothyroidism, unspecified: Secondary | ICD-10-CM | POA: Diagnosis not present

## 2019-08-01 DIAGNOSIS — D649 Anemia, unspecified: Secondary | ICD-10-CM | POA: Diagnosis not present

## 2019-08-01 DIAGNOSIS — E7089 Other disorders of aromatic amino-acid metabolism: Secondary | ICD-10-CM | POA: Diagnosis not present

## 2019-08-03 DIAGNOSIS — F0390 Unspecified dementia without behavioral disturbance: Secondary | ICD-10-CM | POA: Diagnosis not present

## 2019-08-03 DIAGNOSIS — Z9181 History of falling: Secondary | ICD-10-CM | POA: Diagnosis not present

## 2019-08-03 DIAGNOSIS — G20 Parkinson's disease: Secondary | ICD-10-CM | POA: Diagnosis not present

## 2019-08-03 DIAGNOSIS — R1310 Dysphagia, unspecified: Secondary | ICD-10-CM | POA: Diagnosis not present

## 2019-08-03 DIAGNOSIS — G7000 Myasthenia gravis without (acute) exacerbation: Secondary | ICD-10-CM | POA: Diagnosis not present

## 2019-08-03 DIAGNOSIS — R64 Cachexia: Secondary | ICD-10-CM | POA: Diagnosis not present

## 2019-08-07 DIAGNOSIS — E781 Pure hyperglyceridemia: Secondary | ICD-10-CM | POA: Diagnosis not present

## 2019-08-07 DIAGNOSIS — E039 Hypothyroidism, unspecified: Secondary | ICD-10-CM | POA: Diagnosis not present

## 2019-08-07 DIAGNOSIS — D649 Anemia, unspecified: Secondary | ICD-10-CM | POA: Diagnosis not present

## 2019-08-12 DIAGNOSIS — F0390 Unspecified dementia without behavioral disturbance: Secondary | ICD-10-CM | POA: Diagnosis not present

## 2019-08-12 DIAGNOSIS — R64 Cachexia: Secondary | ICD-10-CM | POA: Diagnosis not present

## 2019-08-12 DIAGNOSIS — R1310 Dysphagia, unspecified: Secondary | ICD-10-CM | POA: Diagnosis not present

## 2019-08-12 DIAGNOSIS — I10 Essential (primary) hypertension: Secondary | ICD-10-CM | POA: Diagnosis not present

## 2019-08-12 DIAGNOSIS — Z9181 History of falling: Secondary | ICD-10-CM | POA: Diagnosis not present

## 2019-08-12 DIAGNOSIS — G20 Parkinson's disease: Secondary | ICD-10-CM | POA: Diagnosis not present

## 2019-08-12 DIAGNOSIS — G7000 Myasthenia gravis without (acute) exacerbation: Secondary | ICD-10-CM | POA: Diagnosis not present

## 2019-08-15 DIAGNOSIS — D649 Anemia, unspecified: Secondary | ICD-10-CM | POA: Diagnosis not present

## 2019-08-15 DIAGNOSIS — E781 Pure hyperglyceridemia: Secondary | ICD-10-CM | POA: Diagnosis not present

## 2019-08-15 DIAGNOSIS — G7000 Myasthenia gravis without (acute) exacerbation: Secondary | ICD-10-CM | POA: Diagnosis not present

## 2019-08-15 DIAGNOSIS — E039 Hypothyroidism, unspecified: Secondary | ICD-10-CM | POA: Diagnosis not present

## 2019-08-29 DIAGNOSIS — G20 Parkinson's disease: Secondary | ICD-10-CM | POA: Diagnosis not present

## 2019-08-29 DIAGNOSIS — Z9181 History of falling: Secondary | ICD-10-CM | POA: Diagnosis not present

## 2019-08-29 DIAGNOSIS — R1310 Dysphagia, unspecified: Secondary | ICD-10-CM | POA: Diagnosis not present

## 2019-08-29 DIAGNOSIS — F0390 Unspecified dementia without behavioral disturbance: Secondary | ICD-10-CM | POA: Diagnosis not present

## 2019-08-29 DIAGNOSIS — R64 Cachexia: Secondary | ICD-10-CM | POA: Diagnosis not present

## 2019-08-29 DIAGNOSIS — G7000 Myasthenia gravis without (acute) exacerbation: Secondary | ICD-10-CM | POA: Diagnosis not present

## 2019-08-30 DIAGNOSIS — G7000 Myasthenia gravis without (acute) exacerbation: Secondary | ICD-10-CM | POA: Diagnosis not present

## 2019-08-30 DIAGNOSIS — R1310 Dysphagia, unspecified: Secondary | ICD-10-CM | POA: Diagnosis not present

## 2019-08-30 DIAGNOSIS — Z9181 History of falling: Secondary | ICD-10-CM | POA: Diagnosis not present

## 2019-08-30 DIAGNOSIS — F0390 Unspecified dementia without behavioral disturbance: Secondary | ICD-10-CM | POA: Diagnosis not present

## 2019-08-30 DIAGNOSIS — R64 Cachexia: Secondary | ICD-10-CM | POA: Diagnosis not present

## 2019-08-30 DIAGNOSIS — G20 Parkinson's disease: Secondary | ICD-10-CM | POA: Diagnosis not present

## 2019-09-04 DIAGNOSIS — G7000 Myasthenia gravis without (acute) exacerbation: Secondary | ICD-10-CM | POA: Diagnosis not present

## 2019-09-04 DIAGNOSIS — G20 Parkinson's disease: Secondary | ICD-10-CM | POA: Diagnosis not present

## 2019-09-04 DIAGNOSIS — Z9181 History of falling: Secondary | ICD-10-CM | POA: Diagnosis not present

## 2019-09-04 DIAGNOSIS — R64 Cachexia: Secondary | ICD-10-CM | POA: Diagnosis not present

## 2019-09-04 DIAGNOSIS — F0390 Unspecified dementia without behavioral disturbance: Secondary | ICD-10-CM | POA: Diagnosis not present

## 2019-09-04 DIAGNOSIS — R1310 Dysphagia, unspecified: Secondary | ICD-10-CM | POA: Diagnosis not present

## 2019-09-05 DIAGNOSIS — Z9181 History of falling: Secondary | ICD-10-CM | POA: Diagnosis not present

## 2019-09-05 DIAGNOSIS — F0390 Unspecified dementia without behavioral disturbance: Secondary | ICD-10-CM | POA: Diagnosis not present

## 2019-09-05 DIAGNOSIS — R1310 Dysphagia, unspecified: Secondary | ICD-10-CM | POA: Diagnosis not present

## 2019-09-05 DIAGNOSIS — R64 Cachexia: Secondary | ICD-10-CM | POA: Diagnosis not present

## 2019-09-05 DIAGNOSIS — G7000 Myasthenia gravis without (acute) exacerbation: Secondary | ICD-10-CM | POA: Diagnosis not present

## 2019-09-05 DIAGNOSIS — G20 Parkinson's disease: Secondary | ICD-10-CM | POA: Diagnosis not present

## 2019-09-10 DIAGNOSIS — G7000 Myasthenia gravis without (acute) exacerbation: Secondary | ICD-10-CM | POA: Diagnosis not present

## 2019-09-10 DIAGNOSIS — Z9181 History of falling: Secondary | ICD-10-CM | POA: Diagnosis not present

## 2019-09-10 DIAGNOSIS — F0390 Unspecified dementia without behavioral disturbance: Secondary | ICD-10-CM | POA: Diagnosis not present

## 2019-09-10 DIAGNOSIS — R64 Cachexia: Secondary | ICD-10-CM | POA: Diagnosis not present

## 2019-09-10 DIAGNOSIS — G20 Parkinson's disease: Secondary | ICD-10-CM | POA: Diagnosis not present

## 2019-09-10 DIAGNOSIS — R1310 Dysphagia, unspecified: Secondary | ICD-10-CM | POA: Diagnosis not present

## 2019-09-11 DIAGNOSIS — E039 Hypothyroidism, unspecified: Secondary | ICD-10-CM | POA: Diagnosis not present

## 2019-09-11 DIAGNOSIS — G7000 Myasthenia gravis without (acute) exacerbation: Secondary | ICD-10-CM | POA: Diagnosis not present

## 2019-09-11 DIAGNOSIS — D649 Anemia, unspecified: Secondary | ICD-10-CM | POA: Diagnosis not present

## 2019-09-11 DIAGNOSIS — E781 Pure hyperglyceridemia: Secondary | ICD-10-CM | POA: Diagnosis not present

## 2019-11-25 ENCOUNTER — Other Ambulatory Visit: Payer: Self-pay

## 2019-11-25 ENCOUNTER — Encounter (HOSPITAL_BASED_OUTPATIENT_CLINIC_OR_DEPARTMENT_OTHER): Payer: Self-pay | Admitting: Emergency Medicine

## 2019-11-25 ENCOUNTER — Emergency Department (HOSPITAL_BASED_OUTPATIENT_CLINIC_OR_DEPARTMENT_OTHER)

## 2019-11-25 ENCOUNTER — Emergency Department (HOSPITAL_BASED_OUTPATIENT_CLINIC_OR_DEPARTMENT_OTHER)
Admission: EM | Admit: 2019-11-25 | Discharge: 2019-11-25 | Disposition: A | Discharge status: Home / Self Care | Attending: Emergency Medicine | Admitting: Emergency Medicine

## 2019-11-25 DIAGNOSIS — F0280 Dementia in other diseases classified elsewhere without behavioral disturbance: Secondary | ICD-10-CM | POA: Diagnosis not present

## 2019-11-25 DIAGNOSIS — Z79899 Other long term (current) drug therapy: Secondary | ICD-10-CM | POA: Insufficient documentation

## 2019-11-25 DIAGNOSIS — I10 Essential (primary) hypertension: Secondary | ICD-10-CM | POA: Diagnosis not present

## 2019-11-25 DIAGNOSIS — E785 Hyperlipidemia, unspecified: Secondary | ICD-10-CM | POA: Diagnosis not present

## 2019-11-25 DIAGNOSIS — G20 Parkinson's disease: Secondary | ICD-10-CM | POA: Diagnosis not present

## 2019-11-25 DIAGNOSIS — Y92129 Unspecified place in nursing home as the place of occurrence of the external cause: Secondary | ICD-10-CM | POA: Insufficient documentation

## 2019-11-25 DIAGNOSIS — T148XXA Other injury of unspecified body region, initial encounter: Secondary | ICD-10-CM

## 2019-11-25 DIAGNOSIS — S0181XA Laceration without foreign body of other part of head, initial encounter: Secondary | ICD-10-CM | POA: Insufficient documentation

## 2019-11-25 DIAGNOSIS — Y939 Activity, unspecified: Secondary | ICD-10-CM | POA: Insufficient documentation

## 2019-11-25 DIAGNOSIS — W19XXXA Unspecified fall, initial encounter: Secondary | ICD-10-CM | POA: Insufficient documentation

## 2019-11-25 DIAGNOSIS — Y999 Unspecified external cause status: Secondary | ICD-10-CM | POA: Diagnosis not present

## 2019-11-25 HISTORY — DX: Essential (primary) hypertension: I10

## 2019-11-25 MED ORDER — LIDOCAINE-EPINEPHRINE-TETRACAINE (LET) TOPICAL GEL
3.0000 mL | Freq: Once | TOPICAL | Status: AC
  Administered 2019-11-25: 08:00:00 3 mL via TOPICAL
  Filled 2019-11-25: qty 3

## 2019-11-25 MED ORDER — LIDOCAINE-EPINEPHRINE-TETRACAINE (LET) TOPICAL GEL
3.0000 mL | Freq: Once | TOPICAL | Status: AC
  Administered 2019-11-25: 09:00:00 3 mL via TOPICAL
  Filled 2019-11-25: qty 3

## 2019-11-25 MED ORDER — ONDANSETRON HCL 4 MG/2ML IJ SOLN: 4.0000 mg | Freq: Once | INTRAMUSCULAR | Status: DC

## 2019-11-25 NOTE — ED Notes (Signed)
Patient transported to CT 

## 2019-11-25 NOTE — ED Notes (Signed)
Call [facility] to transport  [phone]

## 2019-11-25 NOTE — ED Triage Notes (Addendum)
Pt arrives via EMS from [facility], fell from standing. Confused per norm with hx of dementia. EMS reports lac above L eye and skin tear to L elbow. No blood thinners. Pt has c collar in place.

## 2019-11-25 NOTE — Discharge Instructions (Addendum)
His work-up and exam today were overall reassuring with no evidence of skull fracture or neck fracture.  There was no bleeding inside the head.  We did repair the laceration after washout and his tetanus was up-to-date.  Please watch for signs and symptoms of infection and keep the wound clean.  The sutures are absorbable.  Please try to minimize tension on the area.  Please follow-up with his primary doctor for reassessment in several days.  If any symptoms change or worsen, please return to the nearest emergency department.

## 2019-11-25 NOTE — ED Provider Notes (Signed)
[facility] EMERGENCY DEPARTMENT Provider Note   CSN: [id number] Arrival date & time: 11/25/19  [id number]     History Chief Complaint  Patient presents with  . Fall    Lusiano Boeh is a 79 y.o. male.  The history is provided by the patient and medical records. No language interpreter was used.  Fall This is a new problem. The current episode started 1 to 2 hours ago. The problem occurs rarely. The problem has been resolved. Associated symptoms include headaches. Pertinent negatives include no chest pain, no abdominal pain and no shortness of breath. Nothing aggravates the symptoms. Nothing relieves the symptoms. He has tried nothing for the symptoms. The treatment provided no relief.       Past Medical History:  Diagnosis Date  . Dementia ([place])   . Dizziness   . GERD (gastroesophageal reflux disease)   . Hyperlipemia   . Hypertension   . Neuromuscular disorder ([place])    alzeimer, and parkinson per wife.  . Neuropathy   . Ptosis 2013  . Sleep apnea   . Thyroid disease   . Weakness     Patient Active Problem List   Diagnosis Date Noted  . Dementia without behavioral disturbance ([place]) 03/16/2018  . Parkinsonism ([place]) 07/28/2017  . Dementia ([place]) 02/09/2017  . Orthostatic hypotension 01/29/2016  . GERD (gastroesophageal reflux disease) 12/04/2015  . Hypothyroidism 12/04/2015  . Mild cognitive impairment 11/20/2015  . Dizziness   . Obstructive sleep apnea 01/28/2014  . 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 Diplopia 12/12/2013  . Ptosis 12/12/2013  . Gait difficulty 12/12/2013  . Weakness 12/12/2013  . Hyperlipemia   . Neuropathy     Past Surgical History:  Procedure Laterality Date  . CYST REMOVAL HAND    . LARYNX SURGERY    . THROAT SURGERY    . TONSILLECTOMY AND ADENOIDECTOMY    . TOTAL KNEE ARTHROPLASTY     Right       Family History  Problem Relation Age of Onset  . Seizures Mother   . Cancer - Other Father   .  Cancer - Other Maternal Grandmother     Social History   Tobacco Use  . Smoking status: Never Smoker  . Smokeless tobacco: Never Used  Substance Use Topics  . Alcohol use: No  . Drug use: No    Home Medications Prior to Admission medications   Medication Sig Start Date End Date Taking? Authorizing Provider  acetaminophen (TYLENOL) 500 MG tablet Take 500 mg by mouth. Takes daily at 0900 and prn q6h.    [provider]  atorvastatin (LIPITOR) 20 MG tablet Take 20 mg by mouth at bedtime.  03/29/17   [provider]  azelastine (ASTELIN) 0.1 % nasal spray Place 1 spray into both nostrils daily. Use in each nostril as directed    [provider]  carbidopa-levodopa (SINEMET IR) 25-100 MG tablet Take 2 tablets by mouth 3 (three) times daily. At 6am,11am, 16pm 07/28/17   Marcial Pacas, MD  cetirizine (ZYRTEC) 10 MG tablet Take 10 mg by mouth daily.    [provider]  Cholecalciferol (VITAMIN D3) 1000 units CAPS Take by mouth.    [provider]  diclofenac sodium (VOLTAREN) 1 % GEL as needed. 07/05/17   [provider]  donepezil (ARICEPT) 10 MG tablet Take 1 tablet (10 mg total) by mouth at bedtime. 02/09/17   Marcial Pacas, MD  levothyroxine (SYNTHROID, LEVOTHROID) 75 MCG tablet Take 1 tablet (75  mcg total) by mouth daily before breakfast. 01/04/17   Saguier, Percell Miller, PA-C  lisinopril (PRINIVIL,ZESTRIL) 2.5 MG tablet Take 2.5 mg by mouth daily. Hold for SBP <120    [provider]  memantine (NAMENDA) 10 MG tablet Take 1 tablet (10 mg total) by mouth 2 (two) times daily. 07/28/17   Marcial Pacas, MD  Menthol, Topical Analgesic, (BIOFREEZE EX) Apply topically 3 (three) times daily.    [provider]  Multiple Vitamins-Minerals (MULTIVITAMIN PO) Take by mouth daily.    [provider]  pantoprazole (PROTONIX) 40 MG tablet Take 40 mg by mouth daily. 03/29/17   [provider]  Polyethyl Glycol-Propyl Glycol (SYSTANE)  0.4-0.3 % GEL ophthalmic gel Place 1 application into both eyes 3 (three) times daily.     [provider]  sertraline (ZOLOFT) 50 MG tablet Take 2 tablets (100 mg total) by mouth daily. 03/16/18   Marcial Pacas, MD  Vitamin D, Ergocalciferol, (DRISDOL) 50000 units CAPS capsule Take 50,000 Units by mouth every 30 (thirty) days.    [provider]  Wheat Dextrin (BENEFIBER) POWD Take by mouth daily.    [provider]    Allergies    Patient has no known allergies.  Review of Systems   Review of Systems  Unable to perform ROS: Dementia  Constitutional: Negative for chills, fatigue and fever.  HENT: Negative for congestion.   Eyes: Negative for visual disturbance.  Respiratory: Negative for cough, chest tightness, shortness of breath and wheezing.   Cardiovascular: Negative for chest pain and palpitations.  Gastrointestinal: Negative for abdominal pain, constipation, diarrhea, nausea and vomiting.  Genitourinary: Negative for dysuria and frequency.  Musculoskeletal: Negative for back pain, neck pain and neck stiffness.  Skin: Positive for wound. Negative for rash.  Neurological: Positive for headaches.  Psychiatric/Behavioral: Negative for agitation and confusion.  All other systems reviewed and are negative.   Physical Exam Updated Vital Signs BP 121/62 (BP Location: Right Arm)   Pulse 76   Temp 97.6 F (36.4 C) (Oral)   Resp 20   SpO2 99%   Physical Exam Vitals and nursing note reviewed.  Constitutional:      General: He is not in acute distress.    Appearance: He is well-developed. He is not ill-appearing, toxic-appearing or diaphoretic.  HENT:     Head: Laceration present.      Nose: No congestion or rhinorrhea.     Mouth/Throat:     Mouth: Mucous membranes are moist.     Pharynx: No oropharyngeal exudate or posterior oropharyngeal erythema.  Eyes:     Extraocular Movements: Extraocular movements intact.     Conjunctiva/sclera: Conjunctivae  normal.     Pupils: Pupils are equal, round, and reactive to light.  Neck:     Comments: C collar in place Cardiovascular:     Rate and Rhythm: Normal rate and regular rhythm.     Pulses: Normal pulses.     Heart sounds: No murmur.  Pulmonary:     Effort: Pulmonary effort is normal. No respiratory distress.     Breath sounds: Normal breath sounds. No stridor. No wheezing, rhonchi or rales.  Chest:     Chest wall: No tenderness.  Abdominal:     General: Abdomen is flat. There is no distension.     Palpations: Abdomen is soft.     Tenderness: There is no abdominal tenderness. There is no right CVA tenderness, left CVA tenderness, guarding or rebound.  Musculoskeletal:  General: Signs of injury present. No tenderness.       Arms:     Cervical back: Neck supple. No tenderness.     Right lower leg: No edema.     Left lower leg: No edema.       Legs:  Skin:    General: Skin is warm and dry.     Capillary Refill: Capillary refill takes less than 2 seconds.     Findings: No erythema.  Neurological:     General: No focal deficit present.     Mental Status: He is alert.     Cranial Nerves: No cranial nerve deficit.     Sensory: No sensory deficit.     Motor: No weakness.  Psychiatric:        Mood and Affect: Mood normal.     ED Results / Procedures / Treatments   Labs (all labs ordered are listed, but only abnormal results are displayed) Labs Reviewed - No data to display  EKG None  Radiology CT Head Wo Contrast  Result Date: 11/25/2019 CLINICAL DATA:  Fall in bathroom. EXAM: CT HEAD WITHOUT CONTRAST CT CERVICAL SPINE WITHOUT CONTRAST TECHNIQUE: Multidetector CT imaging of the head and cervical spine was performed following the standard protocol without intravenous contrast. Multiplanar CT image reconstructions of the cervical spine were also generated. COMPARISON:  MRI 02/24/2017 FINDINGS: CT HEAD FINDINGS Brain: No evidence of acute infarction, hemorrhage,  hydrocephalus, extra-axial collection or mass lesion/mass effect. There is mild diffuse low-attenuation within the subcortical and periventricular white matter compatible with chronic microvascular disease. Prominence of the sulci and ventricles identified compatible with brain atrophy. Vascular: No hyperdense vessel or unexpected calcification. Skull: Normal. Negative for fracture or focal lesion. Sinuses/Orbits: Mild mucosal thickening involving the right side of the sphenoid sinus is identified. Remaining paranasal sinuses and mastoid air cells are clear. Other: Left frontal scalp laceration. CT CERVICAL SPINE FINDINGS Alignment: Normal. Skull base and vertebrae: No acute fracture. No primary bone lesion or focal pathologic process. Soft tissues and spinal canal: No prevertebral fluid or swelling. No visible canal hematoma. Disc levels: Mild multi level endplate spurring is noted with disc space narrowing. Upper chest: No acute abnormality. Other: None IMPRESSION: 1. No acute intracranial abnormalities. 2. Chronic small vessel ischemic disease and brain atrophy. 3. No evidence for cervical spine fracture or dislocation. 4. Mild cervical degenerative disc disease. Electronically Signed   By: Kerby Moors M.D.   On: 11/25/2019 08:33   CT Cervical Spine Wo Contrast  Result Date: 11/25/2019 CLINICAL DATA:  Fall in bathroom. EXAM: CT HEAD WITHOUT CONTRAST CT CERVICAL SPINE WITHOUT CONTRAST TECHNIQUE: Multidetector CT imaging of the head and cervical spine was performed following the standard protocol without intravenous contrast. Multiplanar CT image reconstructions of the cervical spine were also generated. COMPARISON:  MRI 02/24/2017 FINDINGS: CT HEAD FINDINGS Brain: No evidence of acute infarction, hemorrhage, hydrocephalus, extra-axial collection or mass lesion/mass effect. There is mild diffuse low-attenuation within the subcortical and periventricular white matter compatible with chronic microvascular  disease. Prominence of the sulci and ventricles identified compatible with brain atrophy. Vascular: No hyperdense vessel or unexpected calcification. Skull: Normal. Negative for fracture or focal lesion. Sinuses/Orbits: Mild mucosal thickening involving the right side of the sphenoid sinus is identified. Remaining paranasal sinuses and mastoid air cells are clear. Other: Left frontal scalp laceration. CT CERVICAL SPINE FINDINGS Alignment: Normal. Skull base and vertebrae: No acute fracture. No primary bone lesion or focal pathologic process. Soft tissues and spinal canal: No  prevertebral fluid or swelling. No visible canal hematoma. Disc levels: Mild multi level endplate spurring is noted with disc space narrowing. Upper chest: No acute abnormality. Other: None IMPRESSION: 1. No acute intracranial abnormalities. 2. Chronic small vessel ischemic disease and brain atrophy. 3. No evidence for cervical spine fracture or dislocation. 4. Mild cervical degenerative disc disease. Electronically Signed   By: Kerby Moors M.D.   On: 11/25/2019 08:33    Procedures .Marland KitchenLaceration Repair  Date/Time: 11/25/2019 10:17 AM Performed by: Courtney Paris, MD Authorized by: Courtney Paris, MD   Consent:    Consent obtained:  Verbal   Consent given by:  Patient   Risks discussed:  Infection, pain, need for additional repair and poor cosmetic result   Alternatives discussed:  No treatment Anesthesia (see MAR for exact dosages):    Anesthesia method:  Topical application   Topical anesthetic:  LET Laceration details:    Location:  Face   Face location:  Forehead   Length (cm):  2   Depth (mm):  2 Repair type:    Repair type:  Simple Pre-procedure details:    Preparation:  Patient was prepped and draped in usual sterile fashion and imaging obtained to evaluate for foreign bodies Exploration:    Hemostasis achieved with:  Direct pressure   Wound exploration: wound explored through full range of  motion and entire depth of wound probed and visualized     Contaminated: no   Treatment:    Area cleansed with:  Saline   Amount of cleaning:  Standard   Irrigation solution:  Sterile saline   Irrigation method:  Syringe   Visualized foreign bodies/material removed: no   Skin repair:    Repair method:  Sutures   Suture size:  5-0   Wound skin closure material used: vicryl rapide.   (including critical care time)  Medications Ordered in ED Medications  lidocaine-EPINEPHrine-tetracaine (LET) topical gel (3 mLs Topical Given 11/25/19 0753)  lidocaine-EPINEPHrine-tetracaine (LET) topical gel (3 mLs Topical Given 11/25/19 [id number])    ED Course  I have reviewed the triage vital signs and the nursing notes.  Pertinent labs & imaging results that were available during my care of the patient were reviewed by me and considered in my medical decision making (see chart for details).    MDM Rules/Calculators/A&P                      Torreon Musick is a 79 y.o. male with a past medical history significant for Alzheimer's dementia, GERD, hypothyroidism, recent Covid infection currently on antibiotics for pneumonia who is on hospice who presents for a fall.  According to daughter who provided information, patient had a fall this morning that was unwitnessed at his nursing facility.  She feels that the patient was allowed to be in the bathroom without assistance in a chair and was found down on the ground after a fall.  Patient sustained a small laceration to his left eyebrow and abrasion/skin tear to his left elbow.  He is also having some pain in his right knee.  Although he is currently fighting pneumonia, he denies any chest pain, shortness of breath, cough, congestion, abdominal pain, nausea, vomiting, urinary symptoms or GI symptoms.  He is at his mental status baseline according to the daughter who accompanies patient.  On exam, patient has a 2 similar laceration to his left eyebrow/forehead.  It is  hemostatic.  He has normal extraocular movements and minimal tenderness in  the location.  Normal sensation and symmetric smile of the face.  Oropharyngeal exam unremarkable.  No nasal septal hematoma seen.  Patient arrived in a cervical immobilization collar.  Chest is nontender and lungs clear.  Abdomen nontender.  Patient moving all extremities.  Patient has abrasion to his right knee with minimal tenderness.  He has chronic contractures of both knees.  He has a 1 cm skin tear to the left elbow that is hemostatic.  Good strength sensation and pulses in his upper extremities.  Had a shared decision-making conversation with the patient and daughter and we agreed to get CT head and neck and then repair the laceration.  As he is at his mental status baseline and is currently in hospice, they do not want to do extensive labs or other imaging as this appears to be a simple fall from a chair when he was unsupervised allegedly.  We will get the head CT, neck CT, and I will have the let gel applied to the patient's forehead.  Anticipate laceration repair and discharge home after work-up.  He is up-to-date from his tetanus shot and they did not want further imaging of his knee as he was able to bend it his normal amount.  Anticipate discharge after laceration repair and imaging.  CT imaging or no no skull fracture or intracranial hemorrhage.  C-spine showed no fracture dislocation.  Wound was repaired without difficulty with absorbable sutures after discussion with daughter who is a former surgical nurse.  Patient will follow up with PCP and will monitor his wound for infection.  She agreed with plan of care and patient was discharged back to his facility for outpatient follow-up.   Final Clinical Impression(s) / ED Diagnoses Final diagnoses:  Fall, initial encounter  Abrasion  Laceration of forehead, initial encounter    Rx / DC Orders ED Discharge Orders    None     Clinical Impression: 1.  Fall, initial encounter   2. Abrasion   3. Laceration of forehead, initial encounter     Disposition: Discharge  Condition: Good  I have discussed the results, Dx and Tx plan with the pt(& family if present). He/she/they expressed understanding and agree(s) with the plan. Discharge instructions discussed at great length. Strict return precautions discussed and pt &/or family have verbalized understanding of the instructions. No further questions at time of discharge.    New Prescriptions   No medications on file    Follow Up: His PCP     [facility] [address] [id number] [facility] [address] [place] [phone]       Elara Cocke, Gwenyth Allegra, MD 11/25/19 1025

## 2020-01-10 DEATH — deceased
